# Patient Record
Sex: Male | Born: 1950 | Race: White | Hispanic: No | Marital: Single | Smoking: Former smoker
Health system: Southern US, Community
[De-identification: ages and names within clinical notes are randomized; demographics above are authoritative.]

## PROBLEM LIST (undated history)

## (undated) DIAGNOSIS — I10 Essential (primary) hypertension: Secondary | ICD-10-CM

## (undated) DIAGNOSIS — F209 Schizophrenia, unspecified: Secondary | ICD-10-CM

## (undated) DIAGNOSIS — B1920 Unspecified viral hepatitis C without hepatic coma: Secondary | ICD-10-CM

## (undated) DIAGNOSIS — F32A Depression, unspecified: Secondary | ICD-10-CM

## (undated) DIAGNOSIS — R413 Other amnesia: Secondary | ICD-10-CM

## (undated) DIAGNOSIS — F329 Major depressive disorder, single episode, unspecified: Secondary | ICD-10-CM

## (undated) DIAGNOSIS — F99 Mental disorder, not otherwise specified: Secondary | ICD-10-CM

## (undated) DIAGNOSIS — F319 Bipolar disorder, unspecified: Secondary | ICD-10-CM

## (undated) DIAGNOSIS — F259 Schizoaffective disorder, unspecified: Secondary | ICD-10-CM

## (undated) DIAGNOSIS — R569 Unspecified convulsions: Secondary | ICD-10-CM

## (undated) HISTORY — DX: Other amnesia: R41.3

---

## 2002-02-14 ENCOUNTER — Inpatient Hospital Stay (HOSPITAL_COMMUNITY): Admission: EM | Admit: 2002-02-14 | Discharge: 2002-02-26 | Payer: Self-pay | Admitting: Psychiatry

## 2002-06-05 ENCOUNTER — Inpatient Hospital Stay (HOSPITAL_COMMUNITY): Admission: EM | Admit: 2002-06-05 | Discharge: 2002-06-08 | Payer: Self-pay | Admitting: Psychiatry

## 2010-03-12 ENCOUNTER — Emergency Department: Payer: Self-pay | Admitting: Internal Medicine

## 2010-03-21 ENCOUNTER — Emergency Department: Payer: Self-pay | Admitting: Emergency Medicine

## 2010-03-22 ENCOUNTER — Emergency Department: Payer: Self-pay | Admitting: Emergency Medicine

## 2011-06-09 ENCOUNTER — Emergency Department (HOSPITAL_COMMUNITY)
Admission: EM | Admit: 2011-06-09 | Discharge: 2011-06-09 | Disposition: A | Discharge status: Home / Self Care | Payer: Medicare Other | Attending: Emergency Medicine | Admitting: Emergency Medicine

## 2011-06-09 DIAGNOSIS — F329 Major depressive disorder, single episode, unspecified: Secondary | ICD-10-CM | POA: Insufficient documentation

## 2011-06-09 DIAGNOSIS — F3289 Other specified depressive episodes: Secondary | ICD-10-CM | POA: Insufficient documentation

## 2011-06-09 LAB — COMPREHENSIVE METABOLIC PANEL
ALT: 40 U/L (ref 0–53)
Alkaline Phosphatase: 73 U/L (ref 39–117)
CO2: 23 mEq/L (ref 19–32)
Chloride: 103 mEq/L (ref 96–112)
GFR calc Af Amer: >90 mL/min (ref >90)
GFR calc non Af Amer: 90 mL/min — ABNORMAL LOW (ref >90)
Glucose, Bld: 107 mg/dL — ABNORMAL HIGH (ref 70–99)
Potassium: 3.5 mEq/L (ref 3.5–5.1)
Sodium: 137 mEq/L (ref 135–145)
Total Protein: 8 g/dL (ref 6.0–8.3)

## 2011-06-09 LAB — CBC
Hemoglobin: 14.2 g/dL (ref 13.0–17.0)
RBC: 4.39 MIL/uL (ref 4.22–5.81)
WBC: 9 10*3/uL (ref 4.0–10.5)

## 2011-06-09 LAB — DIFFERENTIAL
Basophils Absolute: 0 10*3/uL (ref 0.0–0.1)
Basophils Relative: 0 % (ref 0–1)
Neutro Abs: 6.4 10*3/uL (ref 1.7–7.7)
Neutrophils Relative %: 72 % (ref 43–77)

## 2011-06-19 ENCOUNTER — Emergency Department (HOSPITAL_COMMUNITY): Payer: Medicare Other

## 2011-06-19 ENCOUNTER — Observation Stay (HOSPITAL_COMMUNITY)
Admission: EM | Admit: 2011-06-19 | Discharge: 2011-06-20 | Disposition: A | Discharge status: Home / Self Care | Payer: Medicare Other | Attending: Emergency Medicine | Admitting: Emergency Medicine

## 2011-06-19 DIAGNOSIS — R079 Chest pain, unspecified: Principal | ICD-10-CM | POA: Insufficient documentation

## 2011-06-19 LAB — COMPREHENSIVE METABOLIC PANEL
Albumin: 3.9 g/dL (ref 3.5–5.2)
BUN: 11 mg/dL (ref 6–23)
Chloride: 109 mEq/L (ref 96–112)
Creatinine, Ser: 0.92 mg/dL (ref 0.50–1.35)
GFR calc Af Amer: >90 mL/min (ref >90)
GFR calc non Af Amer: 90 mL/min — ABNORMAL LOW (ref >90)
Total Bilirubin: 0.4 mg/dL (ref 0.3–1.2)

## 2011-06-19 LAB — CBC
Hemoglobin: 12.7 g/dL — ABNORMAL LOW (ref 13.0–17.0)
MCH: 31.3 pg (ref 26.0–34.0)
MCHC: 34.1 g/dL (ref 30.0–36.0)

## 2011-06-19 LAB — POCT I-STAT TROPONIN I: Troponin i, poc: 0.01 ng/mL (ref 0.00–0.08)

## 2011-06-19 LAB — D-DIMER, QUANTITATIVE: D-Dimer, Quant: 0.67 ug/mL-FEU — ABNORMAL HIGH (ref 0.00–0.48)

## 2011-06-20 ENCOUNTER — Encounter (HOSPITAL_COMMUNITY): Payer: Self-pay | Admitting: Radiology

## 2011-06-20 ENCOUNTER — Observation Stay (HOSPITAL_COMMUNITY): Payer: Medicare Other

## 2011-06-20 DIAGNOSIS — R079 Chest pain, unspecified: Secondary | ICD-10-CM

## 2011-06-20 LAB — CK TOTAL AND CKMB (NOT AT ARMC): Total CK: 171 U/L (ref 7–232)

## 2011-06-20 LAB — TROPONIN I: Troponin I: <0.3 ng/mL (ref <0.30)

## 2011-06-20 MED ORDER — IOHEXOL 350 MG/ML SOLN
100.0000 mL | Freq: Once | INTRAVENOUS | Status: AC | PRN
  Administered 2011-06-20: 100 mL via INTRAVENOUS

## 2011-06-24 NOTE — Consult Note (Signed)
Daryl Campbell, Daryl Campbell NO.:  [id number]  MEDICAL RECORD NO.:  [id number]  LOCATION:  1899                         FACILITY:  [facility]  PHYSICIAN:  Jake Bathe, MD      DATE OF BIRTH:  1951-08-17  DATE OF CONSULTATION:  06/20/2011 DATE OF DISCHARGE:                                CONSULTATION   REASON FOR CONSULTATION:  Evaluation of chest pain.  HISTORY OF PRESENT ILLNESS:  Daryl Campbell is a 60 year old male whose father had a myocardial infarction at age 46 with a underlying seizure disorder, smoker, nondiabetic, no hypertension who reported to the emergency division last evening after having episodic chest discomfort lasting approximately 15 minutes in duration while tossing and turning in bed that was sharp in character and localized to the substernal region.  He underwent the routine CDU protocol and had normal cardiac markers and went to the stress echo lab where he was unable to perform the treadmill test due to issues with coordination.  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 His current condition is back to his usual state of health.  He is not complaining of any discomfort or any chest discomfort at this time.  He has no fevers, chills, nausea, and vomiting.  He did admit to smoking a pack a day of cigarettes which he admits to going to try to quit. Oxygen saturations were normal.  PAST MEDICAL HISTORY:  As above.  MEDICATIONS:  Klonopin, Tegretol, and Zoloft.  FAMILY HISTORY:  Father MI, 51.  SOCIAL HISTORY:  Smoker, pack a day.  No alcohol.  No drug use.  Lives in nursing facility.  REVIEW OF SYSTEMS:  Unless specified above, all other 12 review of systems negative.  PHYSICAL EXAMINATION:  VITAL SIGNS:  Blood pressure 105/71, pulse 64, respirations 18, saturating 96% on room air. GENERAL:  Alert and oriented x3.  No acute distress, pleasant, jovial, and hungry. EYES:  Well-perfused  conjunctivae.  EOMI.  No scleral icterus. NECK:  Supple.  No lymphadenopathy.  No thyromegaly.  No carotid bruits. No JVD. CARDIOVASCULAR:  Regular rate and rhythm without any murmurs, rubs, or gallops. LUNGS:  Clear to auscultation bilaterally.  Normal respiratory effort. ABDOMEN:  Soft, nontender, and normoactive bowel sounds.  No rebound or guarding. EXTREMITIES:  No clubbing, cyanosis, or edema.  Normal distal pulses. GU:  Deferred. RECTAL:  Deferred. NEUROLOGIC:  Nonfocal.  No tremors are noted. PSYCH:  Appears quite pleasant, alert, understands condition.  LABORATORY DATA:  EKG personally viewed shows sinus rhythm, rate 65 with subtle nonspecific T-wave inversion in lead V4, V3, and some flattening in V5 as well as inferior leads.  When compared to EKG yesterday, there was flattening of ST segments and/or T-waves in V2 and V3, but no subtle inversion.  These T-wave changes are very nonspecific and do not appear to be ischemic in origin.  First-degree AV block is noted.  PR interval 224 on second EKG with a heart rate of 50.  CTA of chest was done to rule out PE and there was no pulmonary embolus but there was some coronary artery calcifications seen in  the circumflex artery as well as likely LAD.  All cardiac markers are negative.  Chemistry is unremarkable.  D-dimer was elevated prompting exam.  Hemoglobin mildly low at 12.7, platelet count 231, and creatinine 0.9.  Liver functions are normal.  ASSESSMENT AND PLAN:  This is a 60 year old male with family history of coronary artery disease, smoker with atypical chest discomfort. 1. Atypical chest discomfort - this was occurring while turning in his     bed last night.  He did push mowed the lawn yesterday without any     difficulty.  I wonder if there is a musculoskeletal component to     this.  His cardiac markers are negative and reassuring.  EKG is     reassuring.  Subtle nonspecific ST changes.  He does, however, have      coronary calcium on his CT of the chest.  I would like to see him     back in the clinic to monitor his symptoms and to determine whether     or not he needs any further noninvasive imaging stress test.  Of     course, he knows to contact us if any further worrisome symptoms     develop.  At this point, I do feel comfortable allowing him go home     with tobacco cessation, aspirin, and continue to modify all risk     factors.  Once again, troponin is negative.  He is eager to go home     as well.  We will see him back in followup in approximately 1 week.     Jake Bathe, MD     MCS/MEDQ  D:  06/20/2011  T:  06/20/2011  Job:  [id number]  Electronically Signed by Donato Schultz MD on 06/24/2011 07:58:39 AM

## 2011-09-20 ENCOUNTER — Encounter (HOSPITAL_COMMUNITY): Payer: Self-pay | Admitting: *Deleted

## 2011-09-20 ENCOUNTER — Emergency Department (HOSPITAL_COMMUNITY): Payer: Medicare Other

## 2011-09-20 ENCOUNTER — Inpatient Hospital Stay (HOSPITAL_COMMUNITY)
Admission: EM | Admit: 2011-09-20 | Discharge: 2011-09-24 | DRG: 101 | Payer: Medicare Other | Attending: Infectious Diseases | Admitting: Infectious Diseases

## 2011-09-20 ENCOUNTER — Other Ambulatory Visit: Payer: Self-pay

## 2011-09-20 ENCOUNTER — Emergency Department (HOSPITAL_COMMUNITY)
Admission: EM | Admit: 2011-09-20 | Discharge: 2011-09-20 | Disposition: A | Discharge status: Home / Self Care | Payer: Medicare Other | Source: Home / Self Care | Attending: Emergency Medicine | Admitting: Emergency Medicine

## 2011-09-20 DIAGNOSIS — Z79899 Other long term (current) drug therapy: Secondary | ICD-10-CM

## 2011-09-20 DIAGNOSIS — Z7982 Long term (current) use of aspirin: Secondary | ICD-10-CM | POA: Insufficient documentation

## 2011-09-20 DIAGNOSIS — I10 Essential (primary) hypertension: Secondary | ICD-10-CM | POA: Insufficient documentation

## 2011-09-20 DIAGNOSIS — F319 Bipolar disorder, unspecified: Secondary | ICD-10-CM | POA: Diagnosis present

## 2011-09-20 DIAGNOSIS — Z9119 Patient's noncompliance with other medical treatment and regimen: Secondary | ICD-10-CM

## 2011-09-20 DIAGNOSIS — IMO0001 Reserved for inherently not codable concepts without codable children: Secondary | ICD-10-CM | POA: Insufficient documentation

## 2011-09-20 DIAGNOSIS — Z6828 Body mass index (BMI) 28.0-28.9, adult: Secondary | ICD-10-CM

## 2011-09-20 DIAGNOSIS — R569 Unspecified convulsions: Secondary | ICD-10-CM

## 2011-09-20 DIAGNOSIS — Z91199 Patient's noncompliance with other medical treatment and regimen due to unspecified reason: Secondary | ICD-10-CM

## 2011-09-20 DIAGNOSIS — R4182 Altered mental status, unspecified: Secondary | ICD-10-CM

## 2011-09-20 DIAGNOSIS — G40909 Epilepsy, unspecified, not intractable, without status epilepticus: Principal | ICD-10-CM | POA: Diagnosis present

## 2011-09-20 DIAGNOSIS — T65891A Toxic effect of other specified substances, accidental (unintentional), initial encounter: Secondary | ICD-10-CM | POA: Insufficient documentation

## 2011-09-20 DIAGNOSIS — F209 Schizophrenia, unspecified: Secondary | ICD-10-CM | POA: Diagnosis present

## 2011-09-20 DIAGNOSIS — E875 Hyperkalemia: Secondary | ICD-10-CM | POA: Diagnosis present

## 2011-09-20 DIAGNOSIS — F203 Undifferentiated schizophrenia: Secondary | ICD-10-CM

## 2011-09-20 DIAGNOSIS — B1920 Unspecified viral hepatitis C without hepatic coma: Secondary | ICD-10-CM | POA: Diagnosis present

## 2011-09-20 HISTORY — DX: Essential (primary) hypertension: I10

## 2011-09-20 HISTORY — DX: Mental disorder, not otherwise specified: F99

## 2011-09-20 HISTORY — DX: Unspecified convulsions: R56.9

## 2011-09-20 HISTORY — DX: Unspecified viral hepatitis C without hepatic coma: B19.20

## 2011-09-20 LAB — CBC
HCT: 44.1 % (ref 39.0–52.0)
Hemoglobin: 15.2 g/dL (ref 13.0–17.0)
MCH: 32.1 pg (ref 26.0–34.0)
MCHC: 34.5 g/dL (ref 30.0–36.0)
MCV: 93.2 fL (ref 78.0–100.0)

## 2011-09-20 LAB — BASIC METABOLIC PANEL
BUN: 12 mg/dL (ref 6–23)
BUN: 10 mg/dL (ref 6–23)
Calcium: 9.7 mg/dL (ref 8.4–10.5)
Creatinine, Ser: 1.03 mg/dL (ref 0.50–1.35)
Creatinine, Ser: 0.84 mg/dL (ref 0.50–1.35)
GFR calc Af Amer: >90 mL/min (ref >90)
GFR calc non Af Amer: 77 mL/min — ABNORMAL LOW (ref >90)
GFR calc non Af Amer: >90 mL/min (ref >90)
Glucose, Bld: 159 mg/dL — ABNORMAL HIGH (ref 70–99)

## 2011-09-20 LAB — RAPID URINE DRUG SCREEN, HOSP PERFORMED
Barbiturates: NOT DETECTED
Barbiturates: NOT DETECTED
Benzodiazepines: NOT DETECTED
Cocaine: NOT DETECTED
Tetrahydrocannabinol: NOT DETECTED

## 2011-09-20 MED ORDER — LITHIUM CARBONATE ER 300 MG PO TBCR
300.0000 mg | EXTENDED_RELEASE_TABLET | Freq: Once | ORAL | Status: AC
  Administered 2011-09-20: 300 mg via ORAL
  Filled 2011-09-20: qty 1

## 2011-09-20 MED ORDER — ASPIRIN EC 81 MG PO TBEC
81.0000 mg | DELAYED_RELEASE_TABLET | Freq: Every day | ORAL | Status: DC
  Administered 2011-09-20 – 2011-09-24 (×5): 81 mg via ORAL
  Filled 2011-09-20 (×5): qty 1

## 2011-09-20 MED ORDER — ENOXAPARIN SODIUM 40 MG/0.4ML ~~LOC~~ SOLN
40.0000 mg | SUBCUTANEOUS | Status: DC
  Administered 2011-09-20 – 2011-09-23 (×4): 40 mg via SUBCUTANEOUS
  Filled 2011-09-20 (×5): qty 0.4

## 2011-09-20 MED ORDER — ADULT MULTIVITAMIN W/MINERALS CH
1.0000 | ORAL_TABLET | Freq: Every day | ORAL | Status: DC
  Administered 2011-09-20 – 2011-09-24 (×5): 1 via ORAL
  Filled 2011-09-20 (×5): qty 1

## 2011-09-20 MED ORDER — LITHIUM CARBONATE ER 300 MG PO TBCR
300.0000 mg | EXTENDED_RELEASE_TABLET | Freq: Two times a day (BID) | ORAL | Status: DC
  Administered 2011-09-20 – 2011-09-22 (×3): 300 mg via ORAL
  Filled 2011-09-20 (×9): qty 1

## 2011-09-20 MED ORDER — LORAZEPAM 2 MG/ML IJ SOLN
2.0000 mg | Freq: Once | INTRAMUSCULAR | Status: AC
  Administered 2011-09-20: 2 mg via INTRAVENOUS
  Filled 2011-09-20: qty 1

## 2011-09-20 MED ORDER — POLYETHYLENE GLYCOL 3350 17 GM/SCOOP PO POWD
17.0000 g | Freq: Every day | ORAL | Status: DC | PRN
  Filled 2011-09-20: qty 255

## 2011-09-20 MED ORDER — LORAZEPAM 2 MG/ML IJ SOLN
INTRAMUSCULAR | Status: AC
  Filled 2011-09-20: qty 1

## 2011-09-20 MED ORDER — SERTRALINE HCL 100 MG PO TABS
100.0000 mg | ORAL_TABLET | Freq: Once | ORAL | Status: AC
  Administered 2011-09-20: 100 mg via ORAL
  Filled 2011-09-20: qty 1

## 2011-09-20 MED ORDER — HALOPERIDOL 5 MG PO TABS
5.0000 mg | ORAL_TABLET | Freq: Every day | ORAL | Status: DC
  Administered 2011-09-20 – 2011-09-23 (×4): 5 mg via ORAL
  Filled 2011-09-20 (×5): qty 1

## 2011-09-20 MED ORDER — ADULT MULTIVITAMIN W/MINERALS CH
1.0000 | ORAL_TABLET | Freq: Once | ORAL | Status: DC
  Filled 2011-09-20: qty 1

## 2011-09-20 MED ORDER — FOLIC ACID 1 MG PO TABS
1.0000 mg | ORAL_TABLET | Freq: Once | ORAL | Status: DC
  Filled 2011-09-20: qty 1

## 2011-09-20 MED ORDER — VITAMIN B-1 100 MG PO TABS
100.0000 mg | ORAL_TABLET | Freq: Every day | ORAL | Status: DC
  Administered 2011-09-20 – 2011-09-24 (×5): 100 mg via ORAL
  Filled 2011-09-20 (×5): qty 1

## 2011-09-20 MED ORDER — ONDANSETRON HCL 4 MG/2ML IJ SOLN
4.0000 mg | Freq: Once | INTRAMUSCULAR | Status: AC
  Administered 2011-09-20: 4 mg via INTRAVENOUS
  Filled 2011-09-20: qty 2

## 2011-09-20 MED ORDER — SODIUM CHLORIDE 0.9 % IV SOLN
Freq: Once | INTRAVENOUS | Status: AC
  Administered 2011-09-20: 10:00:00 via INTRAVENOUS

## 2011-09-20 MED ORDER — THIAMINE HCL 100 MG/ML IJ SOLN
Freq: Once | INTRAVENOUS | Status: AC
  Administered 2011-09-20: 21:00:00 via INTRAVENOUS
  Filled 2011-09-20: qty 1000

## 2011-09-20 MED ORDER — POLYETHYLENE GLYCOL 3350 17 G PO PACK
17.0000 g | PACK | Freq: Every day | ORAL | Status: DC | PRN
  Filled 2011-09-20: qty 1

## 2011-09-20 MED ORDER — ASPIRIN 81 MG PO CHEW
CHEWABLE_TABLET | ORAL | Status: AC
  Administered 2011-09-20: 81 mg
  Filled 2011-09-20: qty 1

## 2011-09-20 MED ORDER — SODIUM CHLORIDE 0.9 % IV SOLN
INTRAVENOUS | Status: DC
  Administered 2011-09-20: 22:00:00 via INTRAVENOUS

## 2011-09-20 MED ORDER — THERA M PLUS PO TABS: 1.0000 | ORAL_TABLET | Freq: Every day | ORAL | Status: DC

## 2011-09-20 MED ORDER — LORAZEPAM 2 MG/ML IJ SOLN
INTRAMUSCULAR | Status: AC
  Administered 2011-09-20: 02:00:00
  Filled 2011-09-20: qty 1

## 2011-09-20 MED ORDER — SERTRALINE HCL 100 MG PO TABS
100.0000 mg | ORAL_TABLET | Freq: Every day | ORAL | Status: DC
  Administered 2011-09-20 – 2011-09-24 (×5): 100 mg via ORAL
  Filled 2011-09-20 (×5): qty 1

## 2011-09-20 MED ORDER — LORAZEPAM 2 MG/ML IJ SOLN
1.0000 mg | Freq: Once | INTRAMUSCULAR | Status: AC
  Administered 2011-09-20: 1 mg via INTRAVENOUS
  Filled 2011-09-20: qty 1

## 2011-09-20 MED ORDER — LEVETIRACETAM 500 MG PO TABS
500.0000 mg | ORAL_TABLET | Freq: Once | ORAL | Status: AC
  Administered 2011-09-20: 500 mg via ORAL
  Filled 2011-09-20: qty 1

## 2011-09-20 MED ORDER — LORAZEPAM 2 MG/ML IJ SOLN
1.0000 mg | Freq: Four times a day (QID) | INTRAMUSCULAR | Status: AC | PRN
  Administered 2011-09-20 – 2011-09-21 (×3): 1 mg via INTRAVENOUS
  Filled 2011-09-20 (×3): qty 1

## 2011-09-20 MED ORDER — POTASSIUM CHLORIDE CRYS ER 20 MEQ PO TBCR
40.0000 meq | EXTENDED_RELEASE_TABLET | Freq: Once | ORAL | Status: AC
  Administered 2011-09-20: 40 meq via ORAL
  Filled 2011-09-20: qty 2

## 2011-09-20 MED ORDER — LORAZEPAM 0.5 MG PO TABS: 1.0000 mg | ORAL_TABLET | Freq: Four times a day (QID) | ORAL | Status: AC | PRN

## 2011-09-20 MED ORDER — FOLIC ACID 1 MG PO TABS
1.0000 mg | ORAL_TABLET | Freq: Every day | ORAL | Status: DC
  Administered 2011-09-20 – 2011-09-24 (×5): 1 mg via ORAL
  Filled 2011-09-20 (×5): qty 1

## 2011-09-20 MED ORDER — METOPROLOL TARTRATE 1 MG/ML IV SOLN: 2.5000 mg | INTRAVENOUS | Status: DC | PRN

## 2011-09-20 MED ORDER — CLONAZEPAM 0.5 MG PO TABS
1.0000 mg | ORAL_TABLET | Freq: Every day | ORAL | Status: DC
  Administered 2011-09-20 – 2011-09-23 (×4): 1 mg via ORAL
  Filled 2011-09-20 (×4): qty 2

## 2011-09-20 MED ORDER — THIAMINE HCL 100 MG/ML IJ SOLN
100.0000 mg | Freq: Every day | INTRAMUSCULAR | Status: DC
  Filled 2011-09-20 (×5): qty 1

## 2011-09-20 MED ORDER — ACETAMINOPHEN 325 MG PO TABS
650.0000 mg | ORAL_TABLET | Freq: Two times a day (BID) | ORAL | Status: DC | PRN
  Administered 2011-09-21 – 2011-09-23 (×2): 650 mg via ORAL
  Filled 2011-09-20 (×2): qty 2

## 2011-09-20 MED ORDER — LEVETIRACETAM 500 MG PO TABS
500.0000 mg | ORAL_TABLET | Freq: Two times a day (BID) | ORAL | Status: DC
  Administered 2011-09-20 – 2011-09-24 (×4): 500 mg via ORAL
  Filled 2011-09-20 (×9): qty 1

## 2011-09-20 NOTE — ED Notes (Signed)
D/c instructions reviewed w/ Wynetta Fines, caregiver at [facility]. Advised to attempt to keep pt away from glue or paint. Caregiver denies any further questions or concerns at present.

## 2011-09-20 NOTE — ED Notes (Addendum)
Patient with additional seizure at this time, tonic/clonic, patient with loss of bladder control with seizure activity. MD notified.

## 2011-09-20 NOTE — ED Notes (Signed)
Pt had a desat to the mid 80's (SpO2).  Per Dr. Dierdre Highman, pt was given O2 via ambu-bag and RN placed nasal airway.  Pt SpO2 is currently 100% on NRB.

## 2011-09-20 NOTE — ED Notes (Signed)
Per EMS pt reported to have been huffing paint - [employer] reports pt noted to huffing glue - on arrival to EMS bay pt reported to have grand mal seizure - pt postictal on arrival - unresponsive to painful stimuli.

## 2011-09-20 NOTE — ED Notes (Signed)
Pt arrives by EMS-called to pt's residence by roommates-pt huffed 3 cans spray paint-had altered LOC-on arrival to ambulance bay-pt sat up pulling at wires and seized-pt is currently post ictal and on 100% NRB-roommate told EMS that pt has not been taking his meds/lives in a group home

## 2011-09-20 NOTE — ED Notes (Signed)
Pt allowed to shower, changed into blue scrubs

## 2011-09-20 NOTE — ED Notes (Signed)
Pts care facility, [facility] administrator would like a call back from the nurse regarding this pt.  Her name is Daryl Campbell [phone].

## 2011-09-20 NOTE — ED Provider Notes (Addendum)
History     CSN: [id number]  Arrival date & time 09/20/11  2440   First MD Initiated Contact with Patient 09/20/11 [phone]      Chief Complaint  Patient presents with  . Seizures    (Consider location/radiation/quality/duration/timing/severity/associated sxs/prior treatment) HPI Comments: Patient was just seen at [facility] and discharged after an apparent seizure that was thought to be due to huffing paint.  He returns after experiencing another seizure.  The patient is post-ictal and does not add much additional history.  He apparently lives in some sort of group home.  Patient is a 61 y.o. male presenting with seizures. The history is provided by the patient, the EMS personnel and medical records.  Seizures  This is a recurrent problem. The current episode started less than 1 hour ago. There were 2 to 3 seizures. The most recent episode lasted 2 to 5 minutes. Associated symptoms include sleepiness and confusion. Characteristics include rhythmic jerking. Characteristics do not include bladder incontinence. The seizure(s) had no focality. There has been no fever.    Past Medical History  Diagnosis Date  . Hypertension   . Psychiatric disorder   . Hepatitis C   . Seizures     No past surgical history on file.  No family history on file.  History  Substance Use Topics  . Smoking status: Not on file  . Smokeless tobacco: Not on file  . Alcohol Use:       Review of Systems  Unable to perform ROS Genitourinary: Negative for bladder incontinence.  Neurological: Positive for seizures.  Psychiatric/Behavioral: Positive for confusion.    Allergies  Review of patient's allergies indicates no known allergies.  Home Medications   Current Outpatient Rx  Name Route Sig Dispense Refill  . ACETAMINOPHEN 325 MG PO TABS Oral Take 650 mg by mouth 2 (two) times daily as needed.    . ASPIRIN EC 81 MG PO TBEC Oral Take 81 mg by mouth daily.    Marland Kitchen CLONAZEPAM 1 MG PO TABS Oral Take 1 mg by  mouth at bedtime.    Marland Kitchen HALOPERIDOL 5 MG PO TABS Oral Take 5 mg by mouth at bedtime.    Marland Kitchen LEVETIRACETAM 500 MG PO TABS Oral Take 500 mg by mouth 2 (two) times daily.    Marland Kitchen LITHIUM CARBONATE PO Oral Take 200 mg by mouth 3 (three) times daily. 1 tablet in the morning and 2 at bedtime    . THERA M PLUS PO TABS Oral Take 1 tablet by mouth daily.    Marland Kitchen POLYETHYLENE GLYCOL 3350 PO POWD Oral Take 17 g by mouth daily as needed.    . SERTRALINE HCL 100 MG PO TABS Oral Take 100 mg by mouth daily.      SpO2 99%  Physical Exam  Nursing note and vitals reviewed. Constitutional: He appears well-developed and well-nourished. No distress.  HENT:  Head: Normocephalic and atraumatic.  Right Ear: External ear normal.  Left Ear: External ear normal.  Mouth/Throat: Oropharynx is clear and moist.  Eyes: EOM are normal. Pupils are equal, round, and reactive to light.  Neck: Normal range of motion. Neck supple.  Cardiovascular: Normal rate and regular rhythm.   No murmur heard. Pulmonary/Chest: Effort normal and breath sounds normal. No respiratory distress. He has no wheezes.  Abdominal: Soft. Bowel sounds are normal. He exhibits no distension. There is no tenderness.  Musculoskeletal: Normal range of motion. He exhibits no edema.  Lymphadenopathy:    He has no cervical adenopathy.  Neurological:       Patient appears to be post-ictal.  He is somewhat confused and does not respond appropriately to commands.  Skin: Skin is warm and dry.    ED Course  Procedures (including critical care time)   Labs Reviewed  BASIC METABOLIC PANEL  LITHIUM LEVEL   No results found.   No diagnosis found.   Date: 09/20/2011  Rate: 101  Rhythm: sinus tachycardia  QRS Axis: normal  Intervals: normal  ST/T Wave abnormalities: normal  Conduction Disutrbances:none  Narrative Interpretation:   Old EKG Reviewed: unchanged    MDM  This patient has a history of seizures, mr, psychiatric problems, and substance  abuse issues.  He has been off of his meds for the past month and is now having recurrent seizures.  He is improving now but still appears to be post-ictal and is in no way well enough to be on his own.  I have spoken with neurology who recommends starting keppra back up at 1000 mg now, then 500 twice daily as before.  I have also consulted the teaching service to discuss medical admission.        Geoffery Lyons, MD 09/20/11 1606  Geoffery Lyons, MD 09/20/11 [phone]

## 2011-09-20 NOTE — ED Notes (Signed)
Patient with recent seizure activity requiring treatment at [facility] ED.  Patient d/c at approx. 0600 this am, patient with additional seizure this morning and transported via EMS here to ED.  Patient with tonic/clonic seizure upon arrival, no loss of bladder or bowel associated with this seizure.  EMS reports n/v at home prior to transport.

## 2011-09-20 NOTE — H&P (Signed)
Hospital Admission Note Date: 09/20/2011  Patient name: Daryl Campbell Medical record number: [id number] Date of birth: 1951-05-31 Age: 61 y.o. Gender: male PCP: No primary provider on file.  Medical Service:  Attending:  Dr. Maurice Campbell  First Contact: Dr. Clyde Campbell Pager: [phone] Second Contact:Dr. Allena Campbell [phone]  After 5 pm or weekends: 1st Contact: Pager: [phone] 2nd Contact: Pager: [phone]   Chief Complaint: seizure  History of Present Illness:   Patient is 81 man with PMH of hypertension, schizophrenia, seizure disorder and HCV, who present with seizure. Patient is very confused and does not answer any questions appropriately, just mumbling when asked. So the history was obtained by calling the group home manager (Daryl Campbell) and also from ED note.  Per Daryl Campbell, patient is resident of group home (Named Daryl Campbell). Patient refused to take his home medications (including Lithium, Zoloft, Haldol and keppra) in the past week. He developed seizure at 12:00 last night which was witnessed by Daryl Campbell, who described that patient had jerking movement all over his body for about 5 min. Patient did not fall, did not injure any parts of his body, did not bite his tongue. But he had urinary incontinence. After the seizure episode, patient looked confused. EMS was called and took him to the [facility]. It was thought that patient's seizure is due to huffing paint and discharged from [facility]. After discharged, patient was very confused and developed seizures again at morning. He was taken to ED in the morning again.   No other history was provided.   PMH:   . Hypertension  . Psychiatric disorder  . Hepatitis C  . Seizures    Home Medications (not complete) Lithium,  Zoloft,  Haldol  Keppra  Meds at ED  Allergies: not known  Social Hx: single, lives in group home, others not known   Review of Systems: Per HPI   Physical Exam:   Blood pressure 172/90, pulse 101, temperature 99 F  (37.2 C), temperature source Oral, resp. rate 18, SpO2 95.00%.    Vitals: T: 99      HR: 101      BP: 172/90      RR:18       O2 saturation:  95% at RA  General: resting in bed, not in acute distress. He appears to be post-ictal, very confused, does not respond appropriately to commands.  HEENT: PERRL, EOMI, no scleral icterus Cardiac: S1/S2, RRR, No murmurs, gallops or rubs Pulm: Good air movement bilaterally, Clear to auscultation bilaterally, No rales, wheezing, rhonchi or rubs. Abd: Soft,  nondistended, nontender, no rebound pain, no organomegaly, BS present Ext: No rashes or edema, 2+DP/PT pulse bilaterally Neuro: not oriented X 3, no facial droopy, 2+ brachial and knee reflex, moves all extremities. Babinski's sign negative.    Lab results: Basic Metabolic Panel:  Basename 09/20/11 1002 09/20/11 0222  NA 145 140  K 3.1* 3.1*  CL 106 101  CO2 19 9*  GLUCOSE 197* 159*  BUN 10 12  CREATININE 0.84 1.03  CALCIUM 9.4 9.7  MG -- --  PHOS -- --   CBC:  Basename 09/20/11 0222  WBC 10.1  NEUTROABS --  HGB 15.2  HCT 44.1  MCV 93.2  PLT 233   Cardiac Enzymes:  Basename 09/20/11 1002  CKTOTAL 380*  CKMB --  CKMBINDEX --  TROPONINI --    Drugs of Abuse     Component Value Date/Time   LABOPIA NONE DETECTED 09/20/2011 1050   COCAINSCRNUR  NONE DETECTED 09/20/2011 1050   LABBENZ NONE DETECTED 09/20/2011 1050   AMPHETMU NONE DETECTED 09/20/2011 1050   THCU NONE DETECTED 09/20/2011 1050   LABBARB NONE DETECTED 09/20/2011 1050    Alcohol Level:  Basename 09/20/11 1002  ETH <11   Imaging results:   Ct Head Wo Contrast 09/20/2011  *RADIOLOGY REPORT*  Clinical Data: 61 year old male with altered level of consciousness, seizures.  CT HEAD WITHOUT CONTRAST  Technique:  Contiguous axial images were obtained from the base of the skull through the vertex without contrast.  Comparison: None.  Findings: Visualized paranasal sinuses and mastoids are clear. Visualized orbits and  scalp soft tissues are within normal limits. Mild calcified atherosclerosis at the skull base.  Cerebral volume is within normal limits for age.  No midline shift, ventriculomegaly, mass effect, evidence of mass lesion, intracranial hemorrhage or evidence of cortically based acute infarction.  Gray-Campbell matter differentiation is within normal limits throughout the brain.  No suspicious intracranial vascular hyperdensity.  Scattered dural calcifications.  IMPRESSION: Normal noncontrast CT appearance of the brain for age.  Original Report Authenticated By: Daryl Campbell, M.D.   WJX:BJYNW rhythm,  Normal axis, occasional premature beat. Normal QT interval. No ischemic change.  Assessment & Plan by Problem:  # Seizure disorder: most likely due to noncompliance to medications. Patient refused to take his home medications in the past weeks and then developed seizure. Other differential diagnosis include, but less likely, drug abuse (less likely, his UDS is negative), stroke (unlikely, patient does not weakness or focal deficit), electrolyte abnormality (he has mild hyperkalemia, K 3.1,  which should not induce seizure) and head injury (CT-hear normal), arrythmia (EKG  Has no significant arrhythmia). Another possibility is alcohol withdraw, which is not clear now.   -patient is admited to SDU -put pt on seizure precaution.  -start Keppra and give clonopin -will check CMP -will repeat EKG in the AM -CIWA protolol for possible alcohol withdraw. -will get medical record at AM from group home manager (Daryl Campbell, [phone])  # Hypertension: bp is 172/90.  -will start metoprol   # Schizophrenia:  -will start Haldol, Lithim and Zofolt  # DVT PPX; lovenox   Signed: Lorretta Campbell 09/20/2011, 6:15 PM  Internal Medicine Teaching Service Attending Note Date: 09/21/2011  Patient name: Daryl Campbell  Medical record number: [id number]  Date of birth: 1950/12/09   I have seen and evaluated Daryl Campbell  and discussed their care with the Residency Team.   Physical Exam: Blood pressure 135/95, pulse 76, temperature 97.7 F (36.5 C), temperature source Oral, resp. rate 20, height 5\' 8"  (1.727 m), weight 184 lb 15.5 oz (83.9 kg), SpO2 98.00%.   Lab results: Results for orders placed during the hospital encounter of 09/20/11 (from the past 24 hour(s))  MRSA PCR SCREENING     Status: Normal   Collection Time   09/20/11  8:24 PM      Component Value Range   MRSA by PCR NEGATIVE  NEGATIVE   COMPREHENSIVE METABOLIC PANEL     Status: Abnormal   Collection Time   09/21/11  4:40 AM      Component Value Range   Sodium 142  135 - 145 (mEq/L)   Potassium 4.1  3.5 - 5.1 (mEq/L)   Chloride 110  96 - 112 (mEq/L)   CO2 25  19 - 32 (mEq/L)   Glucose, Bld 103 (*) 70 - 99 (mg/dL)   BUN 11  6 - 23 (mg/dL)  Creatinine, Ser 0.94  0.50 - 1.35 (mg/dL)   Calcium 9.8  8.4 - 16.1 (mg/dL)   Total Protein 7.4  6.0 - 8.3 (g/dL)   Albumin 3.9  3.5 - 5.2 (g/dL)   AST 57 (*) 0 - 37 (U/L)   ALT 42  0 - 53 (U/L)   Alkaline Phosphatase 61  39 - 117 (U/L)   Total Bilirubin 0.8  0.3 - 1.2 (mg/dL)   GFR calc non Af Amer 89 (*) >90 (mL/min)   GFR calc Af Amer >90  >90 (mL/min)  CBC     Status: Normal   Collection Time   09/21/11  4:40 AM      Component Value Range   WBC 8.6  4.0 - 10.5 (K/uL)   RBC 4.32  4.22 - 5.81 (MIL/uL)   Hemoglobin 13.6  13.0 - 17.0 (g/dL)   HCT 09.6  04.5 - 40.9 (%)   MCV 92.4  78.0 - 100.0 (fL)   MCH 31.5  26.0 - 34.0 (pg)   MCHC 34.1  30.0 - 36.0 (g/dL)   RDW 81.1  91.4 - 78.2 (%)   Platelets 173  150 - 400 (K/uL)    Imaging results:  Ct Head Wo Contrast  09/20/2011  *RADIOLOGY REPORT*  Clinical Data: 61 year old male with altered level of consciousness, seizures.  CT HEAD WITHOUT CONTRAST  Technique:  Contiguous axial images were obtained from the base of the skull through the vertex without contrast.  Comparison: None.  Findings: Visualized paranasal sinuses and mastoids are clear.  Visualized orbits and scalp soft tissues are within normal limits. Mild calcified atherosclerosis at the skull base.  Cerebral volume is within normal limits for age.  No midline shift, ventriculomegaly, mass effect, evidence of mass lesion, intracranial hemorrhage or evidence of cortically based acute infarction.  Gray-Campbell matter differentiation is within normal limits throughout the brain.  No suspicious intracranial vascular hyperdensity.  Scattered dural calcifications.  IMPRESSION: Normal noncontrast CT appearance of the brain for age.  Original Report Authenticated By: Harley Hallmark, M.D.    Assessment and Plan: I agree with the formulated Assessment and Plan with the following changes: Mr. Koeller is nonadherent to his medications and thus has recurrent seizures. Will try hard to convince him otherwise when his postictal (ETOH withdrawal?) improves. Lina Sayre

## 2011-09-20 NOTE — ED Provider Notes (Signed)
History     CSN: [id number]  Arrival date & time 09/20/11  0148   First MD Initiated Contact with Patient 09/20/11 0214      Chief Complaint  Patient presents with  . Seizures    pt huffing spray can pain x 3 full cans    (Consider location/radiation/quality/duration/timing/severity/associated sxs/prior treatment) Patient is a 61 y.o. male presenting with seizures. The history is provided by the EMS personnel and the nursing home.  Seizures  This is a recurrent problem. The current episode started less than 1 hour ago. There was 1 seizure. The most recent episode lasted less than 30 seconds. Associated symptoms include sleepiness and confusion. Pertinent negatives include no headaches and no chest pain. Characteristics include loss of consciousness and bit tongue. The episode was witnessed. The seizures continued in the ED. Patient reportedly huffing paint and glue with history of same There has been no fever.   patient lives in a group home and has a long-standing history of huffing paint and glue. He was reportedly huffing paint tonight in [facility] Department was involved. EMS was called and transported patient here and on arrival had a witnessed seizure. He was given IV Ativan and seizure stopped. Patient initially unable to provide any history. After postictal state improved patient admits to huffing paint include tonight. He denies any headache, any fall or trauma and none reported by EMS. Moderate in severity. History of same. Patient is prescribed Keppra for seizures.  Past Medical History  Diagnosis Date  . Hypertension   . Psychiatric disorder   . Hepatitis C     History reviewed. No pertinent past surgical history.  History reviewed. No pertinent family history.  History  Substance Use Topics  . Smoking status: Not on file  . Smokeless tobacco: Not on file  . Alcohol Use:       Review of Systems  Constitutional: Negative for fever and chills.  HENT:  Negative for neck pain and neck stiffness.   Eyes: Negative for pain.  Respiratory: Negative for shortness of breath.   Cardiovascular: Negative for chest pain, palpitations and leg swelling.  Gastrointestinal: Negative for abdominal pain.  Genitourinary: Negative for dysuria.  Musculoskeletal: Negative for back pain.  Skin: Negative for rash.  Neurological: Positive for seizures and loss of consciousness. Negative for headaches.  Psychiatric/Behavioral: Positive for confusion.  All other systems reviewed and are negative.    Allergies  Review of patient's allergies indicates no known allergies.  Home Medications   Current Outpatient Rx  Name Route Sig Dispense Refill  . ACETAMINOPHEN 325 MG PO TABS Oral Take 650 mg by mouth 2 (two) times daily as needed.    . ASPIRIN EC 81 MG PO TBEC Oral Take 81 mg by mouth daily.    Marland Kitchen CLONAZEPAM 1 MG PO TABS Oral Take 1 mg by mouth at bedtime.    Marland Kitchen HALOPERIDOL 5 MG PO TABS Oral Take 5 mg by mouth at bedtime.    Marland Kitchen LEVETIRACETAM 500 MG PO TABS Oral Take 500 mg by mouth 2 (two) times daily.    Marland Kitchen LITHIUM CARBONATE PO Oral Take 200 mg by mouth 3 (three) times daily. 1 tablet in the morning and 2 at bedtime    . THERA M PLUS PO TABS Oral Take 1 tablet by mouth daily.    Marland Kitchen POLYETHYLENE GLYCOL 3350 PO POWD Oral Take 17 g by mouth daily as needed.    . SERTRALINE HCL 100 MG PO TABS Oral Take 100  mg by mouth daily.      BP 180/84  Pulse 100  Temp(Src) 97.7 F (36.5 C) (Oral)  Resp 24  SpO2 100%  Physical Exam  Constitutional: He appears well-developed and well-nourished.  HENT:  Head: Normocephalic and atraumatic.  Eyes: Conjunctivae and EOM are normal. Pupils are equal, round, and reactive to light.  Neck: Trachea normal. Neck supple. No thyromegaly present.  Cardiovascular: Normal rate, regular rhythm, S1 normal, S2 normal and normal pulses.     No systolic murmur is present   No diastolic murmur is present  Pulses:      Radial pulses are  2+ on the right side, and 2+ on the left side.  Pulmonary/Chest: Effort normal and breath sounds normal. He has no wheezes. He has no rhonchi. He has no rales. He exhibits no tenderness.  Abdominal: Soft. Normal appearance and bowel sounds are normal. There is no tenderness. There is no CVA tenderness and negative Murphy's sign.  Musculoskeletal:       BLE:s Calves nontender, no cords or erythema, negative Homans sign  Neurological: He has normal strength. No sensory deficit. GCS eye subscore is 4. GCS verbal subscore is 5. GCS motor subscore is 6.       Mild confusion after reported seizure. Localizes pain.  Skin: Skin is warm and dry. No rash noted. He is not diaphoretic.  Psychiatric: His speech is normal.       Cooperative and appropriate    ED Course  Procedures (including critical care time)  Labs Reviewed  BASIC METABOLIC PANEL - Abnormal; Notable for the following:    Potassium 3.1 (*)    CO2 9 (*)    Glucose, Bld 159 (*)    GFR calc non Af Amer 77 (*)    GFR calc Af Amer 89 (*)    All other components within normal limits  CBC  URINE RAPID DRUG SCREEN (HOSP PERFORMED)    After a seizure arrival, I evaluated patient and nasal airway placed. Patient was placed on monitor with oxygen and observed. After period of post ictal state, patient woke return to baseline mentation. Ambulates emergency department no acute distress. Labs reviewed as above and potassium given for hypokalemia. Records reviewed. Patient get medications at his group home.  MDM  Seizure with history of same. Likely secondary to huffing paint and glue. Patient educated on this and states he understands. No SI or HI. No indication for admit at this time. Patient agreeable to discharge and followup instructions and is stable for discharge back to his group home        Sunnie Nielsen, MD 09/20/11 0630

## 2011-09-20 NOTE — ED Notes (Signed)
[facility] notified of need for transport for pt back to [facility].

## 2011-09-20 NOTE — ED Notes (Signed)
Pt taken a sandwich and fruit cup

## 2011-09-21 DIAGNOSIS — F209 Schizophrenia, unspecified: Secondary | ICD-10-CM

## 2011-09-21 DIAGNOSIS — F319 Bipolar disorder, unspecified: Secondary | ICD-10-CM

## 2011-09-21 LAB — COMPREHENSIVE METABOLIC PANEL
Albumin: 3.9 g/dL (ref 3.5–5.2)
BUN: 11 mg/dL (ref 6–23)
CO2: 25 mEq/L (ref 19–32)
Calcium: 9.8 mg/dL (ref 8.4–10.5)
Chloride: 110 mEq/L (ref 96–112)
Creatinine, Ser: 0.94 mg/dL (ref 0.50–1.35)
GFR calc non Af Amer: 89 mL/min — ABNORMAL LOW (ref >90)
Total Bilirubin: 0.8 mg/dL (ref 0.3–1.2)

## 2011-09-21 LAB — CBC
Hemoglobin: 13.6 g/dL (ref 13.0–17.0)
Platelets: 173 10*3/uL (ref 150–400)
RBC: 4.32 MIL/uL (ref 4.22–5.81)
WBC: 8.6 10*3/uL (ref 4.0–10.5)

## 2011-09-21 NOTE — Progress Notes (Signed)
Call out to M.D. Covering, due to pt non-compliance to bedrest orders (noted on seizure precautions). Pt with  Continued ambulation in and out of room. Constantly digging in trash can and writing notes. Gait unsteady. Reminded pt multiple times regarding safety concerns.Bed alarm placed and RN to sit with pt. Security and the [facility] called to advise pt. Pt with continued removal of cardiac monitor, IV line,and vital sign monitoring equipment and wandering out of room. Two RN's witnessed pt with unsteady gait and confused behavior..Will try B/L wrist restraints to keep pt safe at this time

## 2011-09-21 NOTE — Progress Notes (Signed)
Subjective:  No new episode of seizure overnight. Patient is still confused this AM. He did not answer questions appropriately. Per nurse, patient was non-compliant to the bedrest order in the night. Pt with continued ambulation in and out of room. Constantly digging in trash can and writing notes.   I called the manager of his group home to figure out whether patient has been drinking alcohol. I was told that patient does sniff glue, try to drink aftershave and mouthwash for alcohol.   Objective: Vital signs in last 24 hours: Filed Vitals:   09/20/11 2300 09/21/11 0021 09/21/11 0400 09/21/11 0828  BP: 151/85 146/93 143/86 136/93  Pulse: 88 86 74 61  Temp:  98.3 F (36.8 C) 98.5 F (36.9 C) 98.2 F (36.8 C)  TempSrc:  Oral Oral Oral  Resp:  20 18 20   Height:      Weight:      SpO2:  100% 100% 95%   Weight change:   Intake/Output Summary (Last 24 hours) at 09/21/11 1010 Last data filed at 09/21/11 0647  Gross per 24 hour  Intake    380 ml  Output   1200 ml  Net   -820 ml   Physical Exam: General: resting in bed, not in acute distress. Des not respond appropriately to commands.   HEENT: PERRL, EOMI, no scleral icterus Cardiac: S1/S2, RRR, No murmurs, gallops or rubs Pulm: Good air movement bilaterally, Clear to auscultation bilaterally, No rales, wheezing, rhonchi or rubs. Abd: Soft,  nondistended, nontender, no rebound pain, no organomegaly, BS present Ext: No rashes or edema, 2+DP/PT pulse bilaterally Neuro: not oriented X 3, no facial droopy, 2+ brachial and knee reflex, moves all extremities. Babinski's sign negative.     Lab Results: Basic Metabolic Panel:  Lab 09/21/11 4098 09/20/11 1002  NA 142 145  K 4.1 3.1*  CL 110 106  CO2 25 19  GLUCOSE 103* 197*  BUN 11 10  CREATININE 0.94 0.84  CALCIUM 9.8 9.4  MG -- --  PHOS -- --   Liver Function Tests:  Lab 09/21/11 0440  AST 57*  ALT 42  ALKPHOS 61  BILITOT 0.8  PROT 7.4  ALBUMIN 3.9   CBC:  Lab  09/21/11 0440 09/20/11 0222  WBC 8.6 10.1  NEUTROABS -- --  HGB 13.6 15.2  HCT 39.9 44.1  MCV 92.4 93.2  PLT 173 233   Cardiac Enzymes:  Lab 09/20/11 1002  CKTOTAL 380*  CKMB --  CKMBINDEX --  TROPONINI --   Urine Drug Screen: Drugs of Abuse     Component Value Date/Time   LABOPIA NONE DETECTED 09/20/2011 1050   COCAINSCRNUR NONE DETECTED 09/20/2011 1050   LABBENZ NONE DETECTED 09/20/2011 1050   AMPHETMU NONE DETECTED 09/20/2011 1050   THCU NONE DETECTED 09/20/2011 1050   LABBARB NONE DETECTED 09/20/2011 1050    Alcohol Level:  Lab 09/20/11 1002  ETH <11    Micro Results: Recent Results (from the past 240 hour(s))  MRSA PCR SCREENING     Status: Normal   Collection Time   09/20/11  8:24 PM      Component Value Range Status Comment   MRSA by PCR NEGATIVE  NEGATIVE  Final    Studies/Results: Ct Head Wo Contrast  09/20/2011  *RADIOLOGY REPORT*  Clinical Data: 61 year old male with altered level of consciousness, seizures.  CT HEAD WITHOUT CONTRAST  Technique:  Contiguous axial images were obtained from the base of the skull through the vertex without contrast.  Comparison: None.  Findings: Visualized paranasal sinuses and mastoids are clear. Visualized orbits and scalp soft tissues are within normal limits. Mild calcified atherosclerosis at the skull base.  Cerebral volume is within normal limits for age.  No midline shift, ventriculomegaly, mass effect, evidence of mass lesion, intracranial hemorrhage or evidence of cortically based acute infarction.  Gray-white matter differentiation is within normal limits throughout the brain.  No suspicious intracranial vascular hyperdensity.  Scattered dural calcifications.  IMPRESSION: Normal noncontrast CT appearance of the brain for age.  Original Report Authenticated By: Harley Hallmark, M.D.   Medications:  Scheduled Meds:   . sodium chloride   Intravenous Once  . aspirin      . aspirin EC  81 mg Oral Daily  . clonazePAM  1 mg Oral  QHS  . enoxaparin  40 mg Subcutaneous Q24H  . folic acid  1 mg Oral Daily  . haloperidol  5 mg Oral QHS  . levETIRAcetam  500 mg Oral BID  . levETIRAcetam  500 mg Oral Once  . lithium carbonate  300 mg Oral Q12H  . lithium carbonate  300 mg Oral Once  . LORazepam  1 mg Intravenous Once  . LORazepam  2 mg Intravenous Once  . mulitivitamin with minerals  1 tablet Oral Daily  . ondansetron (ZOFRAN) IV  4 mg Intravenous Once  . potassium chloride  40 mEq Oral Once  . sertraline  100 mg Oral Daily  . sertraline  100 mg Oral Once  . general admission iv infusion   Intravenous Once  . thiamine  100 mg Oral Daily   Or  . thiamine  100 mg Intravenous Daily  . DISCONTD: folic acid  1 mg Oral Once  . DISCONTD: mulitivitamin with minerals  1 tablet Oral Once  . DISCONTD: multivitamins ther. w/minerals  1 tablet Oral Daily   Continuous Infusions:   . sodium chloride 100 mL/hr at 09/20/11 2209   PRN Meds:.acetaminophen, LORazepam, LORazepam, metoprolol, polyethylene glycol, DISCONTD: polyethylene glycol powder Assessment/Plan:  # Seizure disorder: most likely due to noncompliance to medications. Patient refused to take his home medications in the past weeks and then developed seizure. Other differential diagnosis include, but less likely, drug abuse (less likely, his UDS is negative), stroke (unlikely, patient does not weakness or focal deficit), electrolyte abnormality (he has mild hyperkalemia, K 3.1,  which should not induce seizure) and head injury (CT-hear normal), arrythmia (EKG  Has no significant arrhythmia). Another possibility is alcohol withdraw. Per his group home manager, patient has been constantly seeking for alcohol, indicating that patient may abuse alcohol in the past. No seizure episode overnight.   -will continue seizure precaution and bedside sitter -continue Keppra and clonopin -CIWA protolol for possible alcohol withdraw.   # Hypertension: bp 172/90 on admission. This  AM his Bp is 136/93  -will continue metoprolol and monitor closely   # Schizophrenia: patient is still behavior abnormally overnight.  -Psychiatry was consulted, will follow up recommendations -will continue Haldol, Lithim and Zofolt  # DVT PPX; lovenox   LOS: 1 day   Lorretta Harp 09/21/2011, 10:10 AM

## 2011-09-21 NOTE — Consult Note (Signed)
Reason for Consult:agitatied, refuses treatment; Schizophrenia Referring Physician: Dr. Louie Bun is an 61 y.o. male.  HPI: Pt lives in group home reportedly had a series of seizures after refusing medications at home ~ one week, including Keppra.  He has a history of huffing paint and glue; admits to huffing day of admission.  He was first seen at [facility] discharged, then brought to [facility] for serial seirues.   Report was consistent with T-C seizures but no urinary incontinence.  He reportedly admitted to staff that he 'faked a seizure'.  Last night he was very agitated and refused to follow directions or take medication.    Past Medical History  Diagnosis Date  . Hypertension   . Psychiatric disorder   . Hepatitis C   . Seizures     History reviewed. No pertinent past surgical history.  History reviewed. No pertinent family history.  Social History:  reports that he has never smoked. He does not have any smokeless tobacco history on file. He reports that he does not drink alcohol or use illicit drugs.  Allergies: No Known Allergies  Medications: I have reviewed the patient's current medications.  Results for orders placed during the hospital encounter of 09/20/11 (from the past 48 hour(s))  BASIC METABOLIC PANEL     Status: Abnormal   Collection Time   09/20/11 10:02 AM      Component Value Range Comment   Sodium 145  135 - 145 (mEq/L)    Potassium 3.1 (*) 3.5 - 5.1 (mEq/L)    Chloride 106  96 - 112 (mEq/L)    CO2 19  19 - 32 (mEq/L)    Glucose, Bld 197 (*) 70 - 99 (mg/dL)    BUN 10  6 - 23 (mg/dL)    Creatinine, Ser 4.09  0.50 - 1.35 (mg/dL)    Calcium 9.4  8.4 - 10.5 (mg/dL)    GFR calc non Af Amer >90  >90 (mL/min)    GFR calc Af Amer >90  >90 (mL/min)   CK     Status: Abnormal   Collection Time   09/20/11 10:02 AM      Component Value Range Comment   Total CK 380 (*) 7 - 232 (U/L)   ETHANOL     Status: Normal   Collection Time   09/20/11 10:02 AM      Component  Value Range Comment   Alcohol, Ethyl (B) <11  0 - 11 (mg/dL)   ACETAMINOPHEN LEVEL     Status: Normal   Collection Time   09/20/11 10:02 AM      Component Value Range Comment   Acetaminophen (Tylenol), Serum <15.0  10 - 30 (ug/mL)   SALICYLATE LEVEL     Status: Abnormal   Collection Time   09/20/11 10:02 AM      Component Value Range Comment   Salicylate Lvl <2.0 (*) 2.8 - 20.0 (mg/dL)   URINE RAPID DRUG SCREEN (HOSP PERFORMED)     Status: Normal   Collection Time   09/20/11 10:50 AM      Component Value Range Comment   Opiates NONE DETECTED  NONE DETECTED     Cocaine NONE DETECTED  NONE DETECTED     Benzodiazepines NONE DETECTED  NONE DETECTED     Amphetamines NONE DETECTED  NONE DETECTED     Tetrahydrocannabinol NONE DETECTED  NONE DETECTED     Barbiturates NONE DETECTED  NONE DETECTED    LITHIUM LEVEL  Status: Abnormal   Collection Time   09/20/11 10:54 AM      Component Value Range Comment   Lithium Lvl <0.25 (*) 0.80 - 1.40 (mEq/L)   MRSA PCR SCREENING     Status: Normal   Collection Time   09/20/11  8:24 PM      Component Value Range Comment   MRSA by PCR NEGATIVE  NEGATIVE    COMPREHENSIVE METABOLIC PANEL     Status: Abnormal   Collection Time   09/21/11  4:40 AM      Component Value Range Comment   Sodium 142  135 - 145 (mEq/L)    Potassium 4.1  3.5 - 5.1 (mEq/L) NO VISIBLE HEMOLYSIS   Chloride 110  96 - 112 (mEq/L)    CO2 25  19 - 32 (mEq/L)    Glucose, Bld 103 (*) 70 - 99 (mg/dL)    BUN 11  6 - 23 (mg/dL)    Creatinine, Ser 4.78  0.50 - 1.35 (mg/dL)    Calcium 9.8  8.4 - 10.5 (mg/dL)    Total Protein 7.4  6.0 - 8.3 (g/dL)    Albumin 3.9  3.5 - 5.2 (g/dL)    AST 57 (*) 0 - 37 (U/L)    ALT 42  0 - 53 (U/L)    Alkaline Phosphatase 61  39 - 117 (U/L)    Total Bilirubin 0.8  0.3 - 1.2 (mg/dL)    GFR calc non Af Amer 89 (*) >90 (mL/min)    GFR calc Af Amer >90  >90 (mL/min)   CBC     Status: Normal   Collection Time   09/21/11  4:40 AM      Component Value Range  Comment   WBC 8.6  4.0 - 10.5 (K/uL)    RBC 4.32  4.22 - 5.81 (MIL/uL)    Hemoglobin 13.6  13.0 - 17.0 (g/dL)    HCT 29.5  62.1 - 30.8 (%)    MCV 92.4  78.0 - 100.0 (fL)    MCH 31.5  26.0 - 34.0 (pg)    MCHC 34.1  30.0 - 36.0 (g/dL)    RDW 65.7  84.6 - 96.2 (%)    Platelets 173  150 - 400 (K/uL)     Ct Head Wo Contrast  09/20/2011  *RADIOLOGY REPORT*  Clinical Data: 61 year old male with altered level of consciousness, seizures.  CT HEAD WITHOUT CONTRAST  Technique:  Contiguous axial images were obtained from the base of the skull through the vertex without contrast.  Comparison: None.  Findings: Visualized paranasal sinuses and mastoids are clear. Visualized orbits and scalp soft tissues are within normal limits. Mild calcified atherosclerosis at the skull base.  Cerebral volume is within normal limits for age.  No midline shift, ventriculomegaly, mass effect, evidence of mass lesion, intracranial hemorrhage or evidence of cortically based acute infarction.  Gray-white matter differentiation is within normal limits throughout the brain.  No suspicious intracranial vascular hyperdensity.  Scattered dural calcifications.  IMPRESSION: Normal noncontrast CT appearance of the brain for age.  Original Report Authenticated By: Harley Hallmark, M.D.    Review of Systems  Unable to perform ROS: acuity of condition  Genitourinary: Positive for urgency. Negative for flank pain.   Blood pressure 135/95, pulse 76, temperature 97.7 F (36.5 C), temperature source Oral, resp. rate 20, height 5\' 8"  (1.727 m), weight 83.9 kg (184 lb 15.5 oz), SpO2 98.00%. Physical Exam  Assessment/Plan: Sitter after restraints removed; left room  Pt  is interviewed for refusal to take meds.  RN says today he took his Rx for HTN.  He admits to "I faked seizures two times - a year ago"  He says he lives in a home and faked the seizure so he could move to a quieter floor with better care.  He then says he wants to be moved to a  home in [place].  He worries about his bills;then says his daughter is his payee.  He is awake, alert and oriented to person, place and partial date, not to situation.  He makes no mention of seizures in ED.  He does not admit to huffing.  He denies hearing voices, visual hallucinations, suicidal/homicidal thoughts and denies any former attempts.  He says his mood is "medium' today.  His affect is blunted.  He says he has a 'gut' feeling that indicates whether he trusts someone or not.  He does not deny medications  He says he 'only wants Haldol and Cogentin'.  He does not name other medications.  His responses are interrupted by very loud, gutteral deep/choking coughing spells.  This patient is not fully oriented and apparently lacks the understanding of his medical problems and treatment. He does not offer information about huffing paint or glue. Considering the history of chronic huffing, there is usually a correlation of neuronal brain cell loss.  He does not admit to his agitation or refusal to remain in his room. His recent agitation after admission may be due to the fact that he has not had any medications for mood regulation and seizure control.  RECOMMENDATIONS: 1 based on this interview it is the opinion that patient does not have capacity to make appropriate decisions for his healthcare. 2 medications ordered appear appropriate for stated diagnoses. For any agitation, it is recommended to use Haldol 5 mg IV, Ativan 1 mg IV when necessary for agitation. 3 disposition will be based on medical clearance and acceptable lithium level. It is recommended patient returns to group home. 4 I have signed off the case unless further consultation is requested .  Rice Walsh 09/21/2011, 3:12 PM

## 2011-09-22 NOTE — Progress Notes (Signed)
Subjective:  No new episode of seizure overnight. Patient is alert and oriented x3. Denies any alcohol use for many years. Refuses taking any medication except Haldol and Cogentin. Says that lithium or any other medications does not help him.  Dr.Niu called the manager of his group home to figure out whether patient has been drinking alcohol. He was told that patient does sniff glue, try to drink aftershave and mouthwash for alcohol.   Objective: Vital signs in last 24 hours: Filed Vitals:   09/22/11 0511 09/22/11 0600 09/22/11 0800 09/22/11 0911  BP: 119/65 118/70 113/87 113/87  Pulse: 63 63  67  Temp: 99.3 F (37.4 C)  99 F (37.2 C) 99 F (37.2 C)  TempSrc: Oral  Oral Oral  Resp:   20 20  Height:      Weight:      SpO2: 9% 96% 96% 96%   Weight change: 10.6 oz (0.3 kg)  Intake/Output Summary (Last 24 hours) at 09/22/11 1013 Last data filed at 09/22/11 0900  Gross per 24 hour  Intake    600 ml  Output   1500 ml  Net   -900 ml   Physical Exam: General: resting in bed, not in acute distress. Des not respond appropriately to commands.   HEENT: PERRL, EOMI, no scleral icterus Cardiac: S1/S2, RRR, No murmurs, gallops or rubs Pulm: Good air movement bilaterally, Clear to auscultation bilaterally, No rales, wheezing, rhonchi or rubs. Abd: Soft,  nondistended, nontender, no rebound pain, no organomegaly, BS present Ext: No rashes or edema, 2+DP/PT pulse bilaterally Neuro: not oriented X 3, no facial droopy, 2+ brachial and knee reflex, moves all extremities. Babinski's sign negative.     Lab Results: Basic Metabolic Panel:  Lab 09/21/11 1610 09/20/11 1002  NA 142 145  K 4.1 3.1*  CL 110 106  CO2 25 19  GLUCOSE 103* 197*  BUN 11 10  CREATININE 0.94 0.84  CALCIUM 9.8 9.4  MG -- --  PHOS -- --   Liver Function Tests:  Lab 09/21/11 0440  AST 57*  ALT 42  ALKPHOS 61  BILITOT 0.8  PROT 7.4  ALBUMIN 3.9   CBC:  Lab 09/21/11 0440 09/20/11 0222  WBC 8.6 10.1    NEUTROABS -- --  HGB 13.6 15.2  HCT 39.9 44.1  MCV 92.4 93.2  PLT 173 233   Cardiac Enzymes:  Lab 09/20/11 1002  CKTOTAL 380*  CKMB --  CKMBINDEX --  TROPONINI --   Urine Drug Screen: Drugs of Abuse     Component Value Date/Time   LABOPIA NONE DETECTED 09/20/2011 1050   COCAINSCRNUR NONE DETECTED 09/20/2011 1050   LABBENZ NONE DETECTED 09/20/2011 1050   AMPHETMU NONE DETECTED 09/20/2011 1050   THCU NONE DETECTED 09/20/2011 1050   LABBARB NONE DETECTED 09/20/2011 1050    Alcohol Level:  Lab 09/20/11 1002  ETH <11    Micro Results: Recent Results (from the past 240 hour(s))  MRSA PCR SCREENING     Status: Normal   Collection Time   09/20/11  8:24 PM      Component Value Range Status Comment   MRSA by PCR NEGATIVE  NEGATIVE  Final    Studies/Results: Ct Head Wo Contrast  09/20/2011  *RADIOLOGY REPORT*  Clinical Data: 61 year old male with altered level of consciousness, seizures.  CT HEAD WITHOUT CONTRAST  Technique:  Contiguous axial images were obtained from the base of the skull through the vertex without contrast.  Comparison: None.  Findings: Visualized paranasal  sinuses and mastoids are clear. Visualized orbits and scalp soft tissues are within normal limits. Mild calcified atherosclerosis at the skull base.  Cerebral volume is within normal limits for age.  No midline shift, ventriculomegaly, mass effect, evidence of mass lesion, intracranial hemorrhage or evidence of cortically based acute infarction.  Gray-white matter differentiation is within normal limits throughout the brain.  No suspicious intracranial vascular hyperdensity.  Scattered dural calcifications.  IMPRESSION: Normal noncontrast CT appearance of the brain for age.  Original Report Authenticated By: Harley Hallmark, M.D.   Medications:  Scheduled Meds:    . aspirin EC  81 mg Oral Daily  . clonazePAM  1 mg Oral QHS  . enoxaparin  40 mg Subcutaneous Q24H  . folic acid  1 mg Oral Daily  . haloperidol  5  mg Oral QHS  . levETIRAcetam  500 mg Oral BID  . lithium carbonate  300 mg Oral Q12H  . mulitivitamin with minerals  1 tablet Oral Daily  . sertraline  100 mg Oral Daily  . thiamine  100 mg Oral Daily   Or  . thiamine  100 mg Intravenous Daily   Continuous Infusions:    . DISCONTD: sodium chloride 100 mL/hr at 09/20/11 2209   PRN Meds:.acetaminophen, LORazepam, LORazepam, metoprolol, polyethylene glycol Assessment/Plan:  # Seizure disorder: most likely due to noncompliance to medications. Patient refused to take his home medications in the past weeks and then developed seizure.  Another possibility is alcohol withdraw. Per his group home manager, patient has been constantly seeking for alcohol, indicating that patient may abuse alcohol in the past. No seizure episode overnight.   -will continue seizure precaution and bedside sitter -continue Keppra and clonopin -CIWA protolol for possible alcohol withdraw.   # Hypertension: bp 172/90 on admission. Blood pressure within normal range since admission.  -will continue metoprolol and monitor closely   # Schizophrenia: patient is still behavior abnormally overnight.  -Appreciate psych consult. -Patient giving different history to different healthcare providers. -will continue Haldol and Zofolt  # Bipolar: Patient is supposed to be on lithium. Lithium levels were low on admission. - He is still refusing to take lithium or any other medications except Haldol and Cogentin. - Will talk with him again once he stable  # DVT PPX; lovenox   LOS: 2 days   Gennell How 09/22/2011, 10:13 AM

## 2011-09-23 LAB — CBC
HCT: 43.2 % (ref 39.0–52.0)
Hemoglobin: 14.6 g/dL (ref 13.0–17.0)
RBC: 4.58 MIL/uL (ref 4.22–5.81)
WBC: 7.3 10*3/uL (ref 4.0–10.5)

## 2011-09-23 LAB — BASIC METABOLIC PANEL
BUN: 19 mg/dL (ref 6–23)
CO2: 25 mEq/L (ref 19–32)
Chloride: 107 mEq/L (ref 96–112)
GFR calc Af Amer: >90 mL/min (ref >90)
Glucose, Bld: 92 mg/dL (ref 70–99)
Potassium: 3.5 mEq/L (ref 3.5–5.1)

## 2011-09-23 NOTE — Progress Notes (Signed)
Pt refused to take Keppra and Lithium.  Dr. Allena Katz made aware.

## 2011-09-23 NOTE — Progress Notes (Signed)
Subjective:  No new episode of seizure overnight. Patient is alert and oriented x3. Denies any alcohol use for many years. Refuses taking any medication except Haldol and Cogentin. Says that lithium or any other medications does not help him. Took lithium yesterday morning but not at night. Refuses this morning again.  Dr.Niu called the manager of his group home to figure out whether patient has been drinking alcohol. He was told that patient does sniff glue, try to drink aftershave and mouthwash for alcohol.   Objective: Vital signs in last 24 hours: Filed Vitals:   09/23/11 0428 09/23/11 0500 09/23/11 0800 09/23/11 1238  BP: 114/68 124/76 140/82 116/68  Pulse: 67 65 65 78  Temp: 98.3 F (36.8 C)  97.9 F (36.6 C) 99.1 F (37.3 C)  TempSrc: Oral  Oral Oral  Resp: 16  18 16   Height:      Weight:  185 lb 3 oz (84 kg)    SpO2: 92% 94% 96% 93%   Weight change: -7.1 oz (-0.2 kg)  Intake/Output Summary (Last 24 hours) at 09/23/11 1319 Last data filed at 09/23/11 1000  Gross per 24 hour  Intake    480 ml  Output    850 ml  Net   -370 ml   Physical Exam: General: resting in bed, not in acute distress. Des not respond appropriately to commands.   HEENT: PERRL, EOMI, no scleral icterus Cardiac: S1/S2, RRR, No murmurs, gallops or rubs Pulm: Good air movement bilaterally, Clear to auscultation bilaterally, No rales, wheezing, rhonchi or rubs. Abd: Soft,  nondistended, nontender, no rebound pain, no organomegaly, BS present Ext: No rashes or edema, 2+DP/PT pulse bilaterally Neuro: not oriented X 3, no facial droopy, 2+ brachial and knee reflex, moves all extremities. Babinski's sign negative.     Lab Results: Basic Metabolic Panel:  Lab 09/23/11 1610 09/21/11 0440  NA 142 142  K 3.5 4.1  CL 107 110  CO2 25 25  GLUCOSE 92 103*  BUN 19 11  CREATININE 0.86 0.94  CALCIUM 9.6 9.8  MG -- --  PHOS -- --   Liver Function Tests:  Lab 09/21/11 0440  AST 57*  ALT 42  ALKPHOS 61   BILITOT 0.8  PROT 7.4  ALBUMIN 3.9   CBC:  Lab 09/23/11 0440 09/21/11 0440  WBC 7.3 8.6  NEUTROABS -- --  HGB 14.6 13.6  HCT 43.2 39.9  MCV 94.3 92.4  PLT 192 173   Cardiac Enzymes:  Lab 09/20/11 1002  CKTOTAL 380*  CKMB --  CKMBINDEX --  TROPONINI --   Urine Drug Screen: Drugs of Abuse     Component Value Date/Time   LABOPIA NONE DETECTED 09/20/2011 1050   COCAINSCRNUR NONE DETECTED 09/20/2011 1050   LABBENZ NONE DETECTED 09/20/2011 1050   AMPHETMU NONE DETECTED 09/20/2011 1050   THCU NONE DETECTED 09/20/2011 1050   LABBARB NONE DETECTED 09/20/2011 1050    Alcohol Level:  Lab 09/20/11 1002  ETH <11    Micro Results: Recent Results (from the past 240 hour(s))  MRSA PCR SCREENING     Status: Normal   Collection Time   09/20/11  8:24 PM      Component Value Range Status Comment   MRSA by PCR NEGATIVE  NEGATIVE  Final    Studies/Results: Ct Head Wo Contrast  09/20/2011  *RADIOLOGY REPORT*  Clinical Data: 61 year old male with altered level of consciousness, seizures.  CT HEAD WITHOUT CONTRAST  Technique:  Contiguous axial images were obtained from  the base of the skull through the vertex without contrast.  Comparison: None.  Findings: Visualized paranasal sinuses and mastoids are clear. Visualized orbits and scalp soft tissues are within normal limits. Mild calcified atherosclerosis at the skull base.  Cerebral volume is within normal limits for age.  No midline shift, ventriculomegaly, mass effect, evidence of mass lesion, intracranial hemorrhage or evidence of cortically based acute infarction.  Gray-white matter differentiation is within normal limits throughout the brain.  No suspicious intracranial vascular hyperdensity.  Scattered dural calcifications.  IMPRESSION: Normal noncontrast CT appearance of the brain for age.  Original Report Authenticated By: Harley Hallmark, M.D.   Medications:  Scheduled Meds:    . aspirin EC  81 mg Oral Daily  . clonazePAM  1 mg Oral  QHS  . enoxaparin  40 mg Subcutaneous Q24H  . folic acid  1 mg Oral Daily  . haloperidol  5 mg Oral QHS  . levETIRAcetam  500 mg Oral BID  . lithium carbonate  300 mg Oral Q12H  . mulitivitamin with minerals  1 tablet Oral Daily  . sertraline  100 mg Oral Daily  . thiamine  100 mg Oral Daily   Or  . thiamine  100 mg Intravenous Daily   Continuous Infusions:   PRN Meds:.acetaminophen, LORazepam, LORazepam, metoprolol, polyethylene glycol Assessment/Plan:  # Seizure disorder: most likely due to noncompliance to medications. Patient refused to take his home medications in the past weeks and then had  seizure.  Another possibility is alcohol withdrawal . Per his group home manager, patient has been constantly seeking for alcohol, indicating that patient may abuse alcohol in the past. No seizure episode overnight.   -will continue seizure precaution and bedside sitter -continue Keppra and clonopin -CIWA protolol for possible alcohol withdraw.   # Hypertension: bp 172/90 on admission. Blood pressure within normal range since admission.  -will continue metoprolol and monitor closely   # Schizophrenia: patient is still behavior abnormally overnight.  -Appreciate psych consult. -Patient giving different history to different healthcare providers. -will continue Haldol and Zofolt  # Bipolar: Patient is supposed to be on lithium. Lithium levels were low on admission. - He is still refusing to take lithium or any other medications except Haldol and Cogentin. - Will talk with him again once he stable  # DVT PPX; lovenox   LOS: 3 days   Remedios Mckone 09/23/2011, 1:19 PM

## 2011-09-24 MED ORDER — FOLIC ACID 1 MG PO TABS: 1.0000 mg | ORAL_TABLET | Freq: Every day | ORAL | Status: DC

## 2011-09-24 MED ORDER — THIAMINE HCL 100 MG PO TABS: 100.0000 mg | ORAL_TABLET | Freq: Every day | ORAL | Status: DC

## 2011-09-24 NOTE — Progress Notes (Signed)
Clinical Social Work-CSW completed FL2 with medications, [facility] to [facility] and has left 4 messages with [facility]-CSW will notify sister of d/c and is awaiting confirmation from [facility]-Jaeanna Mccomber-MSW, [phone]

## 2011-09-24 NOTE — Progress Notes (Signed)
Internal Medicine Teaching Service Attending Note Date: 09/24/2011  Patient name: Daryl Campbell  Medical record number: [id number]  Date of birth: Apr 10, 1951    This patient has been seen and discussed with the house staff. Please see their note for complete details. I concur with their findings with the following additions/corrections:[name] is feeling much better and now agrees to take his keppra. If he continues to improve will d/c soon.   Lina Sayre 09/24/2011, 4:20 PMTimothy Maurice March

## 2011-09-24 NOTE — Discharge Summary (Signed)
Patient Name:  Daryl Campbell  MRN: [id number]  PCP: No primary provider on file.  DOB:  08/06/1951   CSN: [id number]     Date of Admission:  09/20/2011  Date of Discharge:  09/24/2011      Attending Physician: Dr. Lina Sayre, MD         DISCHARGE DIAGNOSES: Principal Problem:     *Seizure disorder Active Problems:     Hypertension     Schizophrenia     Bipolar disorder    DISPOSITION AND FOLLOW-UP:  Patient sholude be followed up with his Psychiatric Doctor in 2 to 3 weeks. I will make appointment later and let his group home manager know.   Discharge Orders    Future Orders Please Complete By Expires   Diet general      Increase activity slowly      Call MD for:  persistant nausea and vomiting      Call MD for:  difficulty breathing, headache or visual disturbances      Call MD for:  persistant dizziness or light-headedness      Call MD for:  extreme fatigue          DISCHARGE MEDICATIONS: Current Discharge Medication List    START taking these medications   Details  folic acid (FOLVITE) 1 MG tablet Take 1 tablet (1 mg total) by mouth daily. Qty: 30 tablet, Refills: 6    thiamine 100 MG tablet Take 1 tablet (100 mg total) by mouth daily. Qty: 30 tablet, Refills: 6      CONTINUE these medications which have NOT CHANGED   Details  acetaminophen (TYLENOL) 325 MG tablet Take 650 mg by mouth 2 (two) times daily as needed.    aspirin EC 81 MG tablet Take 81 mg by mouth daily.    clonazePAM (KLONOPIN) 1 MG tablet Take 1 mg by mouth at bedtime.    haloperidol (HALDOL) 5 MG tablet Take 5 mg by mouth at bedtime.    levETIRAcetam (KEPPRA) 500 MG tablet Take 500 mg by mouth 2 (two) times daily.    LITHIUM CARBONATE PO Take 200 mg by mouth 3 (three) times daily. 1 tablet in the morning and 2 at bedtime    Multiple Vitamins-Minerals (MULTIVITAMINS THER. W/MINERALS) TABS Take 1 tablet by mouth daily.    polyethylene glycol powder (GLYCOLAX/MIRALAX) powder Take  17 g by mouth daily as needed.    sertraline (ZOLOFT) 100 MG tablet Take 100 mg by mouth daily.         CONSULTS: Psychiatry was consulted.     PROCEDURES PERFORMED:  Ct Head Wo Contrast  09/20/2011  *RADIOLOGY REPORT*  Clinical Data: 61 year old male with altered level of consciousness, seizures.  CT HEAD WITHOUT CONTRAST  Technique:  Contiguous axial images were obtained from the base of the skull through the vertex without contrast.  Comparison: None.  Findings: Visualized paranasal sinuses and mastoids are clear. Visualized orbits and scalp soft tissues are within normal limits. Mild calcified atherosclerosis at the skull base.  Cerebral volume is within normal limits for age.  No midline shift, ventriculomegaly, mass effect, evidence of mass lesion, intracranial hemorrhage or evidence of cortically based acute infarction.  Gray-white matter differentiation is within normal limits throughout the brain.  No suspicious intracranial vascular hyperdensity.  Scattered dural calcifications.  IMPRESSION: Normal noncontrast CT appearance of the brain for age.  Original Report Authenticated By: Harley Hallmark, M.D.       ADMISSION DATA: H&P: Patient  is 19 man with PMH of hypertension, schizophrenia, seizure disorder and HCV, who present with seizure. Patient is very confused and does not answer any questions appropriately, just mumbling when asked. So the history was obtained by calling the group home manager (Ms. Larkin Ina [phone]) and also from ED note.  Per Ms. White, patient is resident of group home ([facility]). Patient refused to take his home medications (including Lithium, Zoloft, Haldol and keppra) in the past week. He developed seizure at 12:00 last night which was witnessed by Ms. White, who described that patient had jerking movement all over his body for about 5 min. Patient did not fall, did not injure any parts of his body, did not bite his tongue. But he had urinary  incontinence. After the seizure episode, patient looked confused. EMS was called and took him to the [facility]. It was thought that patient's seizure is due to huffing paint and discharged from [facility]. After discharged, patient was very confused and developed seizures again at morning. He was taken to ED in the morning again.  No other history was provided.   Physical Exam: Vitals: T: 99 HR: 101 BP: 172/90 RR:18 O2 saturation: 95% at RA  General: resting in bed, not in acute distress. He appears to be post-ictal, very confused, does not respond appropriately to commands.   HEENT: PERRL, EOMI, no scleral icterus Cardiac: S1/S2, RRR, No murmurs, gallops or rubs Pulm: Good air movement bilaterally, Clear to auscultation bilaterally, No rales, wheezing, rhonchi or rubs. Abd: Soft,  nondistended, nontender, no rebound pain, no organomegaly, BS present Ext: No rashes or edema, 2+DP/PT pulse bilaterally Neuro: not oriented X 3, no facial droopy, 2+ brachial and knee reflex, moves all extremities. Babinski's sign negative.    Lab results: Basic Metabolic Panel:  Basename  09/20/11 1002  09/20/11 0222   NA  145  140   K  3.1*  3.1*   CL  106  101   CO2  19  9*   GLUCOSE  197*  159*   BUN  10  12   CREATININE  0.84  1.03   CALCIUM  9.4  9.7   MG  --  --   PHOS  --  --    CBC:  Basename  09/20/11 0222   WBC  10.1   NEUTROABS  --   HGB  15.2   HCT  44.1   MCV  93.2   PLT  233    Cardiac Enzymes:  Basename  09/20/11 1002   CKTOTAL  380*   CKMB  --   CKMBINDEX  --   TROPONINI  --     Drugs of Abuse       Component  Value  Date/Time     LABOPIA  NONE DETECTED  09/20/2011 1050     COCAINSCRNUR  NONE DETECTED  09/20/2011 1050     LABBENZ  NONE DETECTED  09/20/2011 1050     AMPHETMU  NONE DETECTED  09/20/2011 1050     THCU  NONE DETECTED  09/20/2011 1050     LABBARB  NONE DETECTED  09/20/2011 1050     Alcohol Level:  Basename  09/20/11 1002   ETH  <11    HOSPITAL COURSE:   # Seizure  disorder: most likely due to noncompliance to medications. Patient refused to take his home medications in the past weeks and then developed seizure. Other differential diagnosis include, but less likely, drug abuse (less likely, his UDS  is negative), stroke (unlikely, patient does not weakness or focal deficit), electrolyte abnormality (he has mild hyperkalemia, K 3.1,  which should not induce seizure) and head injury (CT-hear normal), arrythmia (EKG  Has no significant arrhythmia). Another possibility is alcohol withdraw, which is not clear now. Patient was re-started with keppra in the hospital. He did not have new episodes of seizure after admission. His mental status improved significantly. He is oriented X 3 at discharge. At beginning, he refused to take keppra. After I explained to him the importance of taking keppra for his seizure, he agreed to continue to take his keppra. He is discharged on home dose of keppra. He needs to be followed up with his psychiatric Doctor in 2 to 3 weeks.   # Hypertension: his bp was 172/90 on admission. Patient was treated with metoprolol. At discharge, his bp is normal. His blood pressure elevation may be related to his seizure. So I did no continue his HTN medication. Please re-evaluate his HTN at follow up visit.   # Schizophrenia: psych was consulted. Patient only agree to take his Haldol and Zofolt and cogentin. His mood is stable in the hospital. At discharge, we continued his Haldol and Zofolt. He needs to be followed up with his psychiatric Doctor in 2 to 3 weeks.  # Bipolar: Patient is supposed to be on lithium. Lithium levels were low on admission. But he refused to take lithium or any other medications except Haldol and Cogentin. He needs to be followed up with his psychiatric Doctor in 2 to 3 weeks.   DISCHARGE DATA:  Vital Signs: BP 101/59  Pulse 77  Temp(Src) 98.6 F (37 C) (Oral)  Resp 20  Ht 5\' 8"  (1.727 m)  Wt 187 lb 6.3 oz (85 kg)  BMI  28.49 kg/m2  SpO2 86%  Labs: No results found for this or any previous visit (from the past 24 hour(s)).    Signed: Lorretta Harp, MD PGY I, Internal Medicine Resident 09/24/2011, 1:11 PM

## 2011-09-24 NOTE — Progress Notes (Signed)
Pt stable, belongings at bedside. Discharge instructions/ medication instructions given. Pt. Verbalizes understanding. Pt. To be discharged to group home, ride on the way.

## 2011-09-24 NOTE — Progress Notes (Signed)
Pt. Complained of pain to his right AC. Redness was noted, questionable relation to IV access. Area was assessed and dressed with a guaze dressing.

## 2011-09-24 NOTE — Progress Notes (Signed)
Subjective:  No new episode of seizure overnight.  Patient is alert and oriented x3. Refuses taking any medication except Haldol and Cogentin. I explained to him that keppra is very important for preventing seizure. He agreed to take keppra.   Denies any alcohol use for many years. But per his group home manager, patient does sniff glue, try to drink aftershave and mouthwash for alcohol.   Objective: Vital signs in last 24 hours: Filed Vitals:   09/24/11 0400 09/24/11 0416 09/24/11 0500 09/24/11 0600  BP: 106/70  122/70 122/85  Pulse: 70 68 73 71  Temp: 99.1 F (37.3 C)     TempSrc: Oral     Resp:      Height:      Weight:   187 lb 6.3 oz (85 kg)   SpO2: 96% 96% 95% 96%   Weight change: 2 lb 3.3 oz (1 kg)  Intake/Output Summary (Last 24 hours) at 09/24/11 0814 Last data filed at 09/24/11 0500  Gross per 24 hour  Intake   1080 ml  Output   1175 ml  Net    -95 ml   Physical Exam: General: resting in bed, not in acute distress. Des not respond appropriately to commands.   HEENT: PERRL, EOMI, no scleral icterus Cardiac: S1/S2, RRR, No murmurs, gallops or rubs Pulm: Good air movement bilaterally, Clear to auscultation bilaterally, No rales, wheezing, rhonchi or rubs. Abd: Soft,  nondistended, nontender, no rebound pain, no organomegaly, BS present Ext: No rashes or edema, 2+DP/PT pulse bilaterally Neuro: alert and oriented X3, cranial nerves II-XII grossly intact, muscle strength 5/5 in all extremeties,  sensation to light touch intact.   Lab Results: Basic Metabolic Panel:  Lab 09/23/11 5784 09/21/11 0440  NA 142 142  K 3.5 4.1  CL 107 110  CO2 25 25  GLUCOSE 92 103*  BUN 19 11  CREATININE 0.86 0.94  CALCIUM 9.6 9.8  MG -- --  PHOS -- --   Liver Function Tests:  Lab 09/21/11 0440  AST 57*  ALT 42  ALKPHOS 61  BILITOT 0.8  PROT 7.4  ALBUMIN 3.9   CBC:  Lab 09/23/11 0440 09/21/11 0440  WBC 7.3 8.6  NEUTROABS -- --  HGB 14.6 13.6  HCT 43.2 39.9  MCV 94.3  92.4  PLT 192 173   Cardiac Enzymes:  Lab 09/20/11 1002  CKTOTAL 380*  CKMB --  CKMBINDEX --  TROPONINI --   Urine Drug Screen: Drugs of Abuse     Component Value Date/Time   LABOPIA NONE DETECTED 09/20/2011 1050   COCAINSCRNUR NONE DETECTED 09/20/2011 1050   LABBENZ NONE DETECTED 09/20/2011 1050   AMPHETMU NONE DETECTED 09/20/2011 1050   THCU NONE DETECTED 09/20/2011 1050   LABBARB NONE DETECTED 09/20/2011 1050    Alcohol Level:  Lab 09/20/11 1002  ETH <11    Micro Results: Recent Results (from the past 240 hour(s))  MRSA PCR SCREENING     Status: Normal   Collection Time   09/20/11  8:24 PM      Component Value Range Status Comment   MRSA by PCR NEGATIVE  NEGATIVE  Final    Studies/Results: Ct Head Wo Contrast  09/20/2011  *RADIOLOGY REPORT*  Clinical Data: 61 year old male with altered level of consciousness, seizures.  CT HEAD WITHOUT CONTRAST  Technique:  Contiguous axial images were obtained from the base of the skull through the vertex without contrast.  Comparison: None.  Findings: Visualized paranasal sinuses and mastoids are clear. Visualized  orbits and scalp soft tissues are within normal limits. Mild calcified atherosclerosis at the skull base.  Cerebral volume is within normal limits for age.  No midline shift, ventriculomegaly, mass effect, evidence of mass lesion, intracranial hemorrhage or evidence of cortically based acute infarction.  Gray-white matter differentiation is within normal limits throughout the brain.  No suspicious intracranial vascular hyperdensity.  Scattered dural calcifications.  IMPRESSION: Normal noncontrast CT appearance of the brain for age.  Original Report Authenticated By: Harley Hallmark, M.D.   Medications:  Scheduled Meds:    . aspirin EC  81 mg Oral Daily  . clonazePAM  1 mg Oral QHS  . enoxaparin  40 mg Subcutaneous Q24H  . folic acid  1 mg Oral Daily  . haloperidol  5 mg Oral QHS  . levETIRAcetam  500 mg Oral BID  . lithium  carbonate  300 mg Oral Q12H  . mulitivitamin with minerals  1 tablet Oral Daily  . sertraline  100 mg Oral Daily  . thiamine  100 mg Oral Daily   Or  . thiamine  100 mg Intravenous Daily   Continuous Infusions:   PRN Meds:.acetaminophen, LORazepam, LORazepam, metoprolol, polyethylene glycol Assessment/Plan:  # Seizure disorder: most likely due to noncompliance to medications. Patient refused to take his home medications in the past weeks and then had  seizure.  Another possibility is alcohol withdrawal . Per his group home manager, patient has been constantly seeking for alcohol, indicating that patient may abuse alcohol in the past. No seizure episode overnight.   -will continue seizure precaution and bedside sitter -Patient agreed to continue to take Keppra -CIWA protolol for possible alcohol withdraw.   # Hypertension: bp 172/90 on admission. Blood pressure within normal range since admission.  -will continue metoprolol and monitor closely   # Schizophrenia: patient's mood is better today. He talks appropriately.  -Appreciate psych help in managing our patient. Patient does not have capacity to make appropriate decisions for his healthcare. It is recommended patient returns to group home. -will continue Haldol and Zofolt and cogentin.  # Bipolar: Patient is supposed to be on lithium. Lithium levels were low on admission. - He is still refusing to take lithium or any other medications except Haldol and Cogentin. - Will talk with him again once he stable  # DVT PPX; lovenox   LOS: 4 days   Lorretta Harp 09/24/2011, 8:14 AM

## 2011-09-24 NOTE — Progress Notes (Signed)
Clinical Social Work-Full assessment in shadow chart-pt a ward of the state and a resident of [facility] left message with DSS Guardian-Cheryl [phone] and has left message with [facility]. CSW will facilitate d/c as directed-Tarahji Ramthun-MSW, [phone]

## 2011-09-25 ENCOUNTER — Encounter (HOSPITAL_COMMUNITY): Payer: Self-pay | Admitting: *Deleted

## 2011-09-25 ENCOUNTER — Observation Stay (HOSPITAL_COMMUNITY)
Admission: EM | Admit: 2011-09-25 | Discharge: 2011-09-26 | Disposition: A | Discharge status: Home / Self Care | Payer: Medicare Other | Attending: Internal Medicine | Admitting: Internal Medicine

## 2011-09-25 DIAGNOSIS — Y849 Medical procedure, unspecified as the cause of abnormal reaction of the patient, or of later complication, without mention of misadventure at the time of the procedure: Secondary | ICD-10-CM | POA: Insufficient documentation

## 2011-09-25 DIAGNOSIS — IMO0002 Reserved for concepts with insufficient information to code with codable children: Principal | ICD-10-CM | POA: Insufficient documentation

## 2011-09-25 DIAGNOSIS — I10 Essential (primary) hypertension: Secondary | ICD-10-CM | POA: Insufficient documentation

## 2011-09-25 DIAGNOSIS — B1920 Unspecified viral hepatitis C without hepatic coma: Secondary | ICD-10-CM | POA: Insufficient documentation

## 2011-09-25 DIAGNOSIS — M7989 Other specified soft tissue disorders: Secondary | ICD-10-CM | POA: Insufficient documentation

## 2011-09-25 DIAGNOSIS — Y9210 Unspecified residential institution as the place of occurrence of the external cause: Secondary | ICD-10-CM | POA: Insufficient documentation

## 2011-09-25 DIAGNOSIS — L0390 Cellulitis, unspecified: Secondary | ICD-10-CM | POA: Diagnosis present

## 2011-09-25 DIAGNOSIS — G40909 Epilepsy, unspecified, not intractable, without status epilepticus: Secondary | ICD-10-CM | POA: Diagnosis present

## 2011-09-25 DIAGNOSIS — I808 Phlebitis and thrombophlebitis of other sites: Secondary | ICD-10-CM | POA: Insufficient documentation

## 2011-09-25 DIAGNOSIS — I809 Phlebitis and thrombophlebitis of unspecified site: Secondary | ICD-10-CM

## 2011-09-25 DIAGNOSIS — I998 Other disorder of circulatory system: Secondary | ICD-10-CM | POA: Insufficient documentation

## 2011-09-25 DIAGNOSIS — F17200 Nicotine dependence, unspecified, uncomplicated: Secondary | ICD-10-CM | POA: Insufficient documentation

## 2011-09-25 LAB — POCT I-STAT, CHEM 8
BUN: 12 mg/dL (ref 6–23)
Calcium, Ion: 1.2 mmol/L (ref 1.12–1.32)
Chloride: 106 mEq/L (ref 96–112)
Creatinine, Ser: 1 mg/dL (ref 0.50–1.35)
Glucose, Bld: 96 mg/dL (ref 70–99)
TCO2: 27 mmol/L (ref 0–100)

## 2011-09-25 MED ORDER — VANCOMYCIN HCL IN DEXTROSE 1-5 GM/200ML-% IV SOLN
1000.0000 mg | Freq: Once | INTRAVENOUS | Status: AC
  Administered 2011-09-25: 1000 mg via INTRAVENOUS
  Filled 2011-09-25: qty 200

## 2011-09-25 NOTE — ED Provider Notes (Signed)
History     CSN: [id number]  Arrival date & time 09/25/11  2024   First MD Initiated Contact with Patient 09/25/11 2058      Chief Complaint  Patient presents with  . Arm Pain    seen 4 days ago for seizure disorder and admitted, discharged today, now c/o IV site pain R ac     HPI  History provided by the patient and previous medical records. Patient is a 61 year old male with history of seizure disorder, hypertension who presents with complaints of your edema and pain to right upper arm. Patient was seen recently in the emergency room on January 17 for seizures. Patient was admitted to the hospital for 3 days. At that time he had IV placed in his right antecubital fossa. Patient had IV removed on the 20th after having complaints of pain. He was discharged home and reports having increasing redness, swelling and pain to the area. Patient denies having any fever, chills, or sweats at home.    Past Medical History  Diagnosis Date  . Hypertension   . Psychiatric disorder   . Hepatitis C   . Seizures     History reviewed. No pertinent past surgical history.  No family history on file.  History  Substance Use Topics  . Smoking status: Never Smoker   . Smokeless tobacco: Not on file  . Alcohol Use: No      Review of Systems  Constitutional: Negative for fever and chills.  Respiratory: Negative for shortness of breath.   Cardiovascular: Negative for chest pain.  All other systems reviewed and are negative.    Allergies  Review of patient's allergies indicates no known allergies.  Home Medications   Current Outpatient Rx  Name Route Sig Dispense Refill  . ACETAMINOPHEN 325 MG PO TABS Oral Take 650 mg by mouth 2 (two) times daily as needed. For pain.    . ASPIRIN EC 81 MG PO TBEC Oral Take 81 mg by mouth daily.    Marland Kitchen VITAMIN D 1000 UNITS PO TABS Oral Take 1,000 Units by mouth daily.    Marland Kitchen CLONAZEPAM 1 MG PO TABS Oral Take 1 mg by mouth at bedtime.    Marland Kitchen HALOPERIDOL 5  MG PO TABS Oral Take 5 mg by mouth at bedtime.    Marland Kitchen LEVETIRACETAM 500 MG PO TABS Oral Take 500 mg by mouth 2 (two) times daily.    Marland Kitchen LITHIUM CARBONATE PO Oral Take 200 mg by mouth 3 (three) times daily. 1 tablet in the morning and 2 at bedtime    . THERA M PLUS PO TABS Oral Take 1 tablet by mouth daily.    . SERTRALINE HCL 100 MG PO TABS Oral Take 100 mg by mouth daily.      BP 120/75  Pulse 62  Temp(Src) 98.5 F (36.9 C) (Oral)  Resp 18  SpO2 96%  Physical Exam  Nursing note and vitals reviewed. Constitutional: He is oriented to person, place, and time. He appears well-developed and well-nourished. No distress.  HENT:  Head: Normocephalic and atraumatic.  Cardiovascular: Normal rate and regular rhythm.   Pulmonary/Chest: Effort normal and breath sounds normal.  Abdominal: Soft.  Musculoskeletal: Normal range of motion. He exhibits edema and tenderness.       See skin exam  Neurological: He is alert and oriented to person, place, and time.  Skin: Skin is warm.       Erythema of anterior right upper arm extending from a.c. fossa to the  mid biceps area. 2-3 cm area of induration. Skin is warm with tenderness to palpation.  Psychiatric: He has a normal mood and affect. His behavior is normal.    ED Course  Procedures   Labs Reviewed  DIFFERENTIAL - Abnormal; Notable for the following:    Neutrophils Relative 40 (*)    All other components within normal limits  CBC  POCT I-STAT, CHEM 8  I-STAT, CHEM 8   Results for orders placed during the hospital encounter of 09/25/11  CBC      Component Value Range   WBC 6.2  4.0 - 10.5 (K/uL)   RBC 4.56  4.22 - 5.81 (MIL/uL)   Hemoglobin 14.5  13.0 - 17.0 (g/dL)   HCT 16.1  09.6 - 04.5 (%)   MCV 91.2  78.0 - 100.0 (fL)   MCH 31.8  26.0 - 34.0 (pg)   MCHC 34.9  30.0 - 36.0 (g/dL)   RDW 40.9  81.1 - 91.4 (%)   Platelets 184  150 - 400 (K/uL)  DIFFERENTIAL      Component Value Range   Neutrophils Relative 40 (*) 43 - 77 (%)    Neutro Abs 2.5  1.7 - 7.7 (K/uL)   Lymphocytes Relative 44  12 - 46 (%)   Lymphs Abs 2.7  0.7 - 4.0 (K/uL)   Monocytes Relative 11  3 - 12 (%)   Monocytes Absolute 0.7  0.1 - 1.0 (K/uL)   Eosinophils Relative 4  0 - 5 (%)   Eosinophils Absolute 0.3  0.0 - 0.7 (K/uL)   Basophils Relative 0  0 - 1 (%)   Basophils Absolute 0.0  0.0 - 0.1 (K/uL)  POCT I-STAT, CHEM 8      Component Value Range   Sodium 141  135 - 145 (mEq/L)   Potassium 3.8  3.5 - 5.1 (mEq/L)   Chloride 106  96 - 112 (mEq/L)   BUN 12  6 - 23 (mg/dL)   Creatinine, Ser 7.82  0.50 - 1.35 (mg/dL)   Glucose, Bld 96  70 - 99 (mg/dL)   Calcium, Ion 9.56  1.12 - 1.32 (mmol/L)   TCO2 27  0 - 100 (mmol/L)   Hemoglobin 14.6  13.0 - 17.0 (g/dL)   HCT 21.3  08.6 - 57.8 (%)      1. Septic phlebitis   2. Cellulitis       MDM  9:00 PM patient seen and evaluated. Patient in no acute distress.  Patient seen and discussed with attending physician. Ultrasound performed over the arm with images saved. Findings are concerning for septic phlebitis with diffuse fluid collection around the vein as well as debris or material within the vein. Will obtain basic labs and plan for admission. Will also start vancomycin IV.  Spoke with outpatient clinics. They will except patient for admission and transfer to [facility] Patient to be admitted under Dr. Coralee Pesa to a general med surge bed.    Angus Seller, [place] 09/26/11 [phone]

## 2011-09-25 NOTE — ED Notes (Signed)
WUJ:[id number]<JY> Expected date:09/25/11<BR> Expected time: 8:22 PM<BR> Means of arrival:Ambulance<BR> Comments:<BR> EMS 22 [facility] - ambulatory, arm pain

## 2011-09-25 NOTE — ED Provider Notes (Signed)
Patient states he was in the ER 3 days ago after having had a seizure. He relates he thought that the IV was stopped and his muscle. He relates progressively worsening swelling, pain and redness of his right upper arm and has pus draining out of the room but catheter was inserted.  Patient has extremely red and distal upper right arm with firmness felt just above the antecubital space. I did bedside ultrasound which showed fluid collection circumferentially around the vein with some debris in the vein indicating possibly clot. The vein was noncompressible. These views were archived.  Medical screening examination/treatment/procedure(s) were conducted as a shared visit with non-physician practitioner(s) and myself.  I personally evaluated the patient during the encounter Devoria Albe, MD, Franz Dell, MD 09/25/11 [id number]

## 2011-09-25 NOTE — ED Notes (Signed)
Call into bathroom by patient. Patient found to be standing in bathroom with no clothes or underwear on. States "I want a physical." Informed patient to put clothes back. Patient escorted back to room by ED tech.

## 2011-09-26 ENCOUNTER — Encounter (HOSPITAL_COMMUNITY): Payer: Self-pay | Admitting: Internal Medicine

## 2011-09-26 DIAGNOSIS — L0390 Cellulitis, unspecified: Secondary | ICD-10-CM | POA: Diagnosis present

## 2011-09-26 DIAGNOSIS — IMO0002 Reserved for concepts with insufficient information to code with codable children: Secondary | ICD-10-CM

## 2011-09-26 LAB — CBC
HCT: 41.6 % (ref 39.0–52.0)
Hemoglobin: 14.5 g/dL (ref 13.0–17.0)
MCH: 31.6 pg (ref 26.0–34.0)
MCHC: 34.9 g/dL (ref 30.0–36.0)
MCV: 91.2 fL (ref 78.0–100.0)
MCV: 92.3 fL (ref 78.0–100.0)
Platelets: 183 10*3/uL (ref 150–400)
RDW: 11.6 % (ref 11.5–15.5)
RDW: 11.8 % (ref 11.5–15.5)
WBC: 6 10*3/uL (ref 4.0–10.5)
WBC: 6.2 10*3/uL (ref 4.0–10.5)

## 2011-09-26 LAB — DIFFERENTIAL
Basophils Absolute: 0 10*3/uL (ref 0.0–0.1)
Basophils Absolute: 0 10*3/uL (ref 0.0–0.1)
Basophils Relative: 0 % (ref 0–1)
Eosinophils Absolute: 0.1 10*3/uL (ref 0.0–0.7)
Eosinophils Relative: 2 % (ref 0–5)
Eosinophils Relative: 4 % (ref 0–5)
Lymphocytes Relative: 43 % (ref 12–46)
Lymphocytes Relative: 44 % (ref 12–46)
Monocytes Absolute: 0.7 10*3/uL (ref 0.1–1.0)
Monocytes Relative: 11 % (ref 3–12)
Neutro Abs: 2.5 10*3/uL (ref 1.7–7.7)

## 2011-09-26 MED ORDER — ACETAMINOPHEN 650 MG RE SUPP: 650.0000 mg | Freq: Four times a day (QID) | RECTAL | Status: DC | PRN

## 2011-09-26 MED ORDER — CLONAZEPAM 1 MG PO TABS: 1.0000 mg | ORAL_TABLET | Freq: Every day | ORAL | Status: DC

## 2011-09-26 MED ORDER — SERTRALINE HCL 100 MG PO TABS
100.0000 mg | ORAL_TABLET | Freq: Every day | ORAL | Status: DC
  Filled 2011-09-26: qty 1

## 2011-09-26 MED ORDER — LITHIUM CARBONATE 300 MG PO CAPS
300.0000 mg | ORAL_CAPSULE | Freq: Two times a day (BID) | ORAL | Status: DC
  Administered 2011-09-26: 300 mg via ORAL
  Filled 2011-09-26 (×3): qty 1

## 2011-09-26 MED ORDER — SODIUM CHLORIDE 0.9 % IJ SOLN: 3.0000 mL | INTRAMUSCULAR | Status: DC | PRN

## 2011-09-26 MED ORDER — ENOXAPARIN SODIUM 40 MG/0.4ML ~~LOC~~ SOLN
40.0000 mg | SUBCUTANEOUS | Status: DC
  Administered 2011-09-26: 40 mg via SUBCUTANEOUS
  Filled 2011-09-26 (×3): qty 0.4

## 2011-09-26 MED ORDER — LEVETIRACETAM 500 MG PO TABS
500.0000 mg | ORAL_TABLET | Freq: Two times a day (BID) | ORAL | Status: DC
Start: 2011-09-26 — End: 2011-09-26
  Filled 2011-09-26 (×2): qty 1

## 2011-09-26 MED ORDER — SODIUM CHLORIDE 0.9 % IV SOLN
INTRAVENOUS | Status: DC
  Administered 2011-09-26: 1000 mL via INTRAVENOUS

## 2011-09-26 MED ORDER — THERA M PLUS PO TABS
1.0000 | ORAL_TABLET | Freq: Every day | ORAL | Status: DC
  Filled 2011-09-26: qty 1

## 2011-09-26 MED ORDER — MELOXICAM 15 MG PO TABS: 15.0000 mg | ORAL_TABLET | Freq: Every day | ORAL | Status: DC

## 2011-09-26 MED ORDER — VANCOMYCIN HCL IN DEXTROSE 1-5 GM/200ML-% IV SOLN
1000.0000 mg | Freq: Two times a day (BID) | INTRAVENOUS | Status: DC
Start: 2011-09-26 — End: 2011-09-26
  Filled 2011-09-26 (×2): qty 200

## 2011-09-26 MED ORDER — ACETAMINOPHEN 325 MG PO TABS
650.0000 mg | ORAL_TABLET | Freq: Four times a day (QID) | ORAL | Status: DC | PRN
  Administered 2011-09-26: 650 mg via ORAL
  Filled 2011-09-26: qty 2

## 2011-09-26 MED ORDER — ASPIRIN EC 81 MG PO TBEC
81.0000 mg | DELAYED_RELEASE_TABLET | Freq: Every day | ORAL | Status: DC
  Filled 2011-09-26: qty 1

## 2011-09-26 MED ORDER — SODIUM CHLORIDE 0.9 % IJ SOLN: 3.0000 mL | Freq: Two times a day (BID) | INTRAMUSCULAR | Status: DC

## 2011-09-26 MED ORDER — SODIUM CHLORIDE 0.9 % IV SOLN: 250.0000 mL | INTRAVENOUS | Status: DC | PRN

## 2011-09-26 MED ORDER — VITAMIN D3 25 MCG (1000 UNIT) PO TABS
1000.0000 [IU] | ORAL_TABLET | Freq: Every day | ORAL | Status: DC
  Filled 2011-09-26: qty 1

## 2011-09-26 MED ORDER — INFLUENZA VIRUS VACC SPLIT PF IM SUSP
0.5000 mL | INTRAMUSCULAR | Status: DC
Start: 2011-09-26 — End: 2011-09-26
  Filled 2011-09-26: qty 0.5

## 2011-09-26 MED ORDER — DOXYCYCLINE HYCLATE 100 MG PO TABS: 100.0000 mg | ORAL_TABLET | Freq: Two times a day (BID) | ORAL | Status: AC

## 2011-09-26 MED ORDER — HALOPERIDOL 5 MG PO TABS
5.0000 mg | ORAL_TABLET | Freq: Every day | ORAL | Status: DC
  Filled 2011-09-26: qty 1

## 2011-09-26 NOTE — Progress Notes (Signed)
Patient transfered from [facility] to [id number] transported by [facility]. Patient Alert and oriented, denies any distress, Right A/C red, tender to touch and firm, no drainage noted, left thigh bruises and right leg scab open to air, no drainage noted. Oriented patient to room and unit. Reviewed plan of care with patient and MD, Dr. Janey Genta  notified of patient arrival. Will continue to assess patient.

## 2011-09-26 NOTE — ED Provider Notes (Signed)
00 40 Dr. Eben Burow accepts in transfer to [facility] under the service of Dr. Coralee Pesa. Devoria Albe, MD, FACEP   Ward Givens, MD 09/26/11 0040

## 2011-09-26 NOTE — Discharge Summary (Signed)
Internal Medicine Teaching [facility] Discharge Note  Name: Daryl Campbell MRN: [id number] DOB: [birth year] 61 y.o.  Date of Admission: 09/25/2011  8:24 PM Date of Discharge: 09/26/2011 Attending Physician: Zoila Shutter, MD  Discharge Diagnosis: Principal Problem:  *Cellulitis Active Problems:  Seizure disorder   Discharge Medications: Medication List  As of 09/26/2011  9:11 AM   STOP taking these medications         acetaminophen 325 MG tablet         TAKE these medications         aspirin EC 81 MG tablet   Take 81 mg by mouth daily.      cholecalciferol 1000 UNITS tablet   Commonly known as: VITAMIN D   Take 1,000 Units by mouth daily.      clonazePAM 1 MG tablet   Commonly known as: KLONOPIN   Take 1 mg by mouth at bedtime.      doxycycline 100 MG tablet   Commonly known as: VIBRA-TABS   Take 1 tablet (100 mg total) by mouth 2 (two) times daily.      haloperidol 5 MG tablet   Commonly known as: HALDOL   Take 5 mg by mouth at bedtime.      levETIRAcetam 500 MG tablet   Commonly known as: KEPPRA   Take 500 mg by mouth 2 (two) times daily.      LITHIUM CARBONATE PO   Take 200 mg by mouth 3 (three) times daily. 1 tablet in the morning and 2 at bedtime      meloxicam 15 MG tablet   Commonly known as: MOBIC   Take 1 tablet (15 mg total) by mouth daily.      multivitamins ther. w/minerals Tabs   Take 1 tablet by mouth daily.      sertraline 100 MG tablet   Commonly known as: ZOLOFT   Take 100 mg by mouth daily.            Disposition and follow-up:   Mr.Daryl Campbell was discharged from [facility] in stable condition.    Follow-up Appointments:  Discharge Orders    Future Orders Please Complete By Expires   Diet - low sodium heart healthy      Increase activity slowly      Discharge instructions      Comments:   Please apply warm soak to affected area for at least 20 minutes 3 times daily Take Mobic 15mg  once daily  for inflammation and pain      Consultations:  None  Procedures Performed:  Ct Head Wo Contrast  09/20/2011  *RADIOLOGY REPORT*  Clinical Data: 61 year old male with altered level of consciousness, seizures.  CT HEAD WITHOUT CONTRAST  Technique:  Contiguous axial images were obtained from the base of the skull through the vertex without contrast.  Comparison: None.  Findings: Visualized paranasal sinuses and mastoids are clear. Visualized orbits and scalp soft tissues are within normal limits. Mild calcified atherosclerosis at the skull base.  Cerebral volume is within normal limits for age.  No midline shift, ventriculomegaly, mass effect, evidence of mass lesion, intracranial hemorrhage or evidence of cortically based acute infarction.  Gray-white matter differentiation is within normal limits throughout the brain.  No suspicious intracranial vascular hyperdensity.  Scattered dural calcifications.  IMPRESSION: Normal noncontrast CT appearance of the brain for age.  Original Report Authenticated By: Harley Hallmark, M.D.    Admission HPI: The pt is a 60 YO  man who comes into the ED with an area of redness and swelling on his right upper extremity. He was admitted to the hospital for a seizure and was discharged with keppra for seizures. He then noticed some redness and swelling over the site where he had an IV. This was worsening so he came to the hospital. He has had no fevers, chills, nausea, vomiting, diarrhea, abdominal pain, chest pain. He does admit to have some psychiatric problems for which he is medicated and taking some drugs including sniffing glue, PCP, ectasy but states most of this use stopped a year or so ago although admits to sniffing glue more recently. He also smokes 1 PPD of cigarettes and has for several years.     Hospital Course by problem list:  1. Cellulitis/phlebitis - Right upper extremity, received one dose of vancomycin at [facility] long ED and continued to improve  throughout hospital course. Patient did not have fever or leukocytosis.  There is induration and mild erythema at the right elbow joint, but no drainage or abscess noted.  Patient will need to complete a 2 weeks course of Doxycycline 100mg  bid, warm soak/compress on affected area for 20 minutes 3 times daily, and Mobic 15mg  one tablet daily x 2 weeks for pain and inflammation.  2. Seizure disorder -stable. We continued Keppra as per home dosing.  3. Psychiatric disorder - unspecified but will continue his home lithium, haldol, zoloft.    Discharge Vitals:  BP 138/63  Pulse 60  Temp(Src) 98.2 F (36.8 C) (Oral)  Resp 17  Ht 5\' 10"  (1.778 m)  Wt 84.641 kg (186 lb 9.6 oz)  BMI 26.77 kg/m2  SpO2 98%  Discharge Physical Exam:  GEN- NAD Heart: S1S2, RRR, no m/g/r Lungs: CTAB, no wheezes Abd: soft, NT, NT, +BS Skin: right upper extremity: induration at elbow joint anteriorly with mild erythema, 3x4cm in size, no drainage or abscess, +mild tenderness to palpation.   Neuro: nonfocal  Discharge Labs:  Results for orders placed during the hospital encounter of 09/25/11 (from the past 24 hour(s))  CBC     Status: Normal   Collection Time   09/25/11 11:45 PM      Component Value Range   WBC 6.2  4.0 - 10.5 (K/uL)   RBC 4.56  4.22 - 5.81 (MIL/uL)   Hemoglobin 14.5  13.0 - 17.0 (g/dL)   HCT 81.1  91.4 - 78.2 (%)   MCV 91.2  78.0 - 100.0 (fL)   MCH 31.8  26.0 - 34.0 (pg)   MCHC 34.9  30.0 - 36.0 (g/dL)   RDW 95.6  21.3 - 08.6 (%)   Platelets 184  150 - 400 (K/uL)  DIFFERENTIAL     Status: Abnormal   Collection Time   09/25/11 11:45 PM      Component Value Range   Neutrophils Relative 40 (*) 43 - 77 (%)   Neutro Abs 2.5  1.7 - 7.7 (K/uL)   Lymphocytes Relative 44  12 - 46 (%)   Lymphs Abs 2.7  0.7 - 4.0 (K/uL)   Monocytes Relative 11  3 - 12 (%)   Monocytes Absolute 0.7  0.1 - 1.0 (K/uL)   Eosinophils Relative 4  0 - 5 (%)   Eosinophils Absolute 0.3  0.0 - 0.7 (K/uL)   Basophils  Relative 0  0 - 1 (%)   Basophils Absolute 0.0  0.0 - 0.1 (K/uL)  POCT I-STAT, CHEM 8     Status: Normal  Collection Time   09/25/11 11:51 PM      Component Value Range   Sodium 141  135 - 145 (mEq/L)   Potassium 3.8  3.5 - 5.1 (mEq/L)   Chloride 106  96 - 112 (mEq/L)   BUN 12  6 - 23 (mg/dL)   Creatinine, Ser 4.09  0.50 - 1.35 (mg/dL)   Glucose, Bld 96  70 - 99 (mg/dL)   Calcium, Ion 8.11  1.12 - 1.32 (mmol/L)   TCO2 27  0 - 100 (mmol/L)   Hemoglobin 14.6  13.0 - 17.0 (g/dL)   HCT 91.4  78.2 - 95.6 (%)  CBC     Status: Normal   Collection Time   09/26/11  4:55 AM      Component Value Range   WBC 6.0  4.0 - 10.5 (K/uL)   RBC 4.65  4.22 - 5.81 (MIL/uL)   Hemoglobin 14.7  13.0 - 17.0 (g/dL)   HCT 21.3  08.6 - 57.8 (%)   MCV 92.3  78.0 - 100.0 (fL)   MCH 31.6  26.0 - 34.0 (pg)   MCHC 34.3  30.0 - 36.0 (g/dL)   RDW 46.9  62.9 - 52.8 (%)   Platelets 183  150 - 400 (K/uL)  DIFFERENTIAL     Status: Normal   Collection Time   09/26/11  4:55 AM      Component Value Range   Neutrophils Relative 43  43 - 77 (%)   Neutro Abs 2.6  1.7 - 7.7 (K/uL)   Lymphocytes Relative 43  12 - 46 (%)   Lymphs Abs 2.6  0.7 - 4.0 (K/uL)   Monocytes Relative 11  3 - 12 (%)   Monocytes Absolute 0.6  0.1 - 1.0 (K/uL)   Eosinophils Relative 2  0 - 5 (%)   Eosinophils Absolute 0.1  0.0 - 0.7 (K/uL)   Basophils Relative 0  0 - 1 (%)   Basophils Absolute 0.0  0.0 - 0.1 (K/uL)    Signed: Richmond Coldren 09/26/2011, 9:11 AM

## 2011-09-26 NOTE — ED Notes (Signed)
[facility] contacted for transportation to [place]. 

## 2011-09-26 NOTE — Progress Notes (Signed)
Clinical Social Work-CSW d/c pt on prior admission-pt from [facility]-CSW updated meds on FL2 and faxed to group home-Group Home will be providing transportation-No further needs. Jodean Lima, [phone]

## 2011-09-26 NOTE — Progress Notes (Signed)
PHARMACY - ANTIBIOTIC CONSULT  INITIAL NOTE  Pharmacy Consult for: Vancomycin  Indication: Cellulitis   Patient Data:   Allergies: No Known Allergies  Patient Measurements: Height: 5\' 10"  (177.8 cm) Weight: 186 lb 9.6 oz (84.641 kg) IBW/kg (Calculated) : 73   Vital Signs: Temp:  [98 F (36.7 C)-98.8 F (37.1 C)] 98 F (36.7 C) (01/23 0243) Pulse Rate:  [62-67] 64  (01/23 0243) Resp:  [18-20] 18  (01/23 0243) BP: (118-138)/(74-85) 138/81 mmHg (01/23 0243) SpO2:  [96 %-98 %] 96 % (01/23 0243) Weight:  [186 lb 9.6 oz (84.641 kg)] 186 lb 9.6 oz (84.641 kg) (01/23 0243)  Intake/Output from previous day: No intake or output data in the 24 hours ending 09/26/11 0358  Labs:  Basename 09/25/11 2351 09/25/11 2345 09/23/11 0440  WBC -- 6.2 7.3  HGB 14.6 14.5 14.6  PLT -- 184 192  LABCREA -- -- --  CREATININE 1.00 -- 0.86   Estimated Creatinine Clearance: 81.1 ml/min (by C-G formula based on Cr of 1). No results found for this basename: VANCOTROUGH:2,VANCOPEAK:2,VANCORANDOM:2,GENTTROUGH:2,GENTPEAK:2,GENTRANDOM:2,TOBRATROUGH:2,TOBRAPEAK:2,TOBRARND:2,AMIKACINPEAK:2,AMIKACINTROU:2,AMIKACIN:2, in the last 72 hours   Microbiology: Recent Results (from the past 720 hour(s))  MRSA PCR SCREENING     Status: Normal   Collection Time   09/20/11  8:24 PM      Component Value Range Status Comment   MRSA by PCR NEGATIVE  NEGATIVE  Final     Medical History: Past Medical History  Diagnosis Date  . Hypertension   . Psychiatric disorder   . Hepatitis C   . Seizures     Scheduled Medications:     . aspirin EC  81 mg Oral Daily  . cholecalciferol  1,000 Units Oral Daily  . clonazePAM  1 mg Oral QHS  . enoxaparin  40 mg Subcutaneous Q24H  . haloperidol  5 mg Oral QHS  . levETIRAcetam  500 mg Oral BID  . lithium carbonate  300 mg Oral BID WC  . multivitamins ther. w/minerals  1 tablet Oral Daily  . sertraline  100 mg Oral Daily  . sodium chloride  3 mL Intravenous Q12H  .  vancomycin  1,000 mg Intravenous Once      Assessment:  61 y.o. male admitted on 09/25/2011, with cellulitis. Pharmacy consulted to manage vancomycin. Patient received vancomycin 1gm IV x 1 at [facility] ED.    Goal of Therapy:  1. Vancomycin trough level 10-15 mcg/ml  Plan:  1. Vancomycin 1gm IV Q12H.  2. Will order vancomycin trough as indicated.    Dineen Kid Thad Ranger, PharmD 09/26/2011, 3:58 AM

## 2011-09-26 NOTE — H&P (Signed)
Hospital Admission Note Date: 09/26/2011  Patient name: Daryl Campbell Medical record number: [id number] Date of birth: 11/09/1950 Age: 61 y.o. Gender: male PCP: No primary provider on file.  Medical Service:  Attending physician:   Dr. Coralee Pesa  1st Contact:   Dr. Milbert Coulter Pager:[phone] 2nd Contact:   Dr. Anselm Jungling  [phone] After 5 pm or weekends: 1st Contact:      Pager: [phone] 2nd Contact:      Pager: [phone]  Chief Complaint: redness and swelling of R upper extremity.   History of Present Illness: The pt is a 61 YO man who comes into the ED with an area of redness and swelling on his right upper extremity. He was admitted to the hospital for a seizure and was discharged with keppra for seizures. He then noticed some redness and swelling over the site where he had an IV. This was worsening so he came to the hospital. He has had no fevers, chills, nausea, vomiting, diarrhea, abdominal pain, chest pain. He does admit to have some psychiatric problems for which he is medicated and taking some drugs including sniffing glue, PCP, ectasy but states most of this use stopped a year or so ago although admits to sniffing glue more recently. He also smokes 1 PPD of cigarettes and has for several years.   Meds: Medications Prior to Admission  Medication Dose Route Frequency Provider Last Rate Last Dose  . 0.9 %  sodium chloride infusion   Intravenous Continuous Ward Givens, MD      . vancomycin (VANCOCIN) IVPB 1000 mg/200 mL premix  1,000 mg Intravenous Once Angus Seller, PA   1,000 mg at 09/25/11 2348  . DISCONTD: acetaminophen (TYLENOL) tablet 650 mg  650 mg Oral BID PRN Lyn Hollingshead, MD   650 mg at 09/23/11 2215  . DISCONTD: aspirin EC tablet 81 mg  81 mg Oral Daily Lyn Hollingshead, MD   81 mg at 09/24/11 1014  . DISCONTD: clonazePAM (KLONOPIN) tablet 1 mg  1 mg Oral QHS Lyn Hollingshead, MD   1 mg at 09/23/11 2215  . DISCONTD: enoxaparin (LOVENOX) injection 40 mg  40 mg Subcutaneous Q24H Lyn Hollingshead, MD    40 mg at 09/23/11 2216  . DISCONTD: folic acid (FOLVITE) tablet 1 mg  1 mg Oral Daily Lyn Hollingshead, MD   1 mg at 09/24/11 1014  . DISCONTD: haloperidol (HALDOL) tablet 5 mg  5 mg Oral QHS Lyn Hollingshead, MD   5 mg at 09/23/11 2215  . DISCONTD: levETIRAcetam (KEPPRA) tablet 500 mg  500 mg Oral BID Lyn Hollingshead, MD   500 mg at 09/24/11 1014  . DISCONTD: lithium carbonate (LITHOBID) CR tablet 300 mg  300 mg Oral Q12H Lyn Hollingshead, MD   300 mg at 09/22/11 1016  . DISCONTD: metoprolol (LOPRESSOR) injection 2.5-5 mg  2.5-5 mg Intravenous Q3H PRN Lyn Hollingshead, MD      . DISCONTD: mulitivitamin with minerals tablet 1 tablet  1 tablet Oral Daily Lyn Hollingshead, MD   1 tablet at 09/24/11 1014  . DISCONTD: polyethylene glycol (MIRALAX / GLYCOLAX) packet 17 g  17 g Oral Daily PRN Benny Lennert, PHARMD      . DISCONTD: sertraline (ZOLOFT) tablet 100 mg  100 mg Oral Daily Lyn Hollingshead, MD   100 mg at 09/24/11 1014  . DISCONTD: thiamine (B-1) injection 100 mg  100 mg Intravenous Daily Lyn Hollingshead, MD      . DISCONTD: thiamine (VITAMIN B-1) tablet 100 mg  100  mg Oral Daily Lyn Hollingshead, MD   100 mg at 09/24/11 1014   Medications Prior to Admission  Medication Sig Dispense Refill  . acetaminophen (TYLENOL) 325 MG tablet Take 650 mg by mouth 2 (two) times daily as needed. For pain.      Marland Kitchen aspirin EC 81 MG tablet Take 81 mg by mouth daily.      . clonazePAM (KLONOPIN) 1 MG tablet Take 1 mg by mouth at bedtime.      . haloperidol (HALDOL) 5 MG tablet Take 5 mg by mouth at bedtime.      . levETIRAcetam (KEPPRA) 500 MG tablet Take 500 mg by mouth 2 (two) times daily.      Marland Kitchen LITHIUM CARBONATE PO Take 200 mg by mouth 3 (three) times daily. 1 tablet in the morning and 2 at bedtime      . Multiple Vitamins-Minerals (MULTIVITAMINS THER. W/MINERALS) TABS Take 1 tablet by mouth daily.      . sertraline (ZOLOFT) 100 MG tablet Take 100 mg by mouth daily.        Allergies: Review of patient's allergies indicates no known allergies. Past  Medical History  Diagnosis Date  . Hypertension   . Psychiatric disorder   . Hepatitis C   . Seizures    History reviewed. No pertinent past surgical history. No family history on file. History   Social History  . Marital Status: Single    Spouse Name: N/A    Number of Children: N/A  . Years of Education: N/A   Occupational History  . Not on file.   Social History Main Topics  . Smoking status: Current Everyday Smoker -- 1.0 packs/day for 3 years    Types: Cigarettes  . Smokeless tobacco: Never Used  . Alcohol Use: No  . Drug Use: Yes     States that he has used PCP, ectasy, THC, and sniffing glue in the past. States he still sniffs glue.  Marland Kitchen Sexually Active: Not Currently   Other Topics Concern  . Not on file   Social History Narrative  . No narrative on file    Review of Systems: Pertinent items are noted in HPI.  Physical Exam: Blood pressure 138/81, pulse 64, temperature 98 F (36.7 C), temperature source Oral, resp. rate 18, height 5\' 10"  (1.778 m), weight 186 lb 9.6 oz (84.641 kg), SpO2 96.00%. General: resting in bed, comfortable, affect is a little strange HEENT: PERRL, EOMI, no scleral icterus Cardiac: RRR, no rubs, murmurs or gallops Pulm: clear to auscultation bilaterally, moving normal volumes of air Abd: soft, nontender, nondistended, BS present Ext: warm and well perfused, no pedal edema, R upper extremity has an area of induration surrounded by perhaps 5-6 cm of erythema and slight warmth, can feel an area of hardness around where the vein would be Neuro: alert and oriented X3, cranial nerves II-XII grossly intact   Lab results: Basic Metabolic Panel:  Basename 09/25/11 2351 09/23/11 0440  NA 141 142  K 3.8 3.5  CL 106 107  CO2 -- 25  GLUCOSE 96 92  BUN 12 19  CREATININE 1.00 0.86  CALCIUM -- 9.6  MG -- --  PHOS -- --   CBC:  Basename 09/25/11 2351 09/25/11 2345 09/23/11 0440  WBC -- 6.2 7.3  NEUTROABS -- 2.5 --  HGB 14.6 14.5 --    HCT 43.0 41.6 --  MCV -- 91.2 94.3  PLT -- 184 192    Imaging results:  Bedside ultrasound performed by  Dr. Lynelle Doctor at [facility] ED documented as: Fluid collection circumferentially around the vein with some debris in the vein indicating possible clot. The vein was noncompressible.   Assessment & Plan by Problem:  *Cellulitis - Right upper extremity, received one dose of vancomycin at [facility] long ED and will continue but doubt that he needs intravenous therapy. No white blood count, no fevers. Could consider upper extremity doppler if the area is worsening. If he develops fevers could consider blood cultures.    Seizure disorder - Keppra as per home dosing. Recently started as he recently had seizure activity.   Psychiatric disorder - unspecified but will continue his home lithium, haldol, zoloft.    SignedGenella Mech 09/26/2011, 3:01 AM

## 2011-09-26 NOTE — ED Notes (Signed)
Report given to Sean, RN with [facility]. 

## 2011-09-26 NOTE — H&P (Signed)
Internal Medicine Teaching Service Attending Note Date: 09/26/2011  Patient name: Daryl Campbell  Medical record number: [id number]  Date of birth: 1951/08/23   I have seen and evaluated Daryl Campbell and discussed their care with the Residency Team.  Patient with superficial thrombophlebitis and secondary cellulitis.  Physical Exam: Blood pressure 138/63, pulse 60, temperature 98.2 F (36.8 C), temperature source Oral, resp. rate 17, height 5\' 10"  (1.778 m), weight 186 lb 9.6 oz (84.641 kg), SpO2 98.00%. In NAD, eating breakfast. Lungs-clear Heart-RRR Abdom-+BS,NT Extrem-R popliteal area with area of induration above it and demarcated area of erythema that appears to be improving  Lab results: Results for orders placed during the hospital encounter of 09/25/11 (from the past 24 hour(s))  CBC     Status: Normal   Collection Time   09/25/11 11:45 PM      Component Value Range   WBC 6.2  4.0 - 10.5 (K/uL)   RBC 4.56  4.22 - 5.81 (MIL/uL)   Hemoglobin 14.5  13.0 - 17.0 (g/dL)   HCT 74.2  59.5 - 63.8 (%)   MCV 91.2  78.0 - 100.0 (fL)   MCH 31.8  26.0 - 34.0 (pg)   MCHC 34.9  30.0 - 36.0 (g/dL)   RDW 75.6  43.3 - 29.5 (%)   Platelets 184  150 - 400 (K/uL)  DIFFERENTIAL     Status: Abnormal   Collection Time   09/25/11 11:45 PM      Component Value Range   Neutrophils Relative 40 (*) 43 - 77 (%)   Neutro Abs 2.5  1.7 - 7.7 (K/uL)   Lymphocytes Relative 44  12 - 46 (%)   Lymphs Abs 2.7  0.7 - 4.0 (K/uL)   Monocytes Relative 11  3 - 12 (%)   Monocytes Absolute 0.7  0.1 - 1.0 (K/uL)   Eosinophils Relative 4  0 - 5 (%)   Eosinophils Absolute 0.3  0.0 - 0.7 (K/uL)   Basophils Relative 0  0 - 1 (%)   Basophils Absolute 0.0  0.0 - 0.1 (K/uL)  POCT I-STAT, CHEM 8     Status: Normal   Collection Time   09/25/11 11:51 PM      Component Value Range   Sodium 141  135 - 145 (mEq/L)   Potassium 3.8  3.5 - 5.1 (mEq/L)   Chloride 106  96 - 112 (mEq/L)   BUN 12  6 - 23 (mg/dL)   Creatinine,  Ser 1.88  0.50 - 1.35 (mg/dL)   Glucose, Bld 96  70 - 99 (mg/dL)   Calcium, Ion 4.16  1.12 - 1.32 (mmol/L)   TCO2 27  0 - 100 (mmol/L)   Hemoglobin 14.6  13.0 - 17.0 (g/dL)   HCT 60.6  30.1 - 60.1 (%)  CBC     Status: Normal   Collection Time   09/26/11  4:55 AM      Component Value Range   WBC 6.0  4.0 - 10.5 (K/uL)   RBC 4.65  4.22 - 5.81 (MIL/uL)   Hemoglobin 14.7  13.0 - 17.0 (g/dL)   HCT 09.3  23.5 - 57.3 (%)   MCV 92.3  78.0 - 100.0 (fL)   MCH 31.6  26.0 - 34.0 (pg)   MCHC 34.3  30.0 - 36.0 (g/dL)   RDW 22.0  25.4 - 27.0 (%)   Platelets 183  150 - 400 (K/uL)  DIFFERENTIAL     Status: Normal   Collection Time  09/26/11  4:55 AM      Component Value Range   Neutrophils Relative 43  43 - 77 (%)   Neutro Abs 2.6  1.7 - 7.7 (K/uL)   Lymphocytes Relative 43  12 - 46 (%)   Lymphs Abs 2.6  0.7 - 4.0 (K/uL)   Monocytes Relative 11  3 - 12 (%)   Monocytes Absolute 0.6  0.1 - 1.0 (K/uL)   Eosinophils Relative 2  0 - 5 (%)   Eosinophils Absolute 0.1  0.0 - 0.7 (K/uL)   Basophils Relative 0  0 - 1 (%)   Basophils Absolute 0.0  0.0 - 0.1 (K/uL)    Imaging results:  No results found.  Assessment and Plan: I agree with the formulated Assessment and Plan with the following changes: Will discharge on doxycycline and with warm soaks.

## 2011-09-27 ENCOUNTER — Encounter (HOSPITAL_COMMUNITY): Payer: Self-pay | Admitting: Emergency Medicine

## 2011-09-27 ENCOUNTER — Emergency Department (HOSPITAL_COMMUNITY)
Admission: EM | Admit: 2011-09-27 | Discharge: 2011-09-28 | Disposition: A | Discharge status: Home / Self Care | Payer: Medicare Other | Attending: Emergency Medicine | Admitting: Emergency Medicine

## 2011-09-27 DIAGNOSIS — Z8619 Personal history of other infectious and parasitic diseases: Secondary | ICD-10-CM | POA: Insufficient documentation

## 2011-09-27 DIAGNOSIS — F17200 Nicotine dependence, unspecified, uncomplicated: Secondary | ICD-10-CM | POA: Insufficient documentation

## 2011-09-27 DIAGNOSIS — Z79899 Other long term (current) drug therapy: Secondary | ICD-10-CM | POA: Insufficient documentation

## 2011-09-27 DIAGNOSIS — G40909 Epilepsy, unspecified, not intractable, without status epilepticus: Secondary | ICD-10-CM

## 2011-09-27 DIAGNOSIS — I10 Essential (primary) hypertension: Secondary | ICD-10-CM | POA: Insufficient documentation

## 2011-09-27 HISTORY — DX: Schizophrenia, unspecified: F20.9

## 2011-09-27 HISTORY — DX: Bipolar disorder, unspecified: F31.9

## 2011-09-27 LAB — POCT I-STAT, CHEM 8
Chloride: 108 mEq/L (ref 96–112)
HCT: 39 % (ref 39.0–52.0)
Hemoglobin: 13.3 g/dL (ref 13.0–17.0)
Potassium: 3.7 mEq/L (ref 3.5–5.1)

## 2011-09-27 LAB — CBC
Platelets: 198 10*3/uL (ref 150–400)
RDW: 11.6 % (ref 11.5–15.5)
WBC: 5.9 10*3/uL (ref 4.0–10.5)

## 2011-09-27 LAB — DIFFERENTIAL
Basophils Absolute: 0 10*3/uL (ref 0.0–0.1)
Lymphocytes Relative: 45 % (ref 12–46)
Neutro Abs: 2.3 10*3/uL (ref 1.7–7.7)
Neutrophils Relative %: 39 % — ABNORMAL LOW (ref 43–77)

## 2011-09-27 LAB — LITHIUM LEVEL: Lithium Lvl: 0.6 mEq/L — ABNORMAL LOW (ref 0.80–1.40)

## 2011-09-27 MED ORDER — LITHIUM CARBONATE 150 MG PO CAPS
150.0000 mg | ORAL_CAPSULE | Freq: Once | ORAL | Status: AC
  Administered 2011-09-27: 150 mg via ORAL
  Filled 2011-09-27: qty 1

## 2011-09-27 MED ORDER — LEVETIRACETAM 500 MG PO TABS
500.0000 mg | ORAL_TABLET | Freq: Once | ORAL | Status: AC
  Administered 2011-09-27: 500 mg via ORAL
  Filled 2011-09-27 (×2): qty 1

## 2011-09-27 NOTE — ED Notes (Signed)
Per EMS pt was [facility], pt jumped up and started acting out, staff states pt not taking meds correctly. Psych hx.

## 2011-09-27 NOTE — ED Notes (Signed)
Report received from Crista RN 

## 2011-09-27 NOTE — ED Provider Notes (Signed)
History     CSN: [id number]  Arrival date & time 09/27/11  1851   First MD Initiated Contact with Patient 09/27/11 2008      Chief Complaint  Patient presents with  . Psychiatric Evaluation    (Consider location/radiation/quality/duration/timing/severity/associated sxs/prior treatment) HPI Comments: Patient transported to ED from new facility.  Unfortunately, report was not received by staff that is present.  There is no written report by EMS patient.  He had a seizure with a postictal phase and a period of confusion  The history is provided by the patient.    Past Medical History  Diagnosis Date  . Hypertension   . Psychiatric disorder   . Hepatitis C   . Seizures   . Schizophrenia   . Bipolar affective     History reviewed. No pertinent past surgical history.  History reviewed. No pertinent family history.  History  Substance Use Topics  . Smoking status: Current Everyday Smoker -- 1.0 packs/day for 3 years    Types: Cigarettes  . Smokeless tobacco: Never Used  . Alcohol Use: No      Review of Systems  Constitutional: Negative for fever and chills.  HENT: Negative for rhinorrhea and neck pain.   Respiratory: Negative for cough.   Cardiovascular: Negative for leg swelling.  Gastrointestinal: Negative for nausea.  Genitourinary: Negative for dysuria.  Skin: Negative for wound.  Neurological: Negative for dizziness.    Allergies  Review of patient's allergies indicates no known allergies.  Home Medications   Current Outpatient Rx  Name Route Sig Dispense Refill  . ASPIRIN EC 81 MG PO TBEC Oral Take 81 mg by mouth daily.    Marland Kitchen VITAMIN D 1000 UNITS PO TABS Oral Take 1,000 Units by mouth daily.    Marland Kitchen CLONAZEPAM 1 MG PO TABS Oral Take 1 mg by mouth at bedtime.    Marland Kitchen DOXYCYCLINE HYCLATE 100 MG PO TABS Oral Take 1 tablet (100 mg total) by mouth 2 (two) times daily. 28 tablet 0  . HALOPERIDOL 5 MG PO TABS Oral Take 5 mg by mouth at bedtime.    Marland Kitchen LEVETIRACETAM  500 MG PO TABS Oral Take 500 mg by mouth 2 (two) times daily.    Marland Kitchen LITHIUM CARBONATE PO Oral Take 200 mg by mouth 3 (three) times daily. 1 tablet in the morning and 2 at bedtime    . MELOXICAM 15 MG PO TABS Oral Take 1 tablet (15 mg total) by mouth daily. 15 tablet 0  . THERA M PLUS PO TABS Oral Take 1 tablet by mouth daily.    . SERTRALINE HCL 100 MG PO TABS Oral Take 100 mg by mouth daily.      BP 124/75  Pulse 61  Temp(Src) 98.2 F (36.8 C) (Oral)  Resp 19  SpO2 96%  Physical Exam  Constitutional: He is oriented to person, place, and time. He appears well-developed and well-nourished.  HENT:  Head: Normocephalic.  Eyes: Pupils are equal, round, and reactive to light.  Neck: Normal range of motion.  Cardiovascular: Normal rate.   Pulmonary/Chest: Effort normal.  Musculoskeletal: Normal range of motion.  Neurological: He is alert and oriented to person, place, and time.  Skin: Skin is warm and dry.  Psychiatric: He has a normal mood and affect.    ED Course  Procedures (including critical care time)  Labs Reviewed  CBC - Abnormal; Notable for the following:    RBC 4.09 (*)    HCT 37.4 (*)  All other components within normal limits  DIFFERENTIAL - Abnormal; Notable for the following:    Neutrophils Relative 39 (*)    Monocytes Relative 13 (*)    All other components within normal limits  LITHIUM LEVEL - Abnormal; Notable for the following:    Lithium Lvl 0.60 (*)    All other components within normal limits  POCT I-STAT, CHEM 8 - Abnormal; Notable for the following:    Glucose, Bld 108 (*)    All other components within normal limits  I-STAT, CHEM 8   No results found.   1. Seizure disorder     Patient has been observed for 4 and half hours.  No further seizure activity.  He has been given his p.m. dose of Keppra and lithium.  Will discharge patient back to his assisted living facility  MDM  Seizure        Arman Filter, NP 09/27/11 2339  Arman Filter, NP 09/27/11 [id number]

## 2011-09-27 NOTE — ED Notes (Signed)
ZOX:[id number]<AV> Expected date:09/27/11<BR> Expected time: 6:44 PM<BR> Means of arrival:Ambulance<BR> Comments:<BR> EMS 80 GC - fall

## 2011-09-27 NOTE — ED Notes (Signed)
Pt states he had a seizure today. Pt has a hx of seizures. Pt states he has been taking his keppra. Last dose taken last night. Will continue to monitor

## 2011-09-28 ENCOUNTER — Emergency Department (HOSPITAL_COMMUNITY)
Admission: EM | Admit: 2011-09-28 | Discharge: 2011-10-06 | Disposition: A | Discharge status: Other Acute Inpatient Hospital | Payer: Medicare Other | Attending: Emergency Medicine | Admitting: Emergency Medicine

## 2011-09-28 ENCOUNTER — Encounter (HOSPITAL_COMMUNITY): Payer: Self-pay | Admitting: *Deleted

## 2011-09-28 DIAGNOSIS — I10 Essential (primary) hypertension: Secondary | ICD-10-CM | POA: Insufficient documentation

## 2011-09-28 DIAGNOSIS — F17200 Nicotine dependence, unspecified, uncomplicated: Secondary | ICD-10-CM | POA: Insufficient documentation

## 2011-09-28 DIAGNOSIS — R443 Hallucinations, unspecified: Secondary | ICD-10-CM | POA: Insufficient documentation

## 2011-09-28 DIAGNOSIS — R4182 Altered mental status, unspecified: Secondary | ICD-10-CM | POA: Insufficient documentation

## 2011-09-28 DIAGNOSIS — IMO0002 Reserved for concepts with insufficient information to code with codable children: Secondary | ICD-10-CM | POA: Insufficient documentation

## 2011-09-28 DIAGNOSIS — Z8619 Personal history of other infectious and parasitic diseases: Secondary | ICD-10-CM | POA: Insufficient documentation

## 2011-09-28 DIAGNOSIS — F209 Schizophrenia, unspecified: Secondary | ICD-10-CM | POA: Insufficient documentation

## 2011-09-28 DIAGNOSIS — Z79899 Other long term (current) drug therapy: Secondary | ICD-10-CM | POA: Insufficient documentation

## 2011-09-28 LAB — RAPID URINE DRUG SCREEN, HOSP PERFORMED
Amphetamines: NOT DETECTED
Barbiturates: NOT DETECTED
Benzodiazepines: NOT DETECTED
Cocaine: NOT DETECTED
Tetrahydrocannabinol: NOT DETECTED

## 2011-09-28 LAB — COMPREHENSIVE METABOLIC PANEL
ALT: 67 U/L — ABNORMAL HIGH (ref 0–53)
Albumin: 4.5 g/dL (ref 3.5–5.2)
Alkaline Phosphatase: 80 U/L (ref 39–117)
BUN: 14 mg/dL (ref 6–23)
Chloride: 105 mEq/L (ref 96–112)
Glucose, Bld: 121 mg/dL — ABNORMAL HIGH (ref 70–99)
Potassium: 3.4 mEq/L — ABNORMAL LOW (ref 3.5–5.1)
Sodium: 141 mEq/L (ref 135–145)
Total Bilirubin: 0.8 mg/dL (ref 0.3–1.2)

## 2011-09-28 LAB — CBC
HCT: 43.2 % (ref 39.0–52.0)
Hemoglobin: 15.2 g/dL (ref 13.0–17.0)
RDW: 11.7 % (ref 11.5–15.5)
WBC: 7.1 10*3/uL (ref 4.0–10.5)

## 2011-09-28 LAB — ETHANOL: Alcohol, Ethyl (B): <11 mg/dL (ref 0–11)

## 2011-09-28 MED ORDER — SERTRALINE HCL 50 MG PO TABS
100.0000 mg | ORAL_TABLET | Freq: Every day | ORAL | Status: DC
  Administered 2011-09-28 – 2011-09-29 (×2): 100 mg via ORAL
  Filled 2011-09-28: qty 2
  Filled 2011-09-28: qty 1

## 2011-09-28 MED ORDER — HALOPERIDOL 5 MG PO TABS
5.0000 mg | ORAL_TABLET | Freq: Every day | ORAL | Status: DC
  Administered 2011-09-28 – 2011-09-29 (×3): 5 mg via ORAL
  Filled 2011-09-28 (×3): qty 1

## 2011-09-28 MED ORDER — IBUPROFEN 200 MG PO TABS
600.0000 mg | ORAL_TABLET | Freq: Three times a day (TID) | ORAL | Status: DC | PRN
  Administered 2011-10-01: 600 mg via ORAL
  Filled 2011-09-28: qty 1

## 2011-09-28 MED ORDER — ONDANSETRON HCL 4 MG PO TABS
4.0000 mg | ORAL_TABLET | Freq: Three times a day (TID) | ORAL | Status: DC | PRN
  Administered 2011-09-28: 4 mg via ORAL
  Filled 2011-09-28: qty 1

## 2011-09-28 MED ORDER — LEVETIRACETAM 500 MG PO TABS
500.0000 mg | ORAL_TABLET | Freq: Two times a day (BID) | ORAL | Status: DC
  Administered 2011-09-28 – 2011-10-06 (×15): 500 mg via ORAL
  Filled 2011-09-28 (×19): qty 1

## 2011-09-28 MED ORDER — ACETAMINOPHEN 325 MG PO TABS: 650.0000 mg | ORAL_TABLET | ORAL | Status: DC | PRN

## 2011-09-28 MED ORDER — LORAZEPAM 1 MG PO TABS
1.0000 mg | ORAL_TABLET | Freq: Three times a day (TID) | ORAL | Status: DC | PRN
  Administered 2011-09-28 – 2011-09-29 (×2): 1 mg via ORAL
  Filled 2011-09-28 (×2): qty 1

## 2011-09-28 MED ORDER — LORAZEPAM 2 MG/ML IJ SOLN
INTRAMUSCULAR | Status: AC
  Administered 2011-09-28: 16:00:00
  Filled 2011-09-28: qty 1

## 2011-09-28 MED ORDER — ZIPRASIDONE MESYLATE 20 MG IM SOLR
20.0000 mg | Freq: Once | INTRAMUSCULAR | Status: AC
  Administered 2011-09-28: 20 mg via INTRAMUSCULAR
  Filled 2011-09-28: qty 20

## 2011-09-28 MED ORDER — ZOLPIDEM TARTRATE 5 MG PO TABS
5.0000 mg | ORAL_TABLET | Freq: Every evening | ORAL | Status: DC | PRN
  Administered 2011-09-29 – 2011-10-01 (×3): 5 mg via ORAL
  Filled 2011-09-28 (×3): qty 1

## 2011-09-28 NOTE — ED Notes (Signed)
Observed pt on monitor taking all of his clothing off, pt then laying in the bed with blanket over him, reminded pt that he could not come out of his room without his clothes on pt provided with new set of scrubs pt remains laying in the bed with blanket on, will continue to monitor pt

## 2011-09-28 NOTE — ED Notes (Signed)
Pt is unable to answer questions at this time. Pt is pacing cont's and agitated. Pt's cont's to read his bible and answers all question's with "yes God". Pt does denies any pain. Pt is will not answer question about past medical history.pt states he only takes one medication. Pt is brought in by gpd.

## 2011-09-28 NOTE — ED Provider Notes (Signed)
History     CSN: [id number]  Arrival date & time 09/28/11  1354   First MD Initiated Contact with Patient 09/28/11 1502      No chief complaint on file.   (Consider location/radiation/quality/duration/timing/severity/associated sxs/prior treatment) HPI  Patient is brought to emergency department in police custody with police stating that they were called to a residential area with residents complaining that man was walking around without his pants on and wants police arrived on scene they found patient wearing "long John" type pants however both of his legs were inserted into one of the pants legs with the patient stating "yes God, yes am going crazy." Patient states that his medications are "off" and that he feels like he is going crazy. Patient is unable to remember his psychiatrist or any of his medical providers. Patient presents manic with pressured speech and delusional. Patient has no physical complaint. Level V caveat applies due to patient being a poor historian.  Past Medical History  Diagnosis Date  . Hypertension   . Psychiatric disorder   . Hepatitis C   . Seizures   . Schizophrenia   . Bipolar affective     History reviewed. No pertinent past surgical history.  No family history on file.  History  Substance Use Topics  . Smoking status: Current Everyday Smoker -- 1.0 packs/day for 3 years    Types: Cigarettes  . Smokeless tobacco: Never Used  . Alcohol Use: No      Review of Systems  Unable to perform ROS   Allergies  Review of patient's allergies indicates no known allergies.  Home Medications   Current Outpatient Rx  Name Route Sig Dispense Refill  . ASPIRIN EC 81 MG PO TBEC Oral Take 81 mg by mouth daily.    Marland Kitchen VITAMIN D 1000 UNITS PO TABS Oral Take 1,000 Units by mouth daily.    Marland Kitchen CLONAZEPAM 1 MG PO TABS Oral Take 1 mg by mouth at bedtime.    Marland Kitchen DOXYCYCLINE HYCLATE 100 MG PO TABS Oral Take 1 tablet (100 mg total) by mouth 2 (two) times daily. 28  tablet 0  . HALOPERIDOL 5 MG PO TABS Oral Take 5 mg by mouth at bedtime.    Marland Kitchen LEVETIRACETAM 500 MG PO TABS Oral Take 500 mg by mouth 2 (two) times daily.    Marland Kitchen LITHIUM CARBONATE PO Oral Take 200 mg by mouth 3 (three) times daily. 1 tablet in the morning and 2 at bedtime    . MELOXICAM 15 MG PO TABS Oral Take 1 tablet (15 mg total) by mouth daily. 15 tablet 0  . THERA M PLUS PO TABS Oral Take 1 tablet by mouth daily.    . SERTRALINE HCL 100 MG PO TABS Oral Take 100 mg by mouth daily.      BP 159/97  Pulse 71  Temp 98.2 F (36.8 C)  Resp 22  Ht 5\' 8"  (1.727 m)  Wt 180 lb (81.647 kg)  BMI 27.37 kg/m2  Physical Exam  Nursing note and vitals reviewed. Constitutional: He is oriented to person, place, and time. He appears well-developed and well-nourished. No distress.  HENT:  Head: Normocephalic and atraumatic.  Eyes: Conjunctivae and EOM are normal. Pupils are equal, round, and reactive to light.  Neck: Normal range of motion. Neck supple.  Cardiovascular: Normal rate, regular rhythm, normal heart sounds and intact distal pulses.  Exam reveals no gallop and no friction rub.   No murmur heard. Pulmonary/Chest: Effort normal and breath  sounds normal. No respiratory distress. He has no wheezes. He has no rales. He exhibits no tenderness.  Abdominal: Bowel sounds are normal. He exhibits no distension and no mass. There is no tenderness. There is no rebound and no guarding.  Musculoskeletal: Normal range of motion. He exhibits no edema and no tenderness.  Neurological: He is alert and oriented to person, place, and time.  Skin: Skin is warm and dry. No rash noted. He is not diaphoretic. No erythema.  Psychiatric: His mood appears anxious. His speech is rapid and/or pressured. He is agitated and actively hallucinating. Thought content is delusional.    ED Course  Procedures (including critical care time)  Temp psych holding orders written with telepsych consult placed. I spoke with Clydie Braun  with [facility] about patient who will evaluation once medically cleared. Patient has auditory hallucinations, speaking out to God, carrying on a conversation in which he states "yes God, You want me to kill Vivien Presto."   Labs Reviewed  CBC  COMPREHENSIVE METABOLIC PANEL  ETHANOL  ACETAMINOPHEN LEVEL  URINE RAPID DRUG SCREEN (HOSP PERFORMED)   No results found.   No diagnosis found.    MDM  Sign out given to Dr. Jacqulyn Cane, PA 09/28/11 2018

## 2011-09-28 NOTE — BH Assessment (Signed)
Assessment Note   Daryl Campbell is an 61 y.o. male. Pt brought to [facility] by [employer] after getting a call that pt was walking around in the freezing rain/snow without any pants on. During course in ED, pt makes religious remarks that he is God or that God is talking to him or asking staff inappropriate questions. Pt unable to provide answers to many of the assessment questions and answering inappropriately. Upon entering room for assessment, pt was standing in the corner with his pants off. Pt oriented to his name, the month and day, but stated the year was 32. When asked where he was, pt stood up and yelled "Father". Pt stated "I'm depressed and I want to feel better" then laughed when asked to share his symptoms. Pt stated "I have dentures" when asked about his sleeping patterns. Pt admitted to hearing voices, but could not make out what they say but confirmed they are not with command. Poor historian. Pt was previously in ED on 09/20/11 confused and mumbling, much like today. History obtained from his group home stating that he had not been taking his medications and had been huffing paint and sniffing glue. Pt reported to ED again around 09/26/11 after having a seizure, again group home stated he may have been sniffing or huffing. While speaking with ACT, pt then grew sullen and stated "I'm sorry, please forgive me" and stated he had made a bad joke. He was tearful, but immediately started laughing. Pt denies SI, HI.  Axis I: Schizophrenia (per history) Axis II: Deferred Axis III:  Past Medical History  Diagnosis Date  . Hypertension   . Psychiatric disorder   . Hepatitis C   . Seizures   . Schizophrenia   . Bipolar affective    Axis IV: housing problems and problems with access to health care services Axis V: 11-20 some danger of hurting self or others possible OR occasionally fails to maintain minimal personal hygiene OR gross impairment in communication  Past Medical History:  Past Medical  History  Diagnosis Date  . Hypertension   . Psychiatric disorder   . Hepatitis C   . Seizures   . Schizophrenia   . Bipolar affective     History reviewed. No pertinent past surgical history.  Family History: No family history on file.  Social History:  reports that he has been smoking Cigarettes.  He has a 3 pack-year smoking history. He has never used smokeless tobacco. He reports that he uses illicit drugs. He reports that he does not drink alcohol.  Additional Social History:  Alcohol / Drug Use Pain Medications: N/A Prescriptions: N/A Over the Counter: N/A History of alcohol / drug use?: Yes Substance #1 Name of Substance 1: PCP 1 - Age of First Use: Unknown - report from prior medical records 1 - Amount (size/oz): Unknown 1 - Frequency: Unknown 1 - Duration: Unknown 1 - Last Use / Amount: over year ago - per pt from prior medical record Substance #2 Name of Substance 2: Sniffing glue/huffing pain 2 - Age of First Use: Unknown - report from prior medical records 2 - Amount (size/oz): Unknown 2 - Frequency: Unknown 2 - Duration: Unknown 2 - Last Use / Amount: around 09/20/11 Allergies: No Known Allergies  Home Medications:  Medications Prior to Admission  Medication Dose Route Frequency Provider Last Rate Last Dose  . acetaminophen (TYLENOL) tablet 650 mg  650 mg Oral Q4H PRN Jenness Corner, PA      . haloperidol (  HALDOL) tablet 5 mg  5 mg Oral QHS Lenon Oms Hunt, PA   5 mg at 09/28/11 1553  . ibuprofen (ADVIL,MOTRIN) tablet 600 mg  600 mg Oral Q8H PRN Jenness Corner, PA      . levETIRAcetam (KEPPRA) tablet 500 mg  500 mg Oral Once Arman Filter, NP   500 mg at 09/27/11 2246  . levETIRAcetam (KEPPRA) tablet 500 mg  500 mg Oral BID Lenon Oms Hunt, PA      . lithium carbonate capsule 150 mg  150 mg Oral Once Arman Filter, NP   150 mg at 09/27/11 2246  . LORazepam (ATIVAN) 2 MG/ML injection           . LORazepam (ATIVAN) tablet 1 mg  1 mg Oral Q8H PRN Bethany J Hunt,  PA      . ondansetron (ZOFRAN) tablet 4 mg  4 mg Oral Q8H PRN Jenness Corner, PA      . sertraline (ZOLOFT) tablet 100 mg  100 mg Oral Daily Lenon Oms Hunt, PA   100 mg at 09/28/11 1557  . ziprasidone (GEODON) injection 20 mg  20 mg Intramuscular Once [employer], PA   20 mg at 09/28/11 1946  . zolpidem (AMBIEN) tablet 5 mg  5 mg Oral QHS PRN Jenness Corner, PA       Medications Prior to Admission  Medication Sig Dispense Refill  . aspirin EC 81 MG tablet Take 81 mg by mouth daily.      . cholecalciferol (VITAMIN D) 1000 UNITS tablet Take 1,000 Units by mouth daily.      . clonazePAM (KLONOPIN) 1 MG tablet Take 1 mg by mouth at bedtime.      Marland Kitchen doxycycline (VIBRA-TABS) 100 MG tablet Take 1 tablet (100 mg total) by mouth 2 (two) times daily.  28 tablet  0  . haloperidol (HALDOL) 5 MG tablet Take 5 mg by mouth at bedtime.      . levETIRAcetam (KEPPRA) 500 MG tablet Take 500 mg by mouth 2 (two) times daily.      Marland Kitchen LITHIUM CARBONATE PO Take 200 mg by mouth 3 (three) times daily. 1 tablet in the morning and 2 at bedtime      . meloxicam (MOBIC) 15 MG tablet Take 1 tablet (15 mg total) by mouth daily.  15 tablet  0  . Multiple Vitamins-Minerals (MULTIVITAMINS THER. W/MINERALS) TABS Take 1 tablet by mouth daily.      . sertraline (ZOLOFT) 100 MG tablet Take 100 mg by mouth daily.        OB/GYN Status:  No LMP for male patient.  General Assessment Data Location of Assessment: [facility] ED Living Arrangements: Group Home Can pt return to current living arrangement?: Yes Admission Status: Involuntary Is patient capable of signing voluntary admission?: No Transfer from: Group Home Referral Source: Other ([employer])  Education Status Is patient currently in school?: No  Risk to self Suicidal Ideation: No Suicidal Intent: No Is patient at risk for suicide?: No Suicidal Plan?: No Access to Means: No What has been your use of drugs/alcohol within the last 12 months?: sniffing glue & huffing paint - as  recent as 09/20/11 per medical records - pt unable to provide info Previous Attempts/Gestures: No How many times?: 0  Other Self Harm Risks: pt denies Triggers for Past Attempts: Unknown Intentional Self Injurious Behavior: None Family Suicide History: Unable to assess Recent stressful life event(s):  (Non-compliant w/ Rx; huffing paint) Persecutory voices/beliefs?:  No Depression: Yes Depression Symptoms:  (Pt reports but unable to give sx) Substance abuse history and/or treatment for substance abuse?:  (Unknown - pt poor historian) Suicide prevention information given to non-admitted patients: Not applicable  Risk to Others Homicidal Ideation: No Thoughts of Harm to Others: No Current Homicidal Intent: No Current Homicidal Plan: No Access to Homicidal Means: No Identified Victim: n/a History of harm to others?: No Assessment of Violence: None Noted Violent Behavior Description: n/a Does patient have access to weapons?: No Criminal Charges Pending?: No Does patient have a court date: No  Psychosis Hallucinations: Auditory (stated he hears voices but no command) Delusions: Unspecified (religious preoccupation)  Mental Status Report Appear/Hygiene: Disheveled;Other (Comment) (pt found by [employer] walking with no pants) Eye Contact: Fair Motor Activity: Restlessness Speech: Other (Comment) (Illogical - in appropriate responses) Level of Consciousness: Alert Mood: Euphoric;Elated;Labile;Sad (Labile - going from laughing to sad/tearful) Affect: Inconsistent with thought content;Labile;Euphoric Anxiety Level: None Thought Processes: Irrelevant Judgement: Impaired Orientation: Person (knew name, month & day -  but year is 52) Obsessive Compulsive Thoughts/Behaviors: None  Cognitive Functioning Concentration: Decreased Memory: Recent Impaired;Remote Impaired IQ: Average Insight: Poor Impulse Control: Poor Appetite: Fair Weight Loss: 0  Weight Gain: 0  Sleep:  (Unable to  assess) Vegetative Symptoms: Decreased grooming  Prior Inpatient Therapy Prior Inpatient Therapy: Yes Prior Therapy Dates: Pt unable to remember Prior Therapy Facilty/Provider(s): Unknown - pt states I will keep it a secret Reason for Treatment: schizophrenia  Prior Outpatient Therapy Prior Outpatient Therapy:  (Unknown - pt unable to inform)            Values / Beliefs Cultural Requests During Hospitalization: None Spiritual Requests During Hospitalization: None   Advance Directives (For Healthcare) Advance Directive: Patient does not have advance directive;Patient would not like information Pre-existing out of facility DNR order (yellow form or pink MOST form): No    Additional Information 1:1 In Past 12 Months?: No CIRT Risk: No Elopement Risk: No Does patient have medical clearance?: Yes     Disposition:  Disposition Disposition of Patient: [facility]  On Site Evaluation by:   Reviewed with Physician:     Romeo Apple 09/28/2011 7:56 PM

## 2011-09-28 NOTE — ED Notes (Signed)
Pt extremely restless. Out of room multiple times without pants on. Pt told we will not talk with him unless he is fully dressed, and he is now keeping on his paper scrubs.

## 2011-09-28 NOTE — ED Provider Notes (Addendum)
Medical screening examination/treatment/procedure(s) were performed by non-physician practitioner and as supervising physician I was immediately available for consultation/collaboration.   Benny Lennert, MD 09/28/11 2130  Telemetry psych consult performed by Dr. Trisha Mangle and report reviewed: Recommendations to stop Zoloft, start Haldol 5 mg twice a day for psychosis and start Cogentin 1 mg by mouth twice a day. It is also recommended the patient requires inpatient psychiatric hospitalization and he is not psychiatrically stable.    Patient had been given Geodon around 7 PM for psychosis. Around 1 AM patient still acting psychotic, states he is God and is acting aggressively towards staff. He was provided with Ativan and Benadryl IM.  Sunnie Nielsen, MD 09/29/11 0200

## 2011-09-28 NOTE — ED Notes (Signed)
Per ems: police was called to pt's house.pt was found to walking outside without any pants on. Pt wants to talk with someone

## 2011-09-28 NOTE — ED Notes (Signed)
Pt now back in his room actively talking with God pt reading the bible aloud door closed to the room will continue to monitor pt

## 2011-09-28 NOTE — ED Provider Notes (Signed)
Medical screening examination/treatment/procedure(s) were performed by non-physician practitioner and as supervising physician I was immediately available for consultation/collaboration.    Jaylanni Eltringham R Edith Lord, MD 09/28/11 0034 

## 2011-09-28 NOTE — ED Notes (Signed)
1900 upon arrival to the unit pt out at the desk pt hyper-religious with bible in hand. Pt then back to his room took off his pants out to the nurses station refusing to go back to his room pt yelling "God" appears to be responding to internal stimuli. Pt refusing to put clothing on and go back to his room.  Spoke with Judeth Cornfield EDP new orders received pt medicated per orders. Unable to redirect pt back to his room pt wanting to walk about the unit pt yelling "I'm dying God, Help me God" security in department to assist

## 2011-09-28 NOTE — ED Notes (Signed)
telepsych reordered was not completed per Clydie Braun Act

## 2011-09-28 NOTE — ED Provider Notes (Signed)
See prior note   Ward Givens, MD 09/28/11 0030

## 2011-09-28 NOTE — ED Notes (Signed)
Pt yelling reading from the bible,sitting on the bed in his room, unable to redirect pt. Door closed to room pt visible on the monitor will continue to monitor for safety.

## 2011-09-28 NOTE — ED Notes (Signed)
Pt c/o nausea coughing up sputum pt requesting brown bag reports that God want it, pt given a basin and tissues, pt also medicated per holding orders.

## 2011-09-29 MED ORDER — LORAZEPAM 2 MG/ML IJ SOLN
2.0000 mg | Freq: Once | INTRAMUSCULAR | Status: AC
  Administered 2011-09-29: 2 mg via INTRAMUSCULAR
  Filled 2011-09-29: qty 1

## 2011-09-29 MED ORDER — ZIPRASIDONE MESYLATE 20 MG IM SOLR
INTRAMUSCULAR | Status: AC
  Administered 2011-09-29: 20 mg via INTRAMUSCULAR
  Filled 2011-09-29: qty 20

## 2011-09-29 MED ORDER — BENZTROPINE MESYLATE 1 MG PO TABS: 1.0000 mg | ORAL_TABLET | Freq: Three times a day (TID) | ORAL | Status: DC

## 2011-09-29 MED ORDER — HALOPERIDOL LACTATE 5 MG/ML IJ SOLN
5.0000 mg | Freq: Once | INTRAMUSCULAR | Status: AC
  Administered 2011-09-29: 5 mg via INTRAMUSCULAR

## 2011-09-29 MED ORDER — BENZTROPINE MESYLATE 1 MG PO TABS
1.0000 mg | ORAL_TABLET | Freq: Two times a day (BID) | ORAL | Status: DC
  Administered 2011-09-29: 1 mg via ORAL
  Filled 2011-09-29: qty 1

## 2011-09-29 MED ORDER — HALOPERIDOL LACTATE 5 MG/ML IJ SOLN
5.0000 mg | Freq: Once | INTRAMUSCULAR | Status: AC
  Administered 2011-09-29: 5 mg via INTRAMUSCULAR
  Filled 2011-09-29: qty 1

## 2011-09-29 MED ORDER — BENZTROPINE MESYLATE 1 MG/ML IJ SOLN: 1.0000 mg | Freq: Two times a day (BID) | INTRAMUSCULAR | Status: DC

## 2011-09-29 MED ORDER — BENZTROPINE MESYLATE 1 MG/ML IJ SOLN: 2.0000 mg | Freq: Two times a day (BID) | INTRAMUSCULAR | Status: DC | PRN

## 2011-09-29 MED ORDER — LORAZEPAM 1 MG PO TABS
2.0000 mg | ORAL_TABLET | Freq: Four times a day (QID) | ORAL | Status: DC | PRN
  Administered 2011-09-29 – 2011-10-06 (×16): 2 mg via ORAL
  Filled 2011-09-29: qty 1
  Filled 2011-09-29 (×11): qty 2
  Filled 2011-09-29: qty 1
  Filled 2011-09-29: qty 2
  Filled 2011-09-29: qty 1
  Filled 2011-09-29 (×3): qty 2

## 2011-09-29 MED ORDER — HALOPERIDOL LACTATE 5 MG/ML IJ SOLN: 5.0000 mg | Freq: Four times a day (QID) | INTRAMUSCULAR | Status: DC | PRN

## 2011-09-29 MED ORDER — HALOPERIDOL 5 MG PO TABS
5.0000 mg | ORAL_TABLET | Freq: Two times a day (BID) | ORAL | Status: DC
  Administered 2011-09-29 (×2): 5 mg via ORAL
  Filled 2011-09-29 (×2): qty 1

## 2011-09-29 MED ORDER — DIPHENHYDRAMINE HCL 50 MG/ML IJ SOLN
50.0000 mg | Freq: Once | INTRAMUSCULAR | Status: AC
  Administered 2011-09-29: 50 mg via INTRAMUSCULAR
  Filled 2011-09-29: qty 1

## 2011-09-29 MED ORDER — HALOPERIDOL LACTATE 5 MG/ML IJ SOLN
5.0000 mg | Freq: Four times a day (QID) | INTRAMUSCULAR | Status: DC | PRN
  Administered 2011-09-29 – 2011-10-03 (×9): 5 mg via INTRAMUSCULAR
  Filled 2011-09-29 (×10): qty 1

## 2011-09-29 MED ORDER — ZIPRASIDONE MESYLATE 20 MG IM SOLR
20.0000 mg | Freq: Once | INTRAMUSCULAR | Status: AC
  Administered 2011-09-29: 20 mg via INTRAMUSCULAR

## 2011-09-29 MED ORDER — DIVALPROEX SODIUM 500 MG PO DR TAB
500.0000 mg | DELAYED_RELEASE_TABLET | Freq: Two times a day (BID) | ORAL | Status: DC
  Administered 2011-09-29 – 2011-10-03 (×8): 500 mg via ORAL
  Filled 2011-09-29 (×8): qty 1

## 2011-09-29 NOTE — ED Provider Notes (Signed)
Patient sleeping, wearing clothes.  Per nurse, he has been non-disruptive for several hours.  Awaiting placement.   Gerhard Munch, MD 09/29/11 [phone]

## 2011-09-29 NOTE — ED Notes (Signed)
Up to the desk, nad, remains redirectable, intermitantly smiling/laughing/staring blankly

## 2011-09-29 NOTE — ED Notes (Signed)
Pt remains awake. Reasonably cooperative w/ verbal redirection to return to room. Pt's behavior remains sexually inappropriate.

## 2011-09-29 NOTE — ED Notes (Signed)
Spoke with [facility] psychiatrist Kathrin Penner reports that he remembers the patient form earlier, Dr was given updated report of pt behaviors and medications that pt has currently received. Kathrin Penner reports that he doesn't need to do a repeat consult reports that he will fax over new medication recommendations for pt

## 2011-09-29 NOTE — ED Notes (Signed)
Puncture rt AC CDI, no swelling/drainage noted,  redness noted above site.  Upper arm soft to palpate, pt does not indicate any pain w/o use or palpation.

## 2011-09-29 NOTE — ED Notes (Signed)
Pt up to the desk asking about his false teeth.  Belongings searched-teeth (upper and lower's) located in lt coat pocket,.

## 2011-09-29 NOTE — ED Notes (Signed)
Pt up to the bathroom, incont. Of urine, clean scrubs given, pt assisted back to room and encouraged to rest. Pt occasionally calling out to God.

## 2011-09-29 NOTE — ED Notes (Signed)
Pt up to the desk, pleasant, smiling, laughing, appears to be able to focus more.  Pt reports that he is still hearing voices, but they are not as loud as they were earlier today.  Pt asking when they will go away.  Pt  reasurred that the voices will get quieter with the medications.

## 2011-09-29 NOTE — ED Notes (Signed)
Pt up to the bathroom while in the bathroom the pt undressed.  Pt unable to follow directions and was assisted back on w/ his scrubs.  While dressing pt approx 8x8x8 triangular rash noted on rt cheek.  Pt assisted back to room to eat lunch.  Pt remains pleasant/confused.

## 2011-09-29 NOTE — ED Notes (Signed)
PT DECLINED FOOD, BUT DRINKING FLUIDS W/ ASSIST W/O DIFFICULTY

## 2011-09-29 NOTE — ED Notes (Signed)
Pt in room refusing to to put his clothes back on spoke with EDP Dierdre Highman regarding behavior EDP recommends that we do another tele psych consult.

## 2011-09-29 NOTE — ED Notes (Signed)
Pt out of the room standing in the hall pt proceeds to take his shirt off. Security in the department assisted pt back to his room pt advised that he could not come out of the room with out putting clothes on.

## 2011-09-29 NOTE — ED Notes (Signed)
Pt up in room, had undressed in room and stuck his head out the door requesting his clothes.  Pt informed that he has to wear the blue clothes while he is here.  Pt verbalized understanding and closed the door, then reopened the door responding "thankyou.Marland KitchenMarland KitchenGod bless you...for all eternity."  Pt then closed the door and redressed.

## 2011-09-29 NOTE — ED Notes (Signed)
Pt took all pm meds with no problems, allowed me to dress him again and obtain VS. Pt is resting in bed at this time.

## 2011-09-29 NOTE — ED Notes (Signed)
Pt up to the desk, smiling, talking to God, pt appears to be responding to internal stimulus.  Pt began lowering his pants-would pull them back up w/ direction.  Pt redirected back to room

## 2011-09-29 NOTE — ED Notes (Signed)
Pt out in the hall without his pants on reading his bible pt redirected back to his room pt advised of limits that he could not come out in the hall without clothing security to assist.

## 2011-09-29 NOTE — ED Notes (Signed)
Pt continues to take off pants and be beligerant, screaming and unable to be redirected. MD called for orders.

## 2011-09-29 NOTE — ED Notes (Signed)
Pt up to the bathroom and became lost and was unable to find his room--redirected back to his room w/o difficulty

## 2011-09-29 NOTE — ED Notes (Signed)
Up in room, eatting breakfast

## 2011-09-29 NOTE — ED Notes (Addendum)
Pt up to the desk and reported that "I heard the voice say 3 times say God, God, God.." the patient then asked permission to do it and when the pt was reminded that he did say it he smiled, said thank-you and went back to his room

## 2011-09-30 DIAGNOSIS — R4182 Altered mental status, unspecified: Secondary | ICD-10-CM

## 2011-09-30 DIAGNOSIS — F17200 Nicotine dependence, unspecified, uncomplicated: Secondary | ICD-10-CM

## 2011-09-30 DIAGNOSIS — F209 Schizophrenia, unspecified: Secondary | ICD-10-CM

## 2011-09-30 DIAGNOSIS — R443 Hallucinations, unspecified: Secondary | ICD-10-CM

## 2011-09-30 MED ORDER — OLANZAPINE 5 MG PO TABS
5.0000 mg | ORAL_TABLET | Freq: Once | ORAL | Status: AC
  Administered 2011-09-30: 5 mg via ORAL
  Filled 2011-09-30: qty 1

## 2011-09-30 MED ORDER — DIPHENHYDRAMINE HCL 50 MG/ML IJ SOLN
50.0000 mg | Freq: Once | INTRAMUSCULAR | Status: AC
  Administered 2011-09-30: 50 mg via INTRAMUSCULAR
  Filled 2011-09-30: qty 1

## 2011-09-30 MED ORDER — BENZTROPINE MESYLATE 1 MG PO TABS
1.0000 mg | ORAL_TABLET | Freq: Two times a day (BID) | ORAL | Status: DC
  Administered 2011-09-30 – 2011-10-03 (×7): 1 mg via ORAL
  Filled 2011-09-30 (×7): qty 1

## 2011-09-30 MED ORDER — DIPHENHYDRAMINE HCL 50 MG/ML IJ SOLN
50.0000 mg | Freq: Once | INTRAMUSCULAR | Status: AC
  Administered 2011-09-30: 50 mg via INTRAVENOUS
  Filled 2011-09-30: qty 1

## 2011-09-30 MED ORDER — OLANZAPINE 5 MG PO TABS
10.0000 mg | ORAL_TABLET | Freq: Every day | ORAL | Status: DC
  Administered 2011-09-30 – 2011-10-03 (×4): 10 mg via ORAL
  Filled 2011-09-30: qty 1
  Filled 2011-09-30 (×3): qty 2

## 2011-09-30 MED ORDER — BENZTROPINE MESYLATE 1 MG/ML IJ SOLN: 1.0000 mg | Freq: Three times a day (TID) | INTRAMUSCULAR | Status: DC

## 2011-09-30 MED ORDER — HALOPERIDOL LACTATE 5 MG/ML IJ SOLN
5.0000 mg | Freq: Once | INTRAMUSCULAR | Status: AC
  Administered 2011-09-30: 5 mg via INTRAMUSCULAR
  Filled 2011-09-30: qty 1

## 2011-09-30 MED ORDER — LITHIUM CARBONATE 150 MG PO CAPS
150.0000 mg | ORAL_CAPSULE | Freq: Once | ORAL | Status: AC
  Administered 2011-09-30: 150 mg via ORAL
  Filled 2011-09-30: qty 1

## 2011-09-30 MED ORDER — LORAZEPAM 2 MG/ML IJ SOLN
2.0000 mg | Freq: Once | INTRAMUSCULAR | Status: AC
  Administered 2011-09-30: 2 mg via INTRAMUSCULAR
  Filled 2011-09-30: qty 1

## 2011-09-30 MED ORDER — BENZTROPINE MESYLATE 1 MG PO TABS: 1.0000 mg | ORAL_TABLET | Freq: Three times a day (TID) | ORAL | Status: DC

## 2011-09-30 NOTE — ED Notes (Signed)
Sleeping, resp even and unlabored, arousable, pos distal pulses all extremities, skin w/d, rt ankle restraint removed

## 2011-09-30 NOTE — ED Notes (Signed)
Pt arousable, MAE x4, essentially non-verbal responses to questions, but will respond w/ one word answers occasionally. Pt eyes track at times, but has blank stare at times as well.  Feed 3/4 banana w/ OJ w/o difficulty.  Procedures explained, resting quielty.

## 2011-09-30 NOTE — ED Notes (Signed)
Sleeping, resp evena dn unlabored, moves extremities w/ touch, pos pulses distal to restraints all extremites, hands/feet w/d, dignity maintained, restraints intact

## 2011-09-30 NOTE — ED Notes (Signed)
Pt ate approx  50% of lunch w/ assist.

## 2011-09-30 NOTE — ED Notes (Signed)
PA Shelda Jakes has returned call with suggestions for psych orders. After speaking with me she was transferred to Dr. Bebe Shaggy to relay same. Charge RN informed.

## 2011-09-30 NOTE — ED Notes (Signed)
Daryl Hausen RN spoke with Dr. Kathrin Penner (tele-psych [facility]) regarding pt's increasing mania. Dr. Kathrin Penner recommended that pt have another tele-psych at 10 am today for re-evaluation.

## 2011-09-30 NOTE — ED Notes (Addendum)
Pt sleeping, resp even and unlabored, moves extremities when touched, lt ankle restraint removed.

## 2011-09-30 NOTE — ED Provider Notes (Addendum)
Patient has now received his one 5 mg by mouth dose of Zyprexa for the day.  Patient continued to be agitated following Zyprexa. He required dosing with Benadryl 50 mg I am, Ativan 2 mg IM, and Haldol 5 mg IM. Patient was striking his head on the wall and also required restraints. Patient was reassessed following this and found to be stable and sleeping comfortably.  Cyndra Numbers, MD 09/30/11 2010  Cyndra Numbers, MD 10/01/11 0102  Filed Vitals:   10/02/11 0641  BP: 142/68  Pulse: 78  Temp: 97.9 F (36.6 C)  Resp: 20   Patient remains hemodynamically stable. He continues to require medications frequently for chemical restraint given the degree of his misbehavior.  Cyndra Numbers, MD 10/02/11 [phone]

## 2011-09-30 NOTE — ED Notes (Signed)
Dr. Bebe Shaggy in to assess pt. Pt to be placed in restraints and medicated with Haldol and Ativan.
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
## 2011-09-30 NOTE — ED Notes (Signed)
Pt has continued with pacing halls, in and out of room. Refuses to put on pants and at times takes off shirt as well. Continues to talk to "God" and answers himself as "God". Stated he wanted to take a shower but when given supplies and brought to restroom to assist he then refused. Pt is not redirectable, will not comply with requests and medications have had no affect.

## 2011-09-30 NOTE — ED Notes (Signed)
resp even and unlabored, sleeping, moves extremities w/ touch, distal extremities WD w/ pos pulses, restraints intact, dignity maintained

## 2011-09-30 NOTE — ED Provider Notes (Signed)
10:20 AM I spoke with telepsychiatry who recommends replacing his Haldol with zyprexa 10 mg daily with 5mg  PRN agitation x 1 in 24 hours (max dose 15mg  daily). She also recommends cogentin BID.   Lyanne Co, MD 09/30/11 1021

## 2011-09-30 NOTE — Progress Notes (Signed)
Pt is not able to be assessed at this time. Pt is increasingly manic, wandering the hall, removing clothing, uttering nonsensical phrases.

## 2011-09-30 NOTE — ED Provider Notes (Signed)
BP 124/82  Pulse 64  Temp(Src) 98.4 F (36.9 C) (Oral)  Resp 18  Ht 5\' 8"  (1.727 m)  Wt 180 lb (81.647 kg)  BMI 27.37 kg/m2  SpO2 96% Labs reviewed Pt agiated,pacing hallways, yelling and hitting doors He has long h/o psychosis, he is displaying these at this time He has been given multiple doses of BDZ and haldol without effect Due to concern for safety, restraints ordered and further meds for his agitation Police at bedside Safety maintained D/w ACT, still awaiting placement, may need [facility] Previous records reviewed  CRITICAL CARE Performed by: Joya Gaskins   Total critical care time: 35  Critical care time was exclusive of separately billable procedures and treating other patients.  Critical care was necessary to treat or prevent imminent or life-threatening deterioration.  Critical care was time spent personally by me on the following activities: development of treatment plan with patient and/or surrogate as well as nursing, discussions with consultants, evaluation of patient's response to treatment, examination of patient, obtaining history from patient or surrogate, ordering and performing treatments and interventions, ordering and review of laboratory studies, ordering and review of radiographic studies, pulse oximetry and re-evaluation of patient's condition.   Joya Gaskins, MD 09/30/11 [phone]

## 2011-09-30 NOTE — ED Notes (Signed)
Tammy-pharmacy tech contacted and will attempt to verify lithium dose

## 2011-09-30 NOTE — ED Notes (Signed)
Sleeping, resp even and unlabored, moves all extremities when touched, pos pulses distal all extremities, hans w/d, dignity maintained

## 2011-09-30 NOTE — ED Notes (Signed)
Sleeping, resp even and unlabored, pulses pos skin w/d distal to restrains, skin intact, lt wrist restrain removed

## 2011-09-30 NOTE — ED Notes (Signed)
Pt has urinated on self again, poured his drink all over floor and crushed his cup. Table, chair, crayons, paper, remote removed from room. Floor wiped with blanket, awaiting housekeeping to mop. Pt has been provided with more scrub pants but refuses to put them on. Pt currently standing in room half naked looking out the window of his room. Meds have had no affect.

## 2011-09-30 NOTE — ED Notes (Addendum)
Charge RN Sharol Harness here to assess pt's status. Pt continues to display increasingly manic behavior. Pt's furniture has been removed from room for his safety. He had previously taken his mattress off and put it on the floor. Call placed to [facility] for psych assistance by Charge RN. Awaiting return call for new orders.

## 2011-09-30 NOTE — ED Notes (Signed)
Sleeping, resp even and unlabored, pos pulses all extremities, no violent outburst since shift change, rt wrist restraint  Removed, skin intact, moving all extremities w/o difficulty, pulses intact.

## 2011-09-30 NOTE — BH Assessment (Signed)
Assessment Note   Daryl Campbell is an 61 y.o. male.   Pt psychotic, loud, grandiose, not wearing his pants, making God related statements.  Became defiant last night and was medicated and has been in restraints until this AM.  Pt has been non-responsive for the AM.  Pt has already been assessed by other ACT professionals.  Pt needs Inptx due to psychosis and inability to effectively care and advocate for self.    Axis I: Schizoaffective Disorder Axis II: Deferred Axis III:  Past Medical History  Diagnosis Date  . Hypertension   . Psychiatric disorder   . Hepatitis C   . Seizures   . Schizophrenia   . Bipolar affective    Axis IV: economic problems, housing problems, occupational problems, other psychosocial or environmental problems, problems related to social environment and problems with primary support group Axis V: 21-30 behavior considerably influenced by delusions or hallucinations OR serious impairment in judgment, communication OR inability to function in almost all areas  Past Medical History:  Past Medical History  Diagnosis Date  . Hypertension   . Psychiatric disorder   . Hepatitis C   . Seizures   . Schizophrenia   . Bipolar affective     History reviewed. No pertinent past surgical history.  Family History: No family history on file.  Social History:  reports that he has been smoking Cigarettes.  He has a 3 pack-year smoking history. He has never used smokeless tobacco. He reports that he uses illicit drugs. He reports that he does not drink alcohol.  Additional Social History:  Alcohol / Drug Use Pain Medications: N/A Prescriptions: N/A Over the Counter: N/A History of alcohol / drug use?: Yes Substance #1 Name of Substance 1: PCP 1 - Age of First Use: Unknown - report from prior medical records 1 - Amount (size/oz): Unknown 1 - Frequency: Unknown 1 - Duration: Unknown 1 - Last Use / Amount: over year ago - per pt from prior medical record Substance  #2 Name of Substance 2: Sniffing glue/huffing pain 2 - Age of First Use: Unknown - report from prior medical records 2 - Amount (size/oz): Unknown 2 - Frequency: Unknown 2 - Duration: Unknown 2 - Last Use / Amount: around 09/20/11 Allergies: No Known Allergies  Home Medications:  Medications Prior to Admission  Medication Dose Route Frequency Provider Last Rate Last Dose  . acetaminophen (TYLENOL) tablet 650 mg  650 mg Oral Q4H PRN Jenness Corner, PA      . benztropine (COGENTIN) tablet 1 mg  1 mg Oral BID Lyanne Co, MD   1 mg at 09/30/11 1123  . diphenhydrAMINE (BENADRYL) injection 50 mg  50 mg Intramuscular Once Sunnie Nielsen, MD   50 mg at 09/29/11 0157  . diphenhydrAMINE (BENADRYL) injection 50 mg  50 mg Intravenous Once Joya Gaskins, MD   50 mg at 09/30/11 0612  . divalproex (DEPAKOTE) DR tablet 500 mg  500 mg Oral Q12H Gerhard Munch, MD   500 mg at 09/30/11 1122  . haloperidol (HALDOL) tablet 5 mg  5 mg Oral QHS Lenon Oms Hunt, PA   5 mg at 09/29/11 2125  . haloperidol lactate (HALDOL) injection 5 mg  5 mg Intramuscular Once Sunnie Nielsen, MD   5 mg at 09/29/11 1610  . haloperidol lactate (HALDOL) injection 5 mg  5 mg Intramuscular Q6H PRN Loren Racer, MD   5 mg at 09/30/11 0521  . haloperidol lactate (HALDOL) injection 5 mg  5  mg Intramuscular Once Loren Racer, MD   5 mg at 09/29/11 2256  . haloperidol lactate (HALDOL) injection 5 mg  5 mg Intramuscular Once Joya Gaskins, MD   5 mg at 09/30/11 0606  . ibuprofen (ADVIL,MOTRIN) tablet 600 mg  600 mg Oral Q8H PRN Jenness Corner, PA      . levETIRAcetam (KEPPRA) tablet 500 mg  500 mg Oral Once Arman Filter, NP   500 mg at 09/27/11 2246  . levETIRAcetam (KEPPRA) tablet 500 mg  500 mg Oral BID Lenon Oms Hunt, PA   500 mg at 09/30/11 1121  . lithium carbonate capsule 150 mg  150 mg Oral Once Arman Filter, NP   150 mg at 09/27/11 2246  . lithium carbonate capsule 150 mg  150 mg Oral Once Joya Gaskins, MD   150 mg  at 09/30/11 0353  . LORazepam (ATIVAN) 2 MG/ML injection           . LORazepam (ATIVAN) injection 2 mg  2 mg Intramuscular Once Sunnie Nielsen, MD   2 mg at 09/29/11 0156  . LORazepam (ATIVAN) injection 2 mg  2 mg Intramuscular Once Loren Racer, MD   2 mg at 09/29/11 2258  . LORazepam (ATIVAN) injection 2 mg  2 mg Intramuscular Once Joya Gaskins, MD   2 mg at 09/30/11 1610  . LORazepam (ATIVAN) tablet 2 mg  2 mg Oral Q6H PRN Loren Racer, MD   2 mg at 09/30/11 0521  . OLANZapine (ZYPREXA) tablet 10 mg  10 mg Oral Daily Lyanne Co, MD   10 mg at 09/30/11 1123  . ondansetron (ZOFRAN) tablet 4 mg  4 mg Oral Q8H PRN Jenness Corner, PA   4 mg at 09/28/11 2343  . ziprasidone (GEODON) injection 20 mg  20 mg Intramuscular Once [employer], PA   20 mg at 09/28/11 1946  . ziprasidone (GEODON) injection 20 mg  20 mg Intramuscular Once Loren Racer, MD   20 mg at 09/29/11 2102  . zolpidem (AMBIEN) tablet 5 mg  5 mg Oral QHS PRN Jenness Corner, PA   5 mg at 09/29/11 2118  . DISCONTD: benztropine (COGENTIN) tablet 1 mg  1 mg Oral BID Gerhard Munch, MD   1 mg at 09/29/11 1108  . DISCONTD: benztropine (COGENTIN) tablet 1 mg  1 mg Oral TID Gerhard Munch, MD      . DISCONTD: benztropine (COGENTIN) tablet 1 mg  1 mg Oral TID Lyanne Co, MD      . DISCONTD: benztropine mesylate (COGENTIN) injection 1 mg  1 mg Intramuscular BID Sunnie Nielsen, MD      . DISCONTD: benztropine mesylate (COGENTIN) injection 1 mg  1 mg Intramuscular TID Lyanne Co, MD      . DISCONTD: benztropine mesylate (COGENTIN) injection 2 mg  2 mg Intramuscular BID PRN Loren Racer, MD      . DISCONTD: haloperidol (HALDOL) tablet 5 mg  5 mg Oral BID Sunnie Nielsen, MD   5 mg at 09/29/11 2117  . DISCONTD: haloperidol lactate (HALDOL) injection 5 mg  5 mg Intravenous Q6H PRN Loren Racer, MD      . DISCONTD: LORazepam (ATIVAN) tablet 1 mg  1 mg Oral Q8H PRN Jenness Corner, PA   1 mg at 09/29/11 1320  . DISCONTD:  sertraline (ZOLOFT) tablet 100 mg  100 mg Oral Daily Jenness Corner, PA   100 mg at 09/29/11 1108   Medications  Prior to Admission  Medication Sig Dispense Refill  . aspirin EC 81 MG tablet Take 81 mg by mouth daily.      . cholecalciferol (VITAMIN D) 1000 UNITS tablet Take 1,000 Units by mouth daily.      . clonazePAM (KLONOPIN) 1 MG tablet Take 1 mg by mouth at bedtime.      Marland Kitchen doxycycline (VIBRA-TABS) 100 MG tablet Take 1 tablet (100 mg total) by mouth 2 (two) times daily.  28 tablet  0  . haloperidol (HALDOL) 5 MG tablet Take 5 mg by mouth at bedtime.      . levETIRAcetam (KEPPRA) 500 MG tablet Take 500 mg by mouth 2 (two) times daily.      Marland Kitchen LITHIUM CARBONATE PO Take 200 mg by mouth 2 (two) times daily. 1 tablet in the morning and 2 at bedtime      . meloxicam (MOBIC) 15 MG tablet Take 1 tablet (15 mg total) by mouth daily.  15 tablet  0  . Multiple Vitamins-Minerals (MULTIVITAMINS THER. W/MINERALS) TABS Take 1 tablet by mouth daily.      . sertraline (ZOLOFT) 100 MG tablet Take 100 mg by mouth daily.        OB/GYN Status:  No LMP for male patient.  General Assessment Data Location of Assessment: [facility] ACT Assessment: Yes Living Arrangements: Group Home Can pt return to current living arrangement?: Yes Admission Status: Involuntary Is patient capable of signing voluntary admission?: No Transfer from: Group Home Referral Source: Other  Education Status Is patient currently in school?: No  Risk to self Suicidal Ideation: No Suicidal Intent: No Is patient at risk for suicide?: No Suicidal Plan?: No Access to Means: No What has been your use of drugs/alcohol within the last 12 months?: sniffing glue? Previous Attempts/Gestures: No How many times?: 0  Other Self Harm Risks: pt denies Triggers for Past Attempts: Unknown Intentional Self Injurious Behavior: None Family Suicide History: Unknown Recent stressful life event(s): Conflict (Comment);Turmoil (Comment);Recent  negative physical changes Persecutory voices/beliefs?: Yes Depression: Yes Depression Symptoms: Feeling angry/irritable;Feeling worthless/self pity;Loss of interest in usual pleasures;Isolating;Despondent Substance abuse history and/or treatment for substance abuse?: No Suicide prevention information given to non-admitted patients: Not applicable  Risk to Others Homicidal Ideation: No-Not Currently/Within Last 6 Months Thoughts of Harm to Others: No-Not Currently Present/Within Last 6 Months Current Homicidal Intent: No-Not Currently/Within Last 6 Months Current Homicidal Plan: No-Not Currently/Within Last 6 Months Access to Homicidal Means: No Identified Victim: 0 History of harm to others?: No Assessment of Violence: In distant past Violent Behavior Description: unknown; pt unpredictable and psychotic Does patient have access to weapons?: Yes (Comment) (unknown due to being homeless and having access to things) Criminal Charges Pending?: No Does patient have a court date: No  Psychosis Hallucinations: Auditory;Visual;With command (pt loud, been restrained, delusional, AVH) Delusions: Unspecified;Persecutory;Grandiose  Mental Status Report Appear/Hygiene: Bizarre;Disheveled;Revealing clothes/seductive clothing (won't keep pants on; nudity) Eye Contact: Poor Motor Activity: Other (Comment) (pt just staring and not making eye contact) Speech: Other (Comment) (not speaking) Level of Consciousness: Quiet/awake;Drowsy Mood: Depressed;Anxious;Suspicious;Empty Affect: Angry;Anxious;Depressed;Frightened;Inconsistent with thought content;Labile;Sad Anxiety Level: Minimal Thought Processes: Tangential Judgement: Impaired Orientation: Person Obsessive Compulsive Thoughts/Behaviors: Minimal  Cognitive Functioning Concentration: Decreased Memory: Recent Impaired;Remote Impaired IQ: Average Insight: Poor Impulse Control: Poor Appetite: Poor (not eating right now in ER; was eating   09-29-11) Weight Loss: 0  Weight Gain: 0  Sleep: Increased (in ER and withdrawn after being restrained) Total Hours of Sleep: 10  Vegetative Symptoms: Staying  in bed (staying in bed; withdrawn since AM)  Prior Inpatient Therapy Prior Inpatient Therapy: Yes Prior Therapy Dates: Pt unable to remember Prior Therapy Facilty/Provider(s): Unknown - pt states I will keep it a secret Reason for Treatment: schizophrenia  Prior Outpatient Therapy Prior Outpatient Therapy: No  ADL Screening (condition at time of admission) Patient's cognitive ability adequate to safely complete daily activities?: No Patient able to express need for assistance with ADLs?: No Independently performs ADLs?: No Communication: Needs assistance Is this a change from baseline?: Change from baseline, expected to last <3 days Dressing (OT): Needs assistance Is this a change from baseline?: Change from baseline, expected to last <3days Grooming: Needs assistance Is this a change from baseline?: Change from baseline, expected to last <3 days Feeding: Needs assistance Is this a change from baseline?: Change from baseline, expected to last <3 days Bathing: Needs assistance Is this a change from baseline?: Change from baseline, expected to last <3 days Toileting: Needs assistance Is this a change from baseline?: Change from baseline, expected to last <3 days In/Out Bed: Needs assistance Is this a change from baseline?: Change from baseline, expected to last <3 days Walks in Home: Needs assistance Is this a change from baseline?: Change from baseline, expected to last <3 days Weakness of Legs: None Weakness of Arms/Hands: None  Home Assistive Devices/Equipment Home Assistive Devices/Equipment: None  Therapy Consults (therapy consults require a physician order) PT Evaluation Needed: No OT Evalulation Needed: No SLP Evaluation Needed: No Abuse/Neglect Assessment (Assessment to be complete while patient is  alone) Physical Abuse: Denies Verbal Abuse: Denies Sexual Abuse: Denies Exploitation of patient/patient's resources: Denies Self-Neglect: Denies Values / Beliefs Cultural Requests During Hospitalization: None Spiritual Requests During Hospitalization: None Consults Spiritual Care Consult Needed: No Social Work Consult Needed: Yes (Comment) Advance Directives (For Healthcare) Advance Directive: Patient does not have advance directive;Patient would not like information Pre-existing out of facility DNR order (yellow form or pink MOST form): No Nutrition Screen Diet: Regular Unintentional weight loss greater than 10lbs within the last month: No Dysphagia: No Home Tube Feeding or Total Parenteral Nutrition (TPN): No Patient appears severely malnourished: No Pregnant or Lactating: No Dietitian Consult Needed: No  Additional Information 1:1 In Past 12 Months?: No CIRT Risk: No Elopement Risk: No Does patient have medical clearance?: Yes     Disposition:   Pt being [facility] by [facility] for 400 hall psych bed but none are available.  No other appropriate beds available in area Disposition Disposition of Patient: Inpatient treatment program Type of inpatient treatment program: Adult Patient referred to:  [facility] 400 hall bed)  On Site Evaluation by:   Reviewed with Physician:     Titus Mould, Eppie Gibson 09/30/2011 2:06 PM

## 2011-09-30 NOTE — ED Notes (Signed)
Sleeping, resp even and unlabored, skin intact, pos pulses all extremities distally, moves extremities freely, dignity maintained
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
## 2011-09-30 NOTE — ED Notes (Addendum)
Contacted [employer] PA form BH, informed that pt and his medication regimen needs to be reevaluated, pt for 3 days now with manic behavior, constanly escalating, not able to be corrected, medications are given multiple times, no improvement, at this time bed is out of the room, pt took mattress off the bed and placed it on the floor, PA informed of this, states will read over the pt's chart and will follow up.

## 2011-10-01 MED ORDER — ZIPRASIDONE MESYLATE 20 MG IM SOLR
20.0000 mg | Freq: Once | INTRAMUSCULAR | Status: AC
  Administered 2011-10-01: 20 mg via INTRAMUSCULAR
  Filled 2011-10-01: qty 20

## 2011-10-01 MED ORDER — DIPHENHYDRAMINE HCL 50 MG/ML IJ SOLN
50.0000 mg | Freq: Once | INTRAMUSCULAR | Status: AC
  Administered 2011-10-01: 50 mg via INTRAMUSCULAR
  Filled 2011-10-01: qty 1

## 2011-10-01 NOTE — ED Notes (Signed)
Dr. Lynelle Doctor informed of patients VS (pulse 51) and status. Pt in no distress.

## 2011-10-01 NOTE — ED Notes (Signed)
Pt urinated in bed. Was forced into shower.  Refused to wash but water was turned on him and soap was applied.  Pt refused to wash.  Was rinsed and dried by this nurse, placed in new scrubs and complete bed change was done.  Pt would not willingly ambulate to room so had to be escorted and placed back on stretcher.

## 2011-10-01 NOTE — ED Notes (Signed)
CSW contacted Sherin Quarry (pt's legal guardian) of [place] Sh[place]. DSS [phone]) in order to inform her of the pt's stay in the [facility]. CSW is currently awaiting a return call from Ms. Lackey.
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
## 2011-10-01 NOTE — ED Notes (Signed)
Spoke with Dr. Alto Denver to inform of pt's status. MD to assess pt shortly.

## 2011-10-01 NOTE — BHH Counselor (Signed)
Called the adult unit and spoke to the unit secretary Shadei. She sts that Bernerd Limbo which is also the person on call for today received my previous message regarding this patient's medical clearance issues. Shadei sts that Seward Grater stated that she would call me back when she received the message. As of 10/01/2011 1316 writer has not received any call from [facility] regarding this patients disposition. Writer will continue to follow-up and/or call Maggie back if writer does not receiveany updated information regarding this patients disposition.  At this time patients disposition is pending.

## 2011-10-01 NOTE — BHH Counselor (Signed)
Informed pt's nurse-Mike that Seward Grater has reviewed this patient's history on basis of request for admission to Adult Psychiatry. At this point Seward Grater notes that patient does does not appear to be stable and medically cleared. She also notes that he was admitted on 1/17 for seizure management and he currently does not appear to be on the Keppra previously Rx for seizure management. He has been in restraints within the past 24 hrs. Maggie, NP will review when stabilized. (see sticky note under physicians notes)  EDP-Zakowski also notified of the above concerns expressed by Lynann Bologna, NP. Dr. Jodelle Gross sts that he is unable to see sticky notes written by Lynann Bologna, NP. Dr. Jodelle Gross sts that patient is medically cleared and is in fact on at least 2 meds for seizures.   Writer contacted the adult unit and spoke to the unit secretary, Seward Grater is unavailable and in a treatment team meeting. The secretary was asked to give Seward Grater a message which informed her that Dr. Jodelle Gross feels that patient is stable on the appropriate seizure meds. Writer asked the secretary to have Maggie call the EDP directly with questions/concerns.

## 2011-10-01 NOTE — ED Notes (Signed)
Pt lying on bed, staring up at ceiling. Pt is quiet except for intermittently yelling out "GOD!". Pt resp even and unlabored. No distress noted.

## 2011-10-01 NOTE — ED Notes (Signed)
Pt urinated on floor, refuses to put on clothes, agitated, not cooperating with staff.  Redirected back to his stretcher and was dressed by this nurse however has since removed his scrubs.  Pt once again redirected back to stretcher and continues to remove scrubs.

## 2011-10-01 NOTE — ED Notes (Signed)
Pt compliant with meds after much encouragement from RN. Pt swallows meds without a problem and drinks of water. Pt requires assistance from staff to sit up to take meds

## 2011-10-01 NOTE — ED Notes (Signed)
While this tech was cleaning an empty room patient left his room naked. I encouraged pt back into his room and helped him back into his paper scrubs. Patient then asked "who are you mad at?" I told pt I was not mad. He than starts yelling out "God, God, God." Encouraged pt back into bed once more.

## 2011-10-01 NOTE — ED Notes (Signed)
Pt sleeping soundly; restraints removed.

## 2011-10-01 NOTE — ED Notes (Signed)
Pt uncovered himself throwing the blankets on the floor.  Refuses to stay dressed.
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
## 2011-10-02 NOTE — ED Notes (Signed)
Pt destroyed diaper, tearing it into shreds after it was wet. Pt was assisted out of bed and entire bed, diaper and gown changed. Pt requires direction and physical assistance during entire process. Pt assisted back to bed, given another 240 mL water. Covered with blanket. Will continue to monitor.

## 2011-10-02 NOTE — ED Notes (Signed)
Pt was assisted with ambulation to bathroom where he urinated on the floor then ripped down the shower curtain.  Pt was then escorted back to his room where he continued to yell and scream for God.

## 2011-10-02 NOTE — ED Notes (Signed)
Pt continues to scream out on occasion, lying in bed, continues to remove clothing and is presently naked. Attempts to cover pt are futile at present time, pt continues to remove gown, sheet and blanket any time staff covers him up. Pt has also removed diaper and urinated on bed and floor. Allows staff to change sheet, but requires much prompting and physical assistance to roll over, as he pushes back and yells out. Presently clean, dry and naked.

## 2011-10-02 NOTE — ED Notes (Signed)
Patient still sleeping at this time.  

## 2011-10-02 NOTE — ED Notes (Signed)
Pt has been awake for awhile and talking to himself. Occasionally he will scream out "God!!!!" very loudly. Other times he growls very loudly like an animal and now has begun beating on the wall of the room with his fist. It is very difficult to redirect pt, as he gets fixated on what he is currently doing. Pt is compliant with PO and IM meds. Writer is also pushing fluid, pt drinks water.

## 2011-10-03 LAB — COMPREHENSIVE METABOLIC PANEL
AST: 101 U/L — ABNORMAL HIGH (ref 0–37)
Albumin: 3.4 g/dL — ABNORMAL LOW (ref 3.5–5.2)
Alkaline Phosphatase: 76 U/L (ref 39–117)
BUN: 12 mg/dL (ref 6–23)
Chloride: 108 mEq/L (ref 96–112)
Potassium: 3.6 mEq/L (ref 3.5–5.1)
Total Bilirubin: 0.7 mg/dL (ref 0.3–1.2)

## 2011-10-03 MED ORDER — DOXYCYCLINE HYCLATE 100 MG PO TABS
100.0000 mg | ORAL_TABLET | Freq: Two times a day (BID) | ORAL | Status: DC
  Administered 2011-10-03 – 2011-10-06 (×5): 100 mg via ORAL
  Filled 2011-10-03 (×7): qty 1

## 2011-10-03 MED ORDER — CLONAZEPAM 0.5 MG PO TABS
1.0000 mg | ORAL_TABLET | Freq: Every day | ORAL | Status: DC
  Administered 2011-10-03 – 2011-10-05 (×3): 1 mg via ORAL
  Filled 2011-10-03 (×5): qty 1

## 2011-10-03 MED ORDER — ZIPRASIDONE HCL 20 MG PO CAPS
80.0000 mg | ORAL_CAPSULE | Freq: Two times a day (BID) | ORAL | Status: DC
  Administered 2011-10-03 – 2011-10-04 (×2): 80 mg via ORAL
  Filled 2011-10-03 (×2): qty 4
  Filled 2011-10-03: qty 3

## 2011-10-03 NOTE — ED Notes (Signed)
Pt has been incontinent of bowel and bladder. Pt has large liquid BM and large amount of foul smelling urine. Pt continues to require 2 person assist and is total care in cleaning himself. Pt able to roll side to side, but requires staff assistance, and pt is resistive at times. He continually reaches to touch or grab staff (pockets, hair, badge) and must be redirected and prompted almost continuously. Skin care provided.

## 2011-10-03 NOTE — ED Notes (Signed)
Pt removed diaper, urinated on self and stretcher.  Pt was washed up and complete linen change provided.

## 2011-10-03 NOTE — ED Notes (Signed)
Doxycycline ordered for dx cellulitis to arm last week. SOC concerned for possible sepsis related to it. Cellulitis not outwardly apparent at this time.

## 2011-10-03 NOTE — ED Notes (Signed)
Pt incontinent of urine and has ripped his diaper to shreds. Requires 2 person assistance to change bed, clean pt and transfer to chair and back. Pt requires much verbal prompting and physical direction. Pt is awake and alert, talking but does not make sense. Assisted back to bed by two RNs, staff has to pick up his legs and put him in bed because pt is unable to follow directions. He is able to move his body independently, but not to follow directions.

## 2011-10-03 NOTE — ED Notes (Signed)
Patient got out of bed and crawled onto floor. RN, tech and security assisted patient back into bed.

## 2011-10-03 NOTE — BH Assessment (Signed)
Assessment Note   Daryl Campbell is an 61 y.o. male. Pt brought to [facility] on 09/28/11 by [employer] after getting a call that pt was walking around in the freezing rain/snow without any pants on. During course in ED, pt makes religious remarks that he is God or that God is talking to him or asking staff inappropriate questions. Pt unable to provide answers to many of the assessment questions and answering inappropriately. Upon entering room for assessment, pt was standing in the corner with his pants off. Pt oriented to his name, the month and day, but stated the year was 55. When asked where he was, pt stood up and yelled "Father". Pt stated "I'm depressed and I want to feel better" then laughed when asked to share his symptoms. Pt stated "I have dentures" when asked about his sleeping patterns. Pt admitted to hearing voices, but could not make out what they say but confirmed they are not with command. Poor historian. Pt was previously in ED on 09/20/11 confused and mumbling, much like today. History obtained from his group home stating that he had not been taking his medications and had been huffing paint and sniffing glue. Pt reported to ED again around 09/26/11 after having a seizure, again group home stated he may have been sniffing or huffing. While speaking with ACT, pt then grew sullen and stated "I'm sorry, please forgive me" and stated he had made a bad joke. He was tearful, but immediately started laughing. Pt denies SI, HI.  Upon re-evaluation today, pt stares blankly responding to very few questions. When responding, pt is not logical or coherent. Pt knows his name and randomly screams out "God". At one point, pt covered his head with the bed sheet stating "he gave it to me as a gift" but was not able to clarify or explain comment. Pt is on wait list at [facility]. Will request telepsych for review of meds.   Axis I: Schizophrenia, Disorganized Type Axis II: Deferred Axis III:  Past Medical History  Diagnosis  Date  . Hypertension   . Psychiatric disorder   . Hepatitis C   . Seizures   . Schizophrenia   . Bipolar affective    Axis IV: other psychosocial or environmental problems, problems related to social environment, problems with access to health care services and problems with primary support group Axis V: 11-20 some danger of hurting self or others possible OR occasionally fails to maintain minimal personal hygiene OR gross impairment in communication  Past Medical History:  Past Medical History  Diagnosis Date  . Hypertension   . Psychiatric disorder   . Hepatitis C   . Seizures   . Schizophrenia   . Bipolar affective     History reviewed. No pertinent past surgical history.  Family History: No family history on file.  Social History:  reports that he has been smoking Cigarettes.  He has a 3 pack-year smoking history. He has never used smokeless tobacco. He reports that he uses illicit drugs. He reports that he does not drink alcohol.  Additional Social History:  Alcohol / Drug Use Pain Medications: N/A Prescriptions: N/A Over the Counter: N/A History of alcohol / drug use?: Yes Substance #1 Name of Substance 1: PCP 1 - Age of First Use: Unknown - report from prior medical records 1 - Amount (size/oz): Unknown 1 - Frequency: Unknown 1 - Duration: Unknown 1 - Last Use / Amount: over year ago - per pt from prior medical record Substance #  2 Name of Substance 2: Sniffing glue/huffing pain 2 - Age of First Use: Unknown - report from prior medical records 2 - Amount (size/oz): Unknown 2 - Frequency: Unknown 2 - Duration: Unknown 2 - Last Use / Amount: around 09/20/11 Allergies: No Known Allergies  Home Medications:  Medications Prior to Admission  Medication Dose Route Frequency Provider Last Rate Last Dose  . acetaminophen (TYLENOL) tablet 650 mg  650 mg Oral Q4H PRN Jenness Corner, PA      . benztropine (COGENTIN) tablet 1 mg  1 mg Oral BID Lyanne Co, MD   1 mg at  10/03/11 1610  . diphenhydrAMINE (BENADRYL) injection 50 mg  50 mg Intramuscular Once Sunnie Nielsen, MD   50 mg at 09/29/11 0157  . diphenhydrAMINE (BENADRYL) injection 50 mg  50 mg Intravenous Once Joya Gaskins, MD   50 mg at 09/30/11 0612  . diphenhydrAMINE (BENADRYL) injection 50 mg  50 mg Intramuscular Once Cyndra Numbers, MD   50 mg at 09/30/11 2250  . diphenhydrAMINE (BENADRYL) injection 50 mg  50 mg Intramuscular Once Shelda Jakes, MD   50 mg at 10/01/11 1022  . divalproex (DEPAKOTE) DR tablet 500 mg  500 mg Oral Q12H Gerhard Munch, MD   500 mg at 10/03/11 9604  . haloperidol lactate (HALDOL) injection 5 mg  5 mg Intramuscular Once Sunnie Nielsen, MD   5 mg at 09/29/11 5409  . haloperidol lactate (HALDOL) injection 5 mg  5 mg Intramuscular Q6H PRN Loren Racer, MD   5 mg at 10/03/11 8119  . haloperidol lactate (HALDOL) injection 5 mg  5 mg Intramuscular Once Loren Racer, MD   5 mg at 09/29/11 2256  . haloperidol lactate (HALDOL) injection 5 mg  5 mg Intramuscular Once Joya Gaskins, MD   5 mg at 09/30/11 0606  . haloperidol lactate (HALDOL) injection 5 mg  5 mg Intramuscular Once Cyndra Numbers, MD   5 mg at 09/30/11 2251  . ibuprofen (ADVIL,MOTRIN) tablet 600 mg  600 mg Oral Q8H PRN Jenness Corner, PA   600 mg at 10/01/11 2120  . levETIRAcetam (KEPPRA) tablet 500 mg  500 mg Oral Once Arman Filter, NP   500 mg at 09/27/11 2246  . levETIRAcetam (KEPPRA) tablet 500 mg  500 mg Oral BID Lenon Oms Hunt, PA   500 mg at 10/03/11 0939  . lithium carbonate capsule 150 mg  150 mg Oral Once Arman Filter, NP   150 mg at 09/27/11 2246  . lithium carbonate capsule 150 mg  150 mg Oral Once Joya Gaskins, MD   150 mg at 09/30/11 0353  . LORazepam (ATIVAN) 2 MG/ML injection           . LORazepam (ATIVAN) injection 2 mg  2 mg Intramuscular Once Sunnie Nielsen, MD   2 mg at 09/29/11 0156  . LORazepam (ATIVAN) injection 2 mg  2 mg Intramuscular Once Loren Racer, MD   2 mg at 09/29/11 2258    . LORazepam (ATIVAN) injection 2 mg  2 mg Intramuscular Once Joya Gaskins, MD   2 mg at 09/30/11 1478  . LORazepam (ATIVAN) injection 2 mg  2 mg Intramuscular Once Cyndra Numbers, MD   2 mg at 09/30/11 2250  . LORazepam (ATIVAN) tablet 2 mg  2 mg Oral Q6H PRN Loren Racer, MD   2 mg at 10/03/11 0758  . OLANZapine (ZYPREXA) tablet 10 mg  10 mg Oral Daily Lyanne Co,  MD   10 mg at 10/03/11 0939  . OLANZapine (ZYPREXA) tablet 5 mg  5 mg Oral Once Cyndra Numbers, MD   5 mg at 09/30/11 2015  . ondansetron (ZOFRAN) tablet 4 mg  4 mg Oral Q8H PRN Jenness Corner, PA   4 mg at 09/28/11 2343  . ziprasidone (GEODON) injection 20 mg  20 mg Intramuscular Once [employer], PA   20 mg at 09/28/11 1946  . ziprasidone (GEODON) injection 20 mg  20 mg Intramuscular Once Loren Racer, MD   20 mg at 09/29/11 2102  . ziprasidone (GEODON) injection 20 mg  20 mg Intramuscular Once Shelda Jakes, MD   20 mg at 10/01/11 1202  . zolpidem (AMBIEN) tablet 5 mg  5 mg Oral QHS PRN Jenness Corner, PA   5 mg at 10/01/11 2120  . DISCONTD: benztropine (COGENTIN) tablet 1 mg  1 mg Oral BID Gerhard Munch, MD   1 mg at 09/29/11 1108  . DISCONTD: benztropine (COGENTIN) tablet 1 mg  1 mg Oral TID Gerhard Munch, MD      . DISCONTD: benztropine (COGENTIN) tablet 1 mg  1 mg Oral TID Lyanne Co, MD      . DISCONTD: benztropine mesylate (COGENTIN) injection 1 mg  1 mg Intramuscular BID Sunnie Nielsen, MD      . DISCONTD: benztropine mesylate (COGENTIN) injection 1 mg  1 mg Intramuscular TID Lyanne Co, MD      . DISCONTD: benztropine mesylate (COGENTIN) injection 2 mg  2 mg Intramuscular BID PRN Loren Racer, MD      . DISCONTD: haloperidol (HALDOL) tablet 5 mg  5 mg Oral QHS Jenness Corner, PA   5 mg at 09/29/11 2125  . DISCONTD: haloperidol (HALDOL) tablet 5 mg  5 mg Oral BID Sunnie Nielsen, MD   5 mg at 09/29/11 2117  . DISCONTD: haloperidol lactate (HALDOL) injection 5 mg  5 mg Intravenous Q6H PRN Loren Racer, MD      . DISCONTD: LORazepam (ATIVAN) tablet 1 mg  1 mg Oral Q8H PRN Jenness Corner, PA   1 mg at 09/29/11 1320  . DISCONTD: sertraline (ZOLOFT) tablet 100 mg  100 mg Oral Daily Jenness Corner, PA   100 mg at 09/29/11 1108   Medications Prior to Admission  Medication Sig Dispense Refill  . aspirin EC 81 MG tablet Take 81 mg by mouth daily.      . cholecalciferol (VITAMIN D) 1000 UNITS tablet Take 1,000 Units by mouth daily.      . clonazePAM (KLONOPIN) 1 MG tablet Take 1 mg by mouth at bedtime.      Marland Kitchen doxycycline (VIBRA-TABS) 100 MG tablet Take 1 tablet (100 mg total) by mouth 2 (two) times daily.  28 tablet  0  . haloperidol (HALDOL) 5 MG tablet Take 5 mg by mouth at bedtime.      . levETIRAcetam (KEPPRA) 500 MG tablet Take 500 mg by mouth 2 (two) times daily.      Marland Kitchen LITHIUM CARBONATE PO Take 200 mg by mouth 2 (two) times daily. 1 tablet in the morning and 2 at bedtime      . meloxicam (MOBIC) 15 MG tablet Take 1 tablet (15 mg total) by mouth daily.  15 tablet  0  . Multiple Vitamins-Minerals (MULTIVITAMINS THER. W/MINERALS) TABS Take 1 tablet by mouth daily.      . sertraline (ZOLOFT) 100 MG tablet Take 100 mg by mouth daily.  OB/GYN Status:  No LMP for male patient.  General Assessment Data Location of Assessment: [facility] ED ACT Assessment: Yes Living Arrangements: Group Home Can pt return to current living arrangement?: Yes Admission Status: Involuntary Is patient capable of signing voluntary admission?: No Transfer from: Group Home Referral Source: Other ([employer])  Education Status Is patient currently in school?: No  Risk to self Suicidal Ideation: No Suicidal Intent: No Is patient at risk for suicide?: No Suicidal Plan?: No Access to Means: No What has been your use of drugs/alcohol within the last 12 months?: sniffing glue, huffing paint - as recent as 09/20/11 per medical records Previous Attempts/Gestures: No How many times?: 0  Other Self Harm Risks: pt  denies Triggers for Past Attempts: Unknown Intentional Self Injurious Behavior: None Family Suicide History: Unable to assess Recent stressful life event(s):  (Non-compliant w/ Rx; huffing paint) Persecutory voices/beliefs?: No Depression: Yes Depression Symptoms: Feeling angry/irritable Substance abuse history and/or treatment for substance abuse?: No Suicide prevention information given to non-admitted patients: Not applicable  Risk to Others Homicidal Ideation: No Thoughts of Harm to Others: No Current Homicidal Intent: No Current Homicidal Plan: No Access to Homicidal Means: No Identified Victim: n/a History of harm to others?: No Assessment of Violence: None Noted Violent Behavior Description: Unknown Does patient have access to weapons?: No Criminal Charges Pending?: No Does patient have a court date: No  Psychosis Hallucinations: Auditory (hears voices - responding to internal stimuli) Delusions: Unspecified (Religious preoccupation)  Mental Status Report Appear/Hygiene: Disheveled Eye Contact: Poor Motor Activity: Hyperactivity;Gait exaggerated Speech: Incoherent Level of Consciousness: Alert Mood: Empty (Not responding to many questions, blankly stares) Affect: Blunted Anxiety Level: Minimal Thought Processes: Irrelevant;Tangential Judgement: Impaired Orientation: Person Obsessive Compulsive Thoughts/Behaviors: Minimal (Randomly screams out "God")  Cognitive Functioning Concentration: Decreased Memory: Recent Impaired;Remote Impaired IQ: Average Insight: Poor Impulse Control: Poor Appetite: Poor Weight Loss: 0  Weight Gain: 0  Sleep:  (Unable to assess) Total Hours of Sleep: 10  Vegetative Symptoms: Decreased grooming  Prior Inpatient Therapy Prior Inpatient Therapy: Yes Prior Therapy Dates: Pt unable to remember Prior Therapy Facilty/Provider(s):  (Unknown) Reason for Treatment: schizophrenia  Prior Outpatient Therapy Prior Outpatient Therapy:   (Unknown)  ADL Screening (condition at time of admission) Patient's cognitive ability adequate to safely complete daily activities?: No Patient able to express need for assistance with ADLs?: No Independently performs ADLs?: No Communication: Needs assistance Is this a change from baseline?: Change from baseline, expected to last <3 days Dressing (OT): Needs assistance Is this a change from baseline?: Change from baseline, expected to last <3days Grooming: Needs assistance Is this a change from baseline?: Change from baseline, expected to last <3 days Feeding: Needs assistance Is this a change from baseline?: Change from baseline, expected to last <3 days Bathing: Needs assistance Is this a change from baseline?: Change from baseline, expected to last <3 days Toileting: Needs assistance Is this a change from baseline?: Change from baseline, expected to last <3 days In/Out Bed: Needs assistance Is this a change from baseline?: Change from baseline, expected to last <3 days Walks in Home: Needs assistance Is this a change from baseline?: Change from baseline, expected to last <3 days Weakness of Legs: None Weakness of Arms/Hands: None  Home Assistive Devices/Equipment Home Assistive Devices/Equipment: None  Therapy Consults (therapy consults require a physician order) PT Evaluation Needed: No OT Evalulation Needed: No SLP Evaluation Needed: No Abuse/Neglect Assessment (Assessment to be complete while patient is alone) Physical Abuse: Denies Verbal Abuse: Denies  Sexual Abuse: Denies Exploitation of patient/patient's resources: Denies Self-Neglect: Denies Values / Beliefs Cultural Requests During Hospitalization: None Spiritual Requests During Hospitalization: None Consults Spiritual Care Consult Needed: No Social Work Consult Needed: Yes (Comment) Advance Directives (For Healthcare) Advance Directive: Patient does not have advance directive;Patient would not like  information Pre-existing out of facility DNR order (yellow form or pink MOST form): No Nutrition Screen Diet: Regular Unintentional weight loss greater than 10lbs within the last month: No Dysphagia: No Home Tube Feeding or Total Parenteral Nutrition (TPN): No Patient appears severely malnourished: No Pregnant or Lactating: No Dietitian Consult Needed: No  Additional Information 1:1 In Past 12 Months?: No CIRT Risk: No Elopement Risk: No Does patient have medical clearance?: Yes     Disposition:  Disposition Disposition of Patient: Inpatient treatment program Type of inpatient treatment program: Adult Patient referred to: [facility] (Pending [facility])  On Site Evaluation by:   Reviewed with Physician:     Charyl Bigger D 10/03/2011 10:07 AM

## 2011-10-03 NOTE — ED Provider Notes (Signed)
Telepsych consult is appreciated. Recommendations were to discontinue Haldol, Zyprexa, Cogentin, Ambien and Depakote. Recommendations were to start Geodon, doxycycline, and Klonopin. This will be done.  Dione Booze, MD 10/03/11 2026

## 2011-10-03 NOTE — ED Notes (Signed)
TC from [place] @ [facility]. Stated they were quite familiar with this pt. Has been at their facility multiple times. Most recent stay was from April-Aug 2019. She will fax over the medications pt was on and was d/c with to help [place] out in getting him on correct meds while he is on the wait list. She also stated they have a substantial wait list. Stated pt does have a guardian and was last know to live at [facility].

## 2011-10-03 NOTE — ED Provider Notes (Addendum)
Pt alert, content, no distress. Pt on [facility] list for placement there.   Suzi Roots, MD 10/03/11 [phone]   Pt has had telepsych re-eval. They recommend d/c haldol, d/c zyprexa, d/c cogentin, d/c ambien, and d/c depakote, start geodon 80 mg bid, and klonopin 1 mg qhs.  Of note, pt d/c'd from hospital 1/22 w thrombophlebitis, ?cellulitis at prior iv site. Currently pt has no erythema or fluctuance/abscess to RUE, no current evidence cellulitis or thrombophlebitis, no fevers x several days, last cbc normal  - as such, appears to have resolved. Will repeat cmet and Lithium level. Signed out to Dr Preston Fleeting to recheck pt, follow up on Lith level and cmet when back, and follow up with ACT team re planned [facility] placement.   Suzi Roots, MD 10/03/11 1616   Pt awake, resting quietly in bed. Denies any c/o. Pt scheduled to go to [facility] this morning.   Suzi Roots, MD 10/06/11 [phone]

## 2011-10-04 ENCOUNTER — Emergency Department (HOSPITAL_COMMUNITY): Payer: Medicare Other

## 2011-10-04 LAB — COMPREHENSIVE METABOLIC PANEL
ALT: 59 U/L — ABNORMAL HIGH (ref 0–53)
Alkaline Phosphatase: 73 U/L (ref 39–117)
BUN: 11 mg/dL (ref 6–23)
CO2: 26 mEq/L (ref 19–32)
Calcium: 9.5 mg/dL (ref 8.4–10.5)
GFR calc Af Amer: >90 mL/min (ref >90)
GFR calc non Af Amer: >90 mL/min (ref >90)
Glucose, Bld: 103 mg/dL — ABNORMAL HIGH (ref 70–99)
Potassium: 3.4 mEq/L — ABNORMAL LOW (ref 3.5–5.1)
Sodium: 142 mEq/L (ref 135–145)

## 2011-10-04 LAB — DIFFERENTIAL
Eosinophils Relative: 2 % (ref 0–5)
Lymphocytes Relative: 24 % (ref 12–46)
Lymphs Abs: 1.3 10*3/uL (ref 0.7–4.0)
Monocytes Relative: 8 % (ref 3–12)

## 2011-10-04 LAB — CBC
HCT: 41.4 % (ref 39.0–52.0)
MCH: 31.4 pg (ref 26.0–34.0)
Platelets: 232 10*3/uL (ref 150–400)

## 2011-10-04 LAB — URINALYSIS, ROUTINE W REFLEX MICROSCOPIC
Bilirubin Urine: NEGATIVE
Nitrite: NEGATIVE
Protein, ur: NEGATIVE mg/dL
Urobilinogen, UA: 1 mg/dL (ref 0.0–1.0)

## 2011-10-04 LAB — RAPID URINE DRUG SCREEN, HOSP PERFORMED
Barbiturates: NOT DETECTED
Tetrahydrocannabinol: NOT DETECTED

## 2011-10-04 NOTE — ED Notes (Signed)
Pt is awake and unable to say words clearly.

## 2011-10-04 NOTE — ED Notes (Signed)
Back from CT

## 2011-10-04 NOTE — ED Provider Notes (Addendum)
Patient received 5 of Haldol and 2 of Ativan overnight. Patient without complaints this morning. Is on [facility]  Dayton Bailiff, MD 10/04/11 [phone]  Patient began urinating and defecating on himself later this morning. Has became a total care for nursing. Laboratory studies and CT head were obtained. On review the patient's medications I found that he was given 80 of Geodon twice daily. This is likely oversedation.  Dayton Bailiff, MD 10/04/11 [phone]

## 2011-10-04 NOTE — ED Notes (Signed)
Pt. Unable to do ADL's  And unable to communicate, EDP transferred to TCU.

## 2011-10-04 NOTE — [facility] Counselor (Signed)
Contacted [facility] and confirmed that patient is on the [facility] wait list.

## 2011-10-04 NOTE — ED Notes (Signed)
Held Geodon per emergency physician's order.

## 2011-10-04 NOTE — ED Notes (Signed)
Pt. Refusing liquids when offered.  Sitter unaware if patient has been taking food or fluids.

## 2011-10-04 NOTE — ED Notes (Signed)
Pt taken CT scan and had just had blood drawn.

## 2011-10-05 MED ORDER — HALOPERIDOL LACTATE 5 MG/ML IJ SOLN
INTRAMUSCULAR | Status: AC
  Administered 2011-10-05: 5 mg via INTRAMUSCULAR
  Filled 2011-10-05: qty 1

## 2011-10-05 MED ORDER — HALOPERIDOL LACTATE 5 MG/ML IJ SOLN: 5.0000 mg | Freq: Four times a day (QID) | INTRAMUSCULAR | Status: DC | PRN

## 2011-10-05 NOTE — ED Provider Notes (Signed)
History     CSN: [id number]  Arrival date & time 09/28/11  1354   First MD Initiated Contact with Patient 09/28/11 1502      Chief Complaint  Patient presents with  . Medical Clearance    (Consider location/radiation/quality/duration/timing/severity/associated sxs/prior treatment) HPI  Past Medical History  Diagnosis Date  . Hypertension   . Psychiatric disorder   . Hepatitis C   . Seizures   . Schizophrenia   . Bipolar affective     History reviewed. No pertinent past surgical history.  No family history on file.  History  Substance Use Topics  . Smoking status: Current Everyday Smoker -- 1.0 packs/day for 3 years    Types: Cigarettes  . Smokeless tobacco: Never Used  . Alcohol Use: No      Review of Systems  Allergies  Review of patient's allergies indicates no known allergies.  Home Medications   Current Outpatient Rx  Name Route Sig Dispense Refill  . ASPIRIN EC 81 MG PO TBEC Oral Take 81 mg by mouth daily.    Marland Kitchen VITAMIN D 1000 UNITS PO TABS Oral Take 1,000 Units by mouth daily.    Marland Kitchen CLONAZEPAM 1 MG PO TABS Oral Take 1 mg by mouth at bedtime.    Marland Kitchen DOXYCYCLINE HYCLATE 100 MG PO TABS Oral Take 1 tablet (100 mg total) by mouth 2 (two) times daily. 28 tablet 0  . HALOPERIDOL 5 MG PO TABS Oral Take 5 mg by mouth at bedtime.    Marland Kitchen LEVETIRACETAM 500 MG PO TABS Oral Take 500 mg by mouth 2 (two) times daily.    Marland Kitchen LITHIUM CARBONATE PO Oral Take 200 mg by mouth 2 (two) times daily. 1 tablet in the morning and 2 at bedtime    . MELOXICAM 15 MG PO TABS Oral Take 1 tablet (15 mg total) by mouth daily. 15 tablet 0  . THERA M PLUS PO TABS Oral Take 1 tablet by mouth daily.    . SERTRALINE HCL 100 MG PO TABS Oral Take 100 mg by mouth daily.      BP 155/107  Pulse 71  Temp(Src) 98 F (36.7 C) (Oral)  Resp 16  Ht 5\' 8"  (1.727 m)  Wt 180 lb (81.647 kg)  BMI 27.37 kg/m2  SpO2 99%  Physical Exam  ED Course  Procedures (including critical care time)  Labs  Reviewed  COMPREHENSIVE METABOLIC PANEL - Abnormal; Notable for the following:    Potassium 3.4 (*)    Glucose, Bld 121 (*)    Total Protein 8.8 (*)    AST 57 (*)    ALT 67 (*)    GFR calc non Af Amer 89 (*)    All other components within normal limits  COMPREHENSIVE METABOLIC PANEL - Abnormal; Notable for the following:    Albumin 3.4 (*)    AST 101 (*)    ALT 69 (*)    All other components within normal limits  LITHIUM LEVEL - Abnormal; Notable for the following:    Lithium Lvl <0.25 (*)    All other components within normal limits  COMPREHENSIVE METABOLIC PANEL - Abnormal; Notable for the following:    Potassium 3.4 (*)    Glucose, Bld 103 (*)    Albumin 3.4 (*)    AST 75 (*)    ALT 59 (*)    All other components within normal limits  URINALYSIS, ROUTINE W REFLEX MICROSCOPIC - Abnormal; Notable for the following:    Ketones, ur TRACE (*)  All other components within normal limits  CBC  ETHANOL  ACETAMINOPHEN LEVEL  URINE RAPID DRUG SCREEN (HOSP PERFORMED)  CBC  DIFFERENTIAL  ETHANOL  URINE RAPID DRUG SCREEN (HOSP PERFORMED)   Ct Head Wo Contrast  10/04/2011  *RADIOLOGY REPORT*  Clinical Data: Altered mental status and confusion  CT HEAD WITHOUT CONTRAST  Technique:  Contiguous axial images were obtained from the base of the skull through the vertex without contrast.  Comparison: 09/20/2011  Findings: The brain has a normal appearance without evidence for hemorrhage, infarction, hydrocephalus, or mass lesion.  There is no extra axial fluid collection.  The skull and paranasal sinuses are normal.  IMPRESSION: Negative exam.  Original Report Authenticated By: Rosealee Albee, M.D.     1. Schizophrenia   2. Hallucinations       MDM  Duplicate note        Doug Sou, MD 10/05/11 [phone]

## 2011-10-05 NOTE — ED Notes (Signed)
Dr. Bebe Shaggy gave the verbal order for pt to continue to have bilateral soft wrist and ankle restraints at 1500 to prevent pt from being a danger to himself and to others.

## 2011-10-05 NOTE — ED Notes (Signed)
Patient starting to settle down without screaming bang, bang, ever 5 minutes.  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

## 2011-10-05 NOTE — ED Notes (Signed)
TC with Larita Fife @ [facility]. Stated they did get the fax, but the findings & custody back page was blank. Gave another set of vitals. Also needed the [facility] Auth #. Checked the file and not able to locate the [facility] auth. TC with [facility] @ [place]. Gave info about pt and needed the auth number. Jovita Gamma [id number] from 2/1-11/13. Faxed this info to [facility]. Completed updated/renewed IVC papers.

## 2011-10-05 NOTE — ED Notes (Signed)
Police came to serve pt with IVC papers; witnessed by Charity fundraiser and Child psychotherapist.

## 2011-10-05 NOTE — ED Notes (Signed)
TC with Brett Canales @ [facility]. Confirmed pt was on the wait list. Stated that they needed vitals, legals and MAR for the past 3 days on pt. Once this is reviewed, pt may be eligible for transport. Faxed info to [facility] for review. Legals need to be updated today.

## 2011-10-05 NOTE — ED Provider Notes (Signed)
Patient reportedly incontinent of stool and urine at baseline and mother is incoherent when he. All medications were stopped earlier today as patient was sedated and felt to be overmedicated at 9 PM patient is alert moderate and coherently moves all extremities. Geodon discontinued suggest continue benzodiazepines as needed for agitation.  Doug Sou, MD 10/05/11 [phone]

## 2011-10-06 MED ORDER — ZIPRASIDONE MESYLATE 20 MG IM SOLR
20.0000 mg | Freq: Once | INTRAMUSCULAR | Status: AC
  Administered 2011-10-06: 20 mg via INTRAMUSCULAR
  Filled 2011-10-06: qty 20

## 2011-10-06 NOTE — ED Notes (Signed)
Patient denies pain and is resting comfortably.  

## 2011-10-06 NOTE — ED Notes (Signed)
No vitals signs taken per Nurse let pt continue to sleep

## 2011-10-06 NOTE — ED Notes (Signed)
Dr Preston Fleeting reviewed case and ordered Geodon IM for the patient.  Patient now screaming so loud that he  Is arousing and waking up the other patients in TCU

## 2011-10-06 NOTE — ED Notes (Signed)
Pt given full meal before transport to [facility] for further treatment and evaluation.

## 2011-10-06 NOTE — ED Notes (Signed)
Pt taken by [facility] office- [place]- to [facility] for further evaluation and treatment.

## 2011-10-06 NOTE — ED Notes (Signed)
Report called to the admission nurse at [facility].  The sheriff department was notified of the transport to the patient due to the IVC papers

## 2011-10-06 NOTE — ED Notes (Signed)
Continues to sleep and await transport

## 2011-10-06 NOTE — ED Notes (Signed)
Have placement for the patient at [facility] in the am per the act team msw

## 2011-12-25 ENCOUNTER — Other Ambulatory Visit: Payer: Self-pay | Admitting: Family Medicine

## 2011-12-25 ENCOUNTER — Ambulatory Visit
Admission: RE | Admit: 2011-12-25 | Discharge: 2011-12-25 | Disposition: A | Discharge status: Home / Self Care | Payer: Medicare Other | Source: Ambulatory Visit | Attending: Family Medicine | Admitting: Family Medicine

## 2011-12-25 DIAGNOSIS — F17200 Nicotine dependence, unspecified, uncomplicated: Secondary | ICD-10-CM

## 2012-01-04 ENCOUNTER — Emergency Department (HOSPITAL_COMMUNITY): Payer: Medicare Other

## 2012-01-04 ENCOUNTER — Encounter (HOSPITAL_COMMUNITY): Payer: Self-pay

## 2012-01-04 ENCOUNTER — Emergency Department (HOSPITAL_COMMUNITY)
Admission: EM | Admit: 2012-01-04 | Discharge: 2012-01-11 | Disposition: A | Discharge status: Other Acute Inpatient Hospital | Payer: Medicare Other | Attending: Emergency Medicine | Admitting: Emergency Medicine

## 2012-01-04 DIAGNOSIS — Z7982 Long term (current) use of aspirin: Secondary | ICD-10-CM | POA: Insufficient documentation

## 2012-01-04 DIAGNOSIS — R4586 Emotional lability: Secondary | ICD-10-CM | POA: Insufficient documentation

## 2012-01-04 DIAGNOSIS — G40909 Epilepsy, unspecified, not intractable, without status epilepticus: Secondary | ICD-10-CM | POA: Insufficient documentation

## 2012-01-04 DIAGNOSIS — F29 Unspecified psychosis not due to a substance or known physiological condition: Secondary | ICD-10-CM | POA: Insufficient documentation

## 2012-01-04 DIAGNOSIS — IMO0002 Reserved for concepts with insufficient information to code with codable children: Secondary | ICD-10-CM | POA: Insufficient documentation

## 2012-01-04 DIAGNOSIS — F319 Bipolar disorder, unspecified: Secondary | ICD-10-CM | POA: Insufficient documentation

## 2012-01-04 DIAGNOSIS — Z8619 Personal history of other infectious and parasitic diseases: Secondary | ICD-10-CM | POA: Insufficient documentation

## 2012-01-04 DIAGNOSIS — Z8659 Personal history of other mental and behavioral disorders: Secondary | ICD-10-CM | POA: Insufficient documentation

## 2012-01-04 DIAGNOSIS — I10 Essential (primary) hypertension: Secondary | ICD-10-CM | POA: Insufficient documentation

## 2012-01-04 DIAGNOSIS — R4182 Altered mental status, unspecified: Secondary | ICD-10-CM | POA: Insufficient documentation

## 2012-01-04 DIAGNOSIS — Z79899 Other long term (current) drug therapy: Secondary | ICD-10-CM | POA: Insufficient documentation

## 2012-01-04 LAB — URINE MICROSCOPIC-ADD ON

## 2012-01-04 LAB — COMPREHENSIVE METABOLIC PANEL
ALT: 38 U/L (ref 0–53)
AST: 34 U/L (ref 0–37)
Albumin: 4 g/dL (ref 3.5–5.2)
CO2: 25 mEq/L (ref 19–32)
Chloride: 102 mEq/L (ref 96–112)
GFR calc non Af Amer: >90 mL/min (ref >90)
Potassium: 3.5 mEq/L (ref 3.5–5.1)
Sodium: 138 mEq/L (ref 135–145)
Total Bilirubin: 1.8 mg/dL — ABNORMAL HIGH (ref 0.3–1.2)

## 2012-01-04 LAB — RAPID URINE DRUG SCREEN, HOSP PERFORMED
Amphetamines: NOT DETECTED
Barbiturates: NOT DETECTED
Cocaine: NOT DETECTED
Tetrahydrocannabinol: NOT DETECTED

## 2012-01-04 LAB — URINALYSIS, ROUTINE W REFLEX MICROSCOPIC
Bilirubin Urine: NEGATIVE
Nitrite: NEGATIVE
Specific Gravity, Urine: 1.009 (ref 1.005–1.030)
Urobilinogen, UA: 1 mg/dL (ref 0.0–1.0)
pH: 7.5 (ref 5.0–8.0)

## 2012-01-04 LAB — CBC
Platelets: 175 10*3/uL (ref 150–400)
RBC: 4.55 MIL/uL (ref 4.22–5.81)
WBC: 9.1 10*3/uL (ref 4.0–10.5)

## 2012-01-04 MED ORDER — ACETAMINOPHEN 325 MG PO TABS: 650.0000 mg | ORAL_TABLET | ORAL | Status: DC | PRN

## 2012-01-04 MED ORDER — LORAZEPAM 1 MG PO TABS: 1.0000 mg | ORAL_TABLET | Freq: Three times a day (TID) | ORAL | Status: DC | PRN

## 2012-01-04 MED ORDER — ALUM & MAG HYDROXIDE-SIMETH 200-200-20 MG/5ML PO SUSP
30.0000 mL | ORAL | Status: DC | PRN
  Administered 2012-01-06: 30 mL via ORAL
  Filled 2012-01-04: qty 30

## 2012-01-04 MED ORDER — ONDANSETRON HCL 8 MG PO TABS: 4.0000 mg | ORAL_TABLET | Freq: Three times a day (TID) | ORAL | Status: DC | PRN

## 2012-01-04 MED ORDER — NICOTINE 21 MG/24HR TD PT24
21.0000 mg | MEDICATED_PATCH | Freq: Every day | TRANSDERMAL | Status: DC
  Administered 2012-01-05 – 2012-01-11 (×6): 21 mg via TRANSDERMAL
  Filled 2012-01-04 (×6): qty 1

## 2012-01-04 MED ORDER — IBUPROFEN 200 MG PO TABS: 600.0000 mg | ORAL_TABLET | Freq: Three times a day (TID) | ORAL | Status: DC | PRN

## 2012-01-04 NOTE — ED Notes (Signed)
Pt. Is brought by a caregiver from [facility] for a psych eval.   She reports that pt. [facility]ilty on April 9th and he is confused. , periods of anger.   Pt. Wandered off and ended up the had topolice help find the pt.   Pt. Is alert to his name .  Pt. Has been at this facility for over 1 year and they have noticed a change since coming back, that  He is not stable.

## 2012-01-04 NOTE — ED Notes (Addendum)
Spoke with Leotis Shames, patients caregiver at group home. States patient is a ward of the state due to mental illness and TBI in 2010, reports patient has generally been more confused than normal. Recently has wandered away from group home not telling staff where he was going. Also patient has outbursts of rage with staff. Pt alert and oriented to person, place, time and situation for this RN. Denies suicidal/homicidal ideation or hearing voices. Pt cooperative with care at this time, lying on bed watching TV. Sitter at bedside. Patient placed in paper scrubs. Belongings placed at nursing station. [name] requests a member of this staff please contact staff at group home to notify them of patients disposition status, once available. Report given to Thayer Ohm, RN at shift change and aware that belongings still need inventoried.

## 2012-01-04 NOTE — ED Provider Notes (Signed)
History     CSN: [id number]  Arrival date & time 01/04/12  1513   First MD Initiated Contact with Patient 01/04/12 1755      Chief Complaint  Patient presents with  . Psychiatric Evaluation    (Consider location/radiation/quality/duration/timing/severity/associated sxs/prior treatment) The history is provided by the patient and a caregiver. The history is limited by the condition of the patient.   Patient here from a group home with altered mental status consisting of increased agitation that has been persistent for close to a month and worse last 3 days. No recent head trauma. He has been taking his anti-psychotic medication appropriately. He does have a history of schizophrenia. Patient wondered off the group home and had to be found by police. He had a recent [facility] and according to the staff from the group home he has not been his normal self since. Patient himself denies fever severe headache. He has a very difficult historian Past Medical History  Diagnosis Date  . Hypertension   . Psychiatric disorder   . Hepatitis C   . Seizures   . Schizophrenia   . Bipolar affective     History reviewed. No pertinent past surgical history.  No family history on file.  History  Substance Use Topics  . Smoking status: Current Everyday Smoker -- 1.0 packs/day for 3 years    Types: Cigarettes  . Smokeless tobacco: Never Used  . Alcohol Use: No      Review of Systems  Unable to perform ROS   Allergies  Review of patient's allergies indicates no known allergies.  Home Medications   Current Outpatient Rx  Name Route Sig Dispense Refill  . ASPIRIN EC 81 MG PO TBEC Oral Take 81 mg by mouth daily.    Marland Kitchen VITAMIN D 1000 UNITS PO TABS Oral Take 1,000 Units by mouth daily.    Marland Kitchen CLONAZEPAM 1 MG PO TABS Oral Take 1 mg by mouth at bedtime.    Marland Kitchen DOXYCYCLINE HYCLATE 100 MG PO CPEP Oral Take 100 mg by mouth 2 (two) times daily.    Marland Kitchen HALOPERIDOL 5 MG  PO TABS Oral Take 5 mg by mouth at bedtime.    Marland Kitchen LEVETIRACETAM 500 MG PO TABS Oral Take 500 mg by mouth 2 (two) times daily.    Marland Kitchen LITHIUM CARBONATE PO Oral Take 200 mg by mouth 2 (two) times daily. 1 tablet in the morning and 2 at bedtime    . MELOXICAM 15 MG PO TABS Oral Take 1 tablet (15 mg total) by mouth daily. 15 tablet 0  . THERA M PLUS PO TABS Oral Take 1 tablet by mouth daily.    Marland Kitchen POLYETHYLENE GLYCOL 3350 PO PACK Oral Take 17 g by mouth daily.    . SERTRALINE HCL 100 MG PO TABS Oral Take 100 mg by mouth daily.    . THIAMINE HCL 100 MG PO TABS Oral Take 100 mg by mouth daily.      BP 117/68  Pulse 95  Resp 18  SpO2 98%  Physical Exam  Nursing note and vitals reviewed. Constitutional: He is oriented to person, place, and time. He appears well-developed and well-nourished.  Non-toxic appearance. No distress.  HENT:  Head: Normocephalic and atraumatic.  Eyes: Conjunctivae, EOM and lids are normal. Pupils are equal, round, and reactive to light.  Neck: Normal range of motion. Neck supple. No tracheal deviation present. No mass present.  Cardiovascular: Normal rate, regular rhythm and normal heart sounds.  Exam reveals no gallop.   No murmur heard. Pulmonary/Chest: Effort normal and breath sounds normal. No stridor. No respiratory distress. He has no decreased breath sounds. He has no wheezes. He has no rhonchi. He has no rales.  Abdominal: Soft. Normal appearance and bowel sounds are normal. He exhibits no distension. There is no tenderness. There is no rebound and no CVA tenderness.  Musculoskeletal: Normal range of motion. He exhibits no edema and no tenderness.  Neurological: He is alert and oriented to person, place, and time. He has normal strength. No cranial nerve deficit or sensory deficit. GCS eye subscore is 4. GCS verbal subscore is 5. GCS motor subscore is 6.  Skin: Skin is warm and dry. No abrasion and no rash noted.  Psychiatric: His affect is labile. His speech is  delayed. He is slowed. He expresses no suicidal plans and no homicidal plans.    ED Course  Procedures (including critical care time)  Labs Reviewed  COMPREHENSIVE METABOLIC PANEL - Abnormal; Notable for the following:    Glucose, Bld 113 (*)    Total Bilirubin 1.8 (*)    All other components within normal limits  CBC  ETHANOL  URINE RAPID DRUG SCREEN (HOSP PERFORMED)   No results found.   No diagnosis found.    MDM  Pt seen by act and will need placement--suspect medication reaction as cause of sx        Toy Baker, MD 01/04/12 2319

## 2012-01-04 NOTE — BH Assessment (Addendum)
Assessment Note Declined at [facility] per [facility] due to acuity.  Pt was also declined at [facility] by the [facility] [name], due to acuity. Referral faxed to [facility] for review.  If patient is declined he will need to be referred to [facility].  Daryl Campbell is an 61 y.o. male brought in by his rest home after continual decompensation after recent release from [facility].  The patient was hospitalized from [place] and came home with some improvement, but over the last week continues to decompensate.  Upon assessment, Mr Brayboy was barely able to answer questions, was disoriented to his location, but denied SI or HI, but did admit to hearing voices and seeing things other people can't see, but couldn't specify.  His speech was difficult to understand and he was difficult to arouse.  [name], the representative from [facility], states that his gait has been unsteady and his mood labile.  She reports that he ocasionally voices seeing things that other people can'ts ee and responding to internal stimuli.  She also reports that he is not oriented to time, last night he tried to go to the neighboring group home in the middle of the night, thinking it was early evening.  He also wondered away from his day treatment program and was [facility].  Dr Freida Busman, EDP, believes the patient's condition is connected to his medications.  Mr Schauer information is being submitted to [facility] for review.  Axis I: Chronic Paranoid Schizophrenia Axis II: Deferred Axis III:  Past Medical History  Diagnosis Date  . Hypertension   . Psychiatric disorder   . Hepatitis C   . Seizures   . Schizophrenia   . Bipolar affective    Axis IV: other psychosocial or environmental problems Axis V: 11-20 some danger of hurting self or others possible OR occasionally fails to maintain minimal personal hygiene OR gross impairment in communication  Past Medical History:  Past Medical History  Diagnosis Date  .  Hypertension   . Psychiatric disorder   . Hepatitis C   . Seizures   . Schizophrenia   . Bipolar affective     History reviewed. No pertinent past surgical history.  Family History: No family history on file.  Social History:  reports that he has been smoking Cigarettes.  He has a 3 pack-year smoking history. He has never used smokeless tobacco. He reports that he uses illicit drugs. He reports that he does not drink alcohol.  Additional Social History:  Alcohol / Drug Use History of alcohol / drug use?: No history of alcohol / drug abuse Allergies: No Known Allergies  Home Medications:  (Not in a hospital admission)  OB/GYN Status:  No LMP for male patient.  General Assessment Data Location of Assessment: [facility] ED Living Arrangements: Other (Comment) ([facility]) Can pt return to current living arrangement?: Yes Admission Status: Voluntary Is patient capable of signing voluntary admission?: No Transfer from: Acute Hospital Referral Source: Other (group home)  Education Status Is patient currently in school?: No  Risk to self Suicidal Ideation: No Suicidal Intent: No Is patient at risk for suicide?: No Suicidal Plan?: No Access to Means: No Previous Attempts/Gestures: No Intentional Self Injurious Behavior: None Recent stressful life event(s): Loss (Comment) (parents died 10 years ago, still grieving, recently hospital) Persecutory voices/beliefs?: No Depression: Yes Depression Symptoms: Despondent;Insomnia;Loss of interest in usual pleasures;Feeling worthless/self pity;Feeling angry/irritable Substance abuse history and/or treatment for substance abuse?: Yes (likes to sniff  glue) Suicide prevention information given to non-admitted patients: Not applicable  Risk to Others Homicidal Ideation: No Thoughts of Harm to Others: No Current Homicidal Intent: No Current Homicidal Plan: No Access to Homicidal Means: No History of harm to others?: No Assessment of  Violence: None Noted Does patient have access to weapons?: No Criminal Charges Pending?: No Does patient have a court date: No  Psychosis Hallucinations: Auditory;Visual Delusions: None noted  Mental Status Report Appear/Hygiene: Disheveled Eye Contact: Poor (blanket over head, eyes closed) Motor Activity: Psychomotor retardation Speech: Soft;Slurred Level of Consciousness: Quiet/awake Mood: Depressed;Anxious;Angry;Labile Affect: Unable to Assess Anxiety Level: Minimal Thought Processes:  (unable to assess) Judgement: Impaired Orientation: Not oriented  Cognitive Functioning Concentration: Decreased Memory: Recent Impaired;Remote Impaired Insight: Poor Impulse Control: Poor Appetite: Fair Sleep: Decreased Total Hours of Sleep:  (unk) Vegetative Symptoms: Decreased grooming  Prior Inpatient Therapy Prior Inpatient Therapy: Yes Prior Therapy Dates: January-April 2013 Prior Therapy Facilty/Provider(s): [facility] Reason for Treatment: psychosis  Prior Outpatient Therapy Prior Outpatient Therapy: Yes Prior Therapy Dates: ongoing day treatment Prior Therapy Facilty/Provider(s): [facility]  ADL Screening (condition at time of admission) Patient's cognitive ability adequate to safely complete daily activities?: Yes Independently performs ADLs?: Yes Communication: Independent Dressing (OT): Needs assistance Is this a change from baseline?: Pre-admission baseline Grooming: Needs assistance Is this a change from baseline?: Pre-admission baseline Feeding: Needs assistance Is this a change from baseline?: Pre-admission baseline Bathing: Needs assistance Is this a change from baseline?: Pre-admission baseline Toileting: Independent In/Out Bed: Independent Walks in Home: Independent       Abuse/Neglect Assessment (Assessment to be complete while patient is alone) Physical Abuse: Denies Sexual Abuse: Denies Self-Neglect: Denies Values / Beliefs Cultural Requests  During Hospitalization: None Spiritual Requests During Hospitalization: None        Additional Information 1:1 In Past 12 Months?: No CIRT Risk: No Elopement Risk: No Does patient have medical clearance?: Yes     Disposition:  Disposition Disposition of Patient: Inpatient treatment program Type of inpatient treatment program: Adult  On Site Evaluation by:  Freida Busman Reviewed with Physician:  Nena Jordan Marlana Latus 01/04/2012 10:16 PM

## 2012-01-04 NOTE — ED Notes (Signed)
if need to contact caregiver from facility, [place] [phone]

## 2012-01-04 NOTE — ED Provider Notes (Signed)
Patient stable.  Plan is to move to [facility] for further evaluation.  Nelia Shi, MD 01/04/12 [phone]

## 2012-01-04 NOTE — ED Notes (Signed)
House coverage notified for one on one sitter.

## 2012-01-04 NOTE — ED Notes (Signed)
Security  Notified for pt wanding.

## 2012-01-05 ENCOUNTER — Encounter (HOSPITAL_COMMUNITY): Payer: Self-pay

## 2012-01-05 LAB — URINE CULTURE

## 2012-01-05 NOTE — ED Notes (Signed)
Woman came to Nurse first stating that pt was her client and requested to see pt. Per pt RN, Thayer Ohm, Pt is resting and no visitors are permitted at this time. Woman informed that I can not release information without pt consent. Woman left without releasing her name or any other information.

## 2012-01-05 NOTE — [facility] Counselor (Signed)
Co called [name] to receive an update on the referral for pt. They will call counselor back. Co called [name] and they have not yet run the clt.

## 2012-01-05 NOTE — [facility] Counselor (Signed)
[name] reported to the counselor that they are at capacity today but may be accepting patients tomorrow. Pt will not be run by the doctor until tomorrow. [name] stated they will keep current information on pt and a they will get back to the on-call staff tomorrow when pt has been run. Counselor [facility] to inquire about bed availability for pt and both facilities are at capacity. Counselor faxed information to [facility]) due to bed availability. Counselor contacted [facility] who had previously denied the pt today due to needing longer-term care.

## 2012-01-05 NOTE — ED Provider Notes (Signed)
9:25 AM Patient is asleep.  No reports of acute events overnight.  VS stable.  Awaiting placement.  Gerhard Munch, MD 01/05/12 [phone]

## 2012-01-06 ENCOUNTER — Emergency Department (HOSPITAL_COMMUNITY): Payer: Medicare Other

## 2012-01-06 MED ORDER — DOXYCYCLINE HYCLATE 100 MG PO TABS
100.0000 mg | ORAL_TABLET | Freq: Two times a day (BID) | ORAL | Status: DC
  Administered 2012-01-06 – 2012-01-11 (×11): 100 mg via ORAL
  Filled 2012-01-06 (×12): qty 1

## 2012-01-06 MED ORDER — CLONAZEPAM 0.5 MG PO TABS
1.0000 mg | ORAL_TABLET | Freq: Every day | ORAL | Status: DC
  Administered 2012-01-06 – 2012-01-10 (×5): 1 mg via ORAL
  Filled 2012-01-06 (×3): qty 1
  Filled 2012-01-06: qty 2
  Filled 2012-01-06: qty 1
  Filled 2012-01-06 (×2): qty 2

## 2012-01-06 MED ORDER — DOXYCYCLINE HYCLATE 100 MG PO CPEP: 100.0000 mg | ORAL_CAPSULE | Freq: Two times a day (BID) | ORAL | Status: DC

## 2012-01-06 MED ORDER — ASPIRIN EC 81 MG PO TBEC
81.0000 mg | DELAYED_RELEASE_TABLET | Freq: Every day | ORAL | Status: DC
  Administered 2012-01-06 – 2012-01-11 (×6): 81 mg via ORAL
  Filled 2012-01-06 (×6): qty 1

## 2012-01-06 MED ORDER — LITHIUM CARBONATE 150 MG PO CAPS
150.0000 mg | ORAL_CAPSULE | Freq: Two times a day (BID) | ORAL | Status: DC
  Administered 2012-01-06 – 2012-01-11 (×11): 150 mg via ORAL
  Filled 2012-01-06 (×12): qty 1

## 2012-01-06 MED ORDER — LEVETIRACETAM 500 MG PO TABS
500.0000 mg | ORAL_TABLET | Freq: Two times a day (BID) | ORAL | Status: DC
  Administered 2012-01-06 – 2012-01-11 (×11): 500 mg via ORAL
  Filled 2012-01-06 (×12): qty 1

## 2012-01-06 MED ORDER — SERTRALINE HCL 100 MG PO TABS
100.0000 mg | ORAL_TABLET | Freq: Every day | ORAL | Status: DC
  Administered 2012-01-06 – 2012-01-11 (×6): 100 mg via ORAL
  Filled 2012-01-06 (×7): qty 1

## 2012-01-06 MED ORDER — THIAMINE HCL 100 MG PO TABS
100.0000 mg | ORAL_TABLET | Freq: Every day | ORAL | Status: DC
Start: 2012-01-06 — End: 2012-01-06

## 2012-01-06 MED ORDER — HALOPERIDOL 5 MG PO TABS
5.0000 mg | ORAL_TABLET | Freq: Every day | ORAL | Status: DC
  Administered 2012-01-06 – 2012-01-10 (×5): 5 mg via ORAL
  Filled 2012-01-06 (×5): qty 1

## 2012-01-06 MED ORDER — VITAMIN B-1 100 MG PO TABS
100.0000 mg | ORAL_TABLET | Freq: Every day | ORAL | Status: DC
  Administered 2012-01-06 – 2012-01-11 (×6): 100 mg via ORAL
  Filled 2012-01-06 (×7): qty 1

## 2012-01-06 NOTE — ED Notes (Signed)
Pt eating breakfast. In a pleasant mood, smiling, laughing, answered questions, told an appropriately funny joke. Pt disoriented to date, year and time. Knows he is at the hospital but can not recall the events that led up to him being brought to ED. Sitter at bed side. Waiting for ACT team.

## 2012-01-06 NOTE — ED Notes (Signed)
Pt ambulates to restroom in NAD. Shift report received from [facility] rn.

## 2012-01-06 NOTE — ED Notes (Signed)
Pt brought to room 28. Pt belongings in locker 2. Sitter at bedside.

## 2012-01-06 NOTE — ED Notes (Signed)
Pt meal tray was ordered  

## 2012-01-06 NOTE — ED Provider Notes (Addendum)
I have added on EKG and CXR as patient may be placed at [facility] Labs/vitals reviewed, he is awake/alert, eating breakfast and has no complaints currently  His meds were updated and ordered BP 136/97  Pulse 70  Temp(Src) 97.4 F (36.3 C) (Oral)  Resp 18  SpO2 98% Awaiting placement at this time  Joya Gaskins, MD 01/06/12 0748  9:11 AM Spoke to ACT Patient has been declined at every hospital in [place] except [facility]     Date: 01/06/2012  Rate: 68  Rhythm: normal sinus rhythm  QRS Axis: normal  Intervals: normal  ST/T Wave abnormalities: inverted t waves  Conduction Disutrbances:none  Narrative Interpretation:   Old EKG Reviewed: changes noted    Joya Gaskins, MD 01/06/12 [phone]

## 2012-01-07 MED ORDER — LITHIUM CARBONATE 150 MG PO CAPS
150.0000 mg | ORAL_CAPSULE | ORAL | Status: AC
  Filled 2012-01-07: qty 1

## 2012-01-07 NOTE — ED Notes (Signed)
Fresh sheets placed on bed

## 2012-01-07 NOTE — BH Assessment (Signed)
Assessment Note   Daryl Campbell is an 61 y.o. male that was reassessed this day.  Pt reported he was feeling "a little better," and was trying to sleep.  Pt referred by group home due to decompensation after being discharged from [facility] in April.  Pt denies SI/HI currently and does admit to hearing voices and seeing things not there.  However, pt could not be specific regarding these.  Per last note, "[name], the representative from [facility], states that his gait has been unsteady and his mood labile. She reports that he ocasionally voices seeing things that other people can'ts ee and responding to internal stimuli. She also reports that he is not oriented to time, last night he tried to go to the neighboring group home in the middle of the night, thinking it was early evening. He also wondered away from his day treatment program and was [facility] a local [facility]."  Pt denies SA.  Pt was cooperative with interview today. Called [facility], spoke with [name], who stated pt's referral is going to be ran by their doctor and she is to call writer back.  She requested to know who had healthcare POA and writer called his DSS caseworker, who reported she is guardian.  Informed [name] and [name] to send over consent to [facility] that the DSS case manager is to sign if pt accepted there. Completed reassessment, assessment notification and faxed to [facility] to log.  Updated ED staff.  Previous Notes:  [name] reported to the counselor that they are at capacity today but may be accepting patients tomorrow. Pt will not be run by the doctor until tomorrow. [name] stated they will keep current information on pt and a they will get back to the on-call staff tomorrow when pt has been run. Counselor called [facility] and [facility] to inquire about bed availability for pt and both facilities are at capacity. Counselor faxed information to [facility]) due to bed availability.  Counselor contacted [facility] who had previously denied the pt today due to needing longer-term care.        Co called [facility] to receive an update on the referral for pt. They will call counselor back. Co called [name] and they have not yet run the clt.       Declined at [facility] per [facility] due to acuity. Pt was also declined at [facility] by the [name], due to acuity. Referral faxed to [facility] and [facility] for review. If patient is declined he will need to be referred to [facility].  Daryl Campbell is an 61 y.o. male brought in by his rest home after continual decompensation after recent release from [facility]. The patient was hospitalized from [place] and came home with some improvement, but over the last week continues to decompensate. Upon assessment, Daryl Campbell was barely able to answer questions, was disoriented to his location, but denied SI or HI, but did admit to hearing voices and seeing things other people can't see, but couldn't specify. His speech was difficult to understand and he was difficult to arouse. [name], the representative from [facility], states that his gait has been unsteady and his mood labile. She reports that he ocasionally voices seeing things that other people can'ts ee and responding to internal stimuli. She also reports that he is not oriented to time, last night he tried to go to the neighboring group home in the middle of the night, thinking it was early evening.  He also wondered away from his day treatment program and was [facility] a local [facility]. Dr Freida Busman, EDP, believes the patient's condition is connected to his medications. Daryl Campbell information is being submitted to [facility] for review.   Axis I: Schizophrenia, Paranoid Type (chronic) Axis II: Deferred Axis III:  Past Medical History  Diagnosis Date  . Hypertension   . Psychiatric disorder   . Hepatitis C   . Seizures   . Schizophrenia   . Bipolar affective    Axis IV: other  psychosocial or environmental problems Axis V: 11-20 some danger of hurting self or others possible OR occasionally fails to maintain minimal personal hygiene OR gross impairment in communication  Past Medical History:  Past Medical History  Diagnosis Date  . Hypertension   . Psychiatric disorder   . Hepatitis C   . Seizures   . Schizophrenia   . Bipolar affective     History reviewed. No pertinent past surgical history.  Family History: No family history on file.  Social History:  reports that he has been smoking Cigarettes.  He has a 3 pack-year smoking history. He has never used smokeless tobacco. He reports that he uses illicit drugs. He reports that he does not drink alcohol.  Additional Social History:  Alcohol / Drug Use Pain Medications: see list Prescriptions: see list Over the Counter: see list History of alcohol / drug use?: No history of alcohol / drug abuse Longest period of sobriety (when/how long): unknown Negative Consequences of Use:  (na) Withdrawal Symptoms:  (na) Allergies: No Known Allergies  Home Medications:  (Not in a hospital admission)  OB/GYN Status:  No LMP for male patient.  General Assessment Data Location of Assessment: [facility] ED Living Arrangements: Other (Comment) [facility]) Can pt return to current living arrangement?: Yes Admission Status: Voluntary Is patient capable of signing voluntary admission?: No Transfer from: Acute Hospital Referral Source: Other (group home)  Education Status Is patient currently in school?: No  Risk to self Suicidal Ideation: No Suicidal Intent: No Is patient at risk for suicide?: No Suicidal Plan?: No Access to Means: No What has been your use of drugs/alcohol within the last 12 months?: pt denies Previous Attempts/Gestures: No How many times?: 0  Other Self Harm Risks: pt denies Triggers for Past Attempts: Other (Comment) (na) Intentional Self Injurious Behavior: None Family Suicide History:  Unknown Recent stressful life event(s): Loss (Comment) (pt parents dies 10 yrs ago, still grieving, recently hospita) Persecutory voices/beliefs?: No Depression: Yes Depression Symptoms: Despondent;Insomnia;Loss of interest in usual pleasures;Feeling worthless/self pity;Feeling angry/irritable Substance abuse history and/or treatment for substance abuse?: Yes (pt likes to sniff glue) Suicide prevention information given to non-admitted patients: Not applicable  Risk to Others Homicidal Ideation: No Thoughts of Harm to Others: No Current Homicidal Intent: No Current Homicidal Plan: No Access to Homicidal Means: No Identified Victim: na History of harm to others?: No Assessment of Violence: None Noted Does patient have access to weapons?: No Criminal Charges Pending?: No Does patient have a court date: No  Psychosis Hallucinations: Auditory;Visual Delusions: None noted  Mental Status Report Appear/Hygiene: Disheveled Eye Contact: Good Motor Activity: Unremarkable Speech: Soft;Slurred Level of Consciousness: Quiet/awake Mood: Depressed Affect: Depressed Anxiety Level: Minimal Thought Processes: Coherent;Relevant Judgement: Impaired Orientation: Person;Place;Time;Situation Obsessive Compulsive Thoughts/Behaviors: None  Cognitive Functioning Concentration: Decreased Memory: Recent Intact;Remote Intact IQ: Average Insight: Poor Impulse Control: Fair Appetite: Fair Weight Loss: 0  Weight Gain: 0  Sleep: Decreased Total Hours of Sleep:  (unknown -  pt trying to sleep in ED) Vegetative Symptoms: Decreased grooming  Prior Inpatient Therapy Prior Inpatient Therapy: Yes Prior Therapy Dates: January-April 2013 Prior Therapy Facilty/Provider(s): [facility] Reason for Treatment: psychosis  Prior Outpatient Therapy Prior Outpatient Therapy: Yes Prior Therapy Dates: ongoing day treatment Prior Therapy Facilty/Provider(s): [facility] Reason for Treatment: unknown  ADL  Screening (condition at time of admission) Patient's cognitive ability adequate to safely complete daily activities?: Yes Independently performs ADLs?: Yes Communication: Independent Dressing (OT): Needs assistance Is this a change from baseline?: Pre-admission baseline Grooming: Needs assistance Is this a change from baseline?: Pre-admission baseline Feeding: Needs assistance Is this a change from baseline?: Pre-admission baseline Bathing: Needs assistance Is this a change from baseline?: Pre-admission baseline Toileting: Independent In/Out Bed: Independent Walks in Home: Independent Weakness of Legs: None Weakness of Arms/Hands: None  Home Assistive Devices/Equipment Home Assistive Devices/Equipment: None    Abuse/Neglect Assessment (Assessment to be complete while patient is alone) Physical Abuse: Denies Verbal Abuse: Denies Sexual Abuse: Denies Exploitation of patient/patient's resources: Denies Self-Neglect: Denies Values / Beliefs Cultural Requests During Hospitalization: None Spiritual Requests During Hospitalization: None Consults Spiritual Care Consult Needed: No Social Work Consult Needed: No Merchant navy officer (For Healthcare) Advance Directive: Patient does not have advance directive Pre-existing out of facility DNR order (yellow form or pink MOST form): Other (comment) (unknown)    Additional Information 1:1 In Past 12 Months?: No CIRT Risk: No Elopement Risk: No Does patient have medical clearance?: Yes     Disposition:  Disposition Disposition of Patient: Referred to;Inpatient treatment program Type of inpatient treatment program: Adult Patient referred to: Other (Comment) ([facility]ending [name])  On Site Evaluation by:   Reviewed with Physician:     Caryl Comes 01/07/2012 10:56 AM

## 2012-01-07 NOTE — ED Notes (Signed)
Offered Sitter a lunch break.  Sitter stated he was okay for now.  Stated he was leaving at 1500.  Suggested he call if needed.

## 2012-01-08 NOTE — ED Notes (Signed)
Pt ate a snack and is now sleeping.

## 2012-01-08 NOTE — BH Assessment (Addendum)
Assessment Note   Daryl Campbell is an 61 y.o. male that was reassessed this day.  Pt continues to deny SI/HI.  Pt does endorse auditory and visual hallucinations, but is not specific regarding these.  Pt denies SA.  There is no change in pt's presenting symptoms or history at this time.  See previous notes for more details.  As pt was declined at three facilities, [facility] paperwork initiated.  Completed [facility] referral form.  Called [facility] and obtained authorization from [place].  Obtained IVC paperwork and first examination from EDP [place].  Mariea Clonts at [facility] and completed phone referral as well as faxed referral.  [facility], spoke with [facility] @ [phone] who stated pt on [facility] wait list.  Called pt's DSS guardian, Murvin Donning with [place] [facility] and informed her of pt status.  Oncoming staff will need to keep her informed.  Completed reassessment, assessment notification and faxed to [facility] to log.  Updated ED staff.  Oncoming staff will need to reassess pt daily and call [facility] to ensure pt on wait list.  Previous Note: Daryl Campbell is an 61 y.o. male that was reassessed this day. Pt reported he was feeling "a little better," and was trying to sleep. Pt referred by group home due to decompensation after being discharged from [facility] in April. Pt denies SI/HI currently and does admit to hearing voices and seeing things not there. However, pt could not be specific regarding these. Per last note, "Daryl Campbell, the representative from [facility], states that his gait has been unsteady and his mood labile. She reports that he ocasionally voices seeing things that other people can'ts ee and responding to internal stimuli. She also reports that he is not oriented to time, last night he tried to go to the neighboring group home in the middle of the night, thinking it was early evening. He also wondered away from his day treatment program and was located at a local university." Pt denies SA. Pt was cooperative with interview today.  Called [facility], spoke with Daryl Campbell, who stated pt's referral is going to be ran by their doctor and she is to call writer back. She requested to know who had healthcare POA and writer called his DSS caseworker, who reported she is guardian. Informed Daryl Campbell and Daryl Campbell to send over consent to [facility] that the DSS case manager is to sign if pt accepted there. Completed reassessment, assessment notification and faxed to [facility] to log. Updated ED staff.    Axis I: Schizophrenia, Paranoid Type (Chronic) Axis II: Deferred Axis III:  Past Medical History  Diagnosis Date  . Hypertension   . Psychiatric disorder   . Hepatitis C   . Seizures   . Schizophrenia   . Bipolar affective    Axis IV: other psychosocial or environmental problems Axis V: 11-20 some danger of hurting self or others possible OR occasionally fails to maintain minimal personal hygiene OR gross impairment in communication  Past Medical History:  Past Medical History  Diagnosis Date  . Hypertension   . Psychiatric disorder   . Hepatitis C   . Seizures   . Schizophrenia   . Bipolar affective     History reviewed. No pertinent past surgical history.  Family History: No family history on file.  Social History:  reports that he has been smoking Cigarettes.  He has a 3 pack-year smoking history. He has never used smokeless tobacco. He reports that he uses illicit drugs. He reports that he does not  drink alcohol.  Additional Social History:  Alcohol / Drug Use Pain Medications: see list Prescriptions: see list Over the Counter: see list History of alcohol / drug use?: No history of alcohol / drug abuse Longest period of sobriety (when/how long): unknown Negative Consequences of Use:  (na) Withdrawal Symptoms:  (na) Allergies: No Known Allergies  Home Medications:  (Not in a hospital admission)  OB/GYN Status:  No LMP for male patient.  General Assessment Data Location of Assessment: [facility] ED Living  Arrangements: Other (Comment) [facility]) Can pt return to current living arrangement?: Yes Admission Status: Involuntary Is patient capable of signing voluntary admission?: No Transfer from: Acute Hospital Referral Source: Other (group home)  Education Status Is patient currently in school?: No  Risk to self Suicidal Ideation: No Suicidal Intent: No Is patient at risk for suicide?: No Suicidal Plan?: No Access to Means: No What has been your use of drugs/alcohol within the last 12 months?: pt denies Previous Attempts/Gestures: No How many times?: 0  Other Self Harm Risks: pt denies Triggers for Past Attempts: Other (Comment) (na) Intentional Self Injurious Behavior: None Family Suicide History: Unknown Recent stressful life event(s): Loss (Comment);Other (Comment) (pt's parents died 10 yrs ago, still grieving, recent hospita) Persecutory voices/beliefs?: No Depression: Yes Depression Symptoms: Despondent;Insomnia;Loss of interest in usual pleasures;Feeling worthless/self pity;Feeling angry/irritable Substance abuse history and/or treatment for substance abuse?: Yes (pt likes to sniff glue) Suicide prevention information given to non-admitted patients: Not applicable  Risk to Others Homicidal Ideation: No Thoughts of Harm to Others: No Current Homicidal Intent: No Current Homicidal Plan: No Access to Homicidal Means: No Identified Victim: na History of harm to others?: No Assessment of Violence: None Noted Violent Behavior Description: na - pt calm, cooperative Does patient have access to weapons?: No Criminal Charges Pending?: No Does patient have a court date: No  Psychosis Hallucinations: Auditory;Visual Delusions: None noted  Mental Status Report Appear/Hygiene: Disheveled Eye Contact: Good Motor Activity: Unremarkable Speech: Soft Level of Consciousness: Alert Mood: Other (Comment) (euthymic) Affect: Appropriate to circumstance Anxiety Level:  Minimal Thought Processes: Coherent Judgement: Impaired Orientation: Person;Place;Time;Situation Obsessive Compulsive Thoughts/Behaviors: None  Cognitive Functioning Concentration: Decreased Memory: Recent Intact;Remote Intact IQ: Average Insight: Poor Impulse Control: Fair Appetite: Fair Weight Loss: 0  Weight Gain: 0  Sleep: Decreased Total Hours of Sleep:  (has been sleeping in ED) Vegetative Symptoms: Decreased grooming  Prior Inpatient Therapy Prior Inpatient Therapy: Yes Prior Therapy Dates: January-April 2013 Prior Therapy Facilty/Provider(s): [facility] Reason for Treatment: psychosis  Prior Outpatient Therapy Prior Outpatient Therapy: Yes Prior Therapy Dates: ongoing day treatment Prior Therapy Facilty/Provider(s): [facility] Reason for Treatment: unknown  ADL Screening (condition at time of admission) Patient's cognitive ability adequate to safely complete daily activities?: Yes Independently performs ADLs?: Yes Communication: Independent Dressing (OT): Needs assistance Is this a change from baseline?: Pre-admission baseline Grooming: Needs assistance Is this a change from baseline?: Pre-admission baseline Feeding: Needs assistance Is this a change from baseline?: Pre-admission baseline Bathing: Needs assistance Is this a change from baseline?: Pre-admission baseline Toileting: Independent In/Out Bed: Independent Walks in Home: Independent Weakness of Legs: None Weakness of Arms/Hands: None  Home Assistive Devices/Equipment Home Assistive Devices/Equipment: None    Abuse/Neglect Assessment (Assessment to be complete while patient is alone) Physical Abuse: Denies Verbal Abuse: Denies Sexual Abuse: Denies Exploitation of patient/patient's resources: Denies Self-Neglect: Denies Values / Beliefs Cultural Requests During Hospitalization: None Spiritual Requests During Hospitalization: None Consults Spiritual Care Consult Needed: No Social Work  Consult Needed:  No Advance Directives (For Healthcare) Advance Directive: Patient does not have advance directive Pre-existing out of facility DNR order (yellow form or pink MOST form): Other (comment) (unknown)    Additional Information 1:1 In Past 12 Months?: No CIRT Risk: No Elopement Risk: No Does patient have medical clearance?: Yes     Disposition:  Disposition Disposition of Patient: Referred to;Inpatient treatment program Type of inpatient treatment program: Adult Patient referred [facility]: [facility] (Pt on [facility] wait [facility])  On Site Evaluation by:   Reviewed with Physician:  Pat Patrick, Rennis Harding 01/08/2012 6:06 PM

## 2012-01-08 NOTE — ED Notes (Signed)
Pt sleeping at this time with lights turned down.  Sitter remains at bedside.

## 2012-01-08 NOTE — ED Provider Notes (Signed)
Pt awaiting placement at psychiatric facility.  Will need to be [facility].  Pt denies complaints at this time.   Filed Vitals:   01/08/12 0556  BP: 113/76  Pulse: 60  Temp: 98.1 F (36.7 C)  Resp: 18   Heart: Regular rate and rhythm Lungs: Clear to auscultation  We will continue to monitor.  Celene Kras, MD 01/08/12 [phone]

## 2012-01-09 NOTE — ED Notes (Signed)
Patient is resting

## 2012-01-09 NOTE — ED Notes (Signed)
Gave Sitter a bathroom break. 

## 2012-01-09 NOTE — ED Notes (Signed)
Offered Sitter a lunch break, she started "she gets off at 3pm and she was fine".

## 2012-01-09 NOTE — ED Notes (Signed)
Dinner tray ordered, reg nonsharp 

## 2012-01-10 MED ORDER — ASPIRIN 81 MG PO CHEW
CHEWABLE_TABLET | ORAL | Status: AC
  Administered 2012-01-10: 81 mg
  Filled 2012-01-10: qty 1

## 2012-01-10 NOTE — ED Notes (Signed)
Patient got tray 

## 2012-01-10 NOTE — ED Notes (Signed)
Only 1 nicotine patch applied

## 2012-01-10 NOTE — BH Assessment (Signed)
Assessment Note   Daryl Campbell is an 61 y.o. male brought to [facility] by his group home for decreased cognitive functioning, mood swings, disorientation, and agitation and suicidal ideation after a long stay at [facility].  He was reassessed this day and showed improved mood-denying depression or suicidal ideation, and was alert and oriented to person, place, and situation.  He did show some anxiety about going to [facility] but remained calm and cooperative.  Clinician confirmed patient is on the [facility] wait list per Merlyn Albert at 0710.  Axis I: Chronic Paranoid Schizophrenia Axis II: Deferred Axis III:  Past Medical History  Diagnosis Date  . Hypertension   . Psychiatric disorder   . Hepatitis C   . Seizures   . Schizophrenia   . Bipolar affective    Axis IV: other psychosocial or environmental problems Axis V: 41-50 serious symptoms  Past Medical History:  Past Medical History  Diagnosis Date  . Hypertension   . Psychiatric disorder   . Hepatitis C   . Seizures   . Schizophrenia   . Bipolar affective     History reviewed. No pertinent past surgical history.  Family History: No family history on file.  Social History:  reports that he has been smoking Cigarettes.  He has a 3 pack-year smoking history. He has never used smokeless tobacco. He reports that he uses illicit drugs. He reports that he does not drink alcohol.  Additional Social History:  Alcohol / Drug Use Pain Medications: see list Prescriptions: see list Over the Counter: see list History of alcohol / drug use?: No history of alcohol / drug abuse Longest period of sobriety (when/how long): unknown Negative Consequences of Use:  (na) Withdrawal Symptoms:  (na) Allergies: No Known Allergies  Home Medications:  (Not in a hospital admission)  OB/GYN Status:  No LMP for male patient.  General Assessment Data Location of Assessment: [facility] ED Living Arrangements: Other (Comment) [facility]) Can pt  return to current living arrangement?: Yes Admission Status: Involuntary Is patient capable of signing voluntary admission?: No Transfer from: Acute Hospital Referral Source: Other (group home)  Education Status Is patient currently in school?: No  Risk to self Suicidal Ideation: No-Not Currently/Within Last 6 Months Suicidal Intent: No Is patient at risk for suicide?: No Suicidal Plan?: No Access to Means: No What has been your use of drugs/alcohol within the last 12 months?: denies, group home reports he sniffs glue Previous Attempts/Gestures: No How many times?: 0  Other Self Harm Risks: denies Triggers for Past Attempts: Other (Comment) (na) Intentional Self Injurious Behavior: None Family Suicide History: Unknown Recent stressful life event(s): Loss (Comment);Recent negative physical changes (parents died 10 years ago, hospitalized at [facility]) Persecutory voices/beliefs?: No Depression: No Depression Symptoms: Despondent;Insomnia;Loss of interest in usual pleasures;Feeling worthless/self pity;Feeling angry/irritable Substance abuse history and/or treatment for substance abuse?: Yes Suicide prevention information given to non-admitted patients: Not applicable  Risk to Others Homicidal Ideation: No Thoughts of Harm to Others: No Current Homicidal Intent: No Current Homicidal Plan: No Access to Homicidal Means: No Identified Victim: na History of harm to others?: No Assessment of Violence: None Noted Violent Behavior Description: na - pt calm, cooperative Does patient have access to weapons?: No Criminal Charges Pending?: No Does patient have a court date: No  Psychosis Hallucinations: None noted Delusions: None noted  Mental Status Report Appear/Hygiene: Improved Eye Contact: Good Motor Activity: Freedom of movement Speech: Logical/coherent Level of Consciousness: Alert Mood: Anxious Affect: Appropriate to  circumstance Anxiety Level: Minimal Thought Processes:  Coherent Judgement: Other (Comment) (hx of TBI) Orientation: Appropriate for developmental age Obsessive Compulsive Thoughts/Behaviors: None  Cognitive Functioning Concentration: Normal Memory: Recent Intact;Remote Intact IQ: Average Insight: Poor Impulse Control: Fair Appetite: Fair Weight Loss: 0  Weight Gain: 0  Sleep: Increased Total Hours of Sleep:  (has been sleeping in ED) Vegetative Symptoms: None  Prior Inpatient Therapy Prior Inpatient Therapy: Yes Prior Therapy Dates: January-April 2013 Prior Therapy Facilty/Provider(s): [facility] Reason for Treatment: psychosis  Prior Outpatient Therapy Prior Outpatient Therapy: Yes Prior Therapy Dates: ongoing day treatment Prior Therapy Facilty/Provider(s): [facility] Reason for Treatment: unknown  ADL Screening (condition at time of admission) Patient's cognitive ability adequate to safely complete daily activities?: Yes Independently performs ADLs?: Yes Communication: Independent Dressing (OT): Needs assistance Is this a change from baseline?: Pre-admission baseline Grooming: Needs assistance Is this a change from baseline?: Pre-admission baseline Feeding: Needs assistance Is this a change from baseline?: Pre-admission baseline Bathing: Needs assistance Is this a change from baseline?: Pre-admission baseline Toileting: Independent In/Out Bed: Independent Walks in Home: Independent Weakness of Legs: None Weakness of Arms/Hands: None  Home Assistive Devices/Equipment Home Assistive Devices/Equipment: None    Abuse/Neglect Assessment (Assessment to be complete while patient is alone) Physical Abuse: Denies Verbal Abuse: Denies Sexual Abuse: Denies Exploitation of patient/patient's resources: Denies Self-Neglect: Denies Values / Beliefs Cultural Requests During Hospitalization: None Spiritual Requests During Hospitalization: None Consults Spiritual Care Consult Needed: No Social Work Consult Needed:  No Merchant navy officer (For Healthcare) Advance Directive: Patient does not have advance directive Pre-existing out of facility DNR order (yellow form or pink MOST form): Other (comment) (unknown)    Additional Information 1:1 In Past 12 Months?: No CIRT Risk: No Elopement Risk: No Does patient have medical clearance?: Yes     Disposition:  Disposition Disposition of Patient: Referred to;Inpatient treatment program [facility]) Type of inpatient treatment program: Adult Patient referred to: [facility] (Confirmed on wait list per Merlyn Albert at 0710)  On Site Evaluation by:   Reviewed with Physician:     Steward Ros 01/10/2012 7:10 AM

## 2012-01-10 NOTE — ED Notes (Signed)
Pt states he is hearing voices, also states he does not want to go to [facility], wants to be dc home

## 2012-01-11 ENCOUNTER — Encounter (HOSPITAL_COMMUNITY): Payer: Self-pay | Admitting: *Deleted

## 2012-01-11 MED ORDER — LITHIUM CARBONATE 150 MG PO CAPS: 150.0000 mg | ORAL_CAPSULE | Freq: Two times a day (BID) | ORAL | Status: DC

## 2012-01-11 MED ORDER — IBUPROFEN 600 MG PO TABS: 600.0000 mg | ORAL_TABLET | Freq: Three times a day (TID) | ORAL | Status: AC | PRN

## 2012-01-11 MED ORDER — LEVETIRACETAM 500 MG PO TABS: 500.0000 mg | ORAL_TABLET | Freq: Two times a day (BID) | ORAL | Status: DC

## 2012-01-11 MED ORDER — ASPIRIN 81 MG PO CHEW: 81.0000 mg | CHEWABLE_TABLET | Freq: Every day | ORAL | Status: DC

## 2012-01-11 MED ORDER — SERTRALINE HCL 50 MG PO TABS: 100.0000 mg | ORAL_TABLET | Freq: Every day | ORAL | Status: DC

## 2012-01-11 MED ORDER — CLONAZEPAM 0.5 MG PO TABS: 1.0000 mg | ORAL_TABLET | Freq: Every day | ORAL | Status: DC

## 2012-01-11 MED ORDER — HALOPERIDOL 5 MG PO TABS: 5.0000 mg | ORAL_TABLET | Freq: Every day | ORAL | Status: DC

## 2012-01-11 MED ORDER — THIAMINE HCL 100 MG PO TABS: 100.0000 mg | ORAL_TABLET | Freq: Every day | ORAL | Status: DC

## 2012-01-11 NOTE — ED Provider Notes (Addendum)
Patient is resting comfortably alert, cooperative. Denies suicidal or homicidal ideation  Doug Sou, MD 01/11/12 [phone]  Specialist on call he asked to evaluate patient. Feels he is stable for discharge back to his group home. At 9:15 AM patient is alert ambulatory pleasant cooperative and asymptomatic.Plan discharged back to his group home.   Followup with Dr. Bruna Potter as needed, or return here Prescriptions for aspirin Clonopin Haldol Keppra lithium Zoloft and thiamine  Doug Sou, MD 01/11/12 [phone]

## 2012-01-11 NOTE — ED Notes (Signed)
Report received from B.B., RN.

## 2012-01-11 NOTE — ED Notes (Signed)
CSW contacted by RN re: Pt needing transport to adult day care center to meet up with other family care home residents. [facility] is not able to pick pt up at this time d/t it not being scheduled ahead of time. CSW will provide a taxi to take Pt to the center. Per RN, the other residents will be at the center by 10:15am.  Frederico Hamman, LCSW [phone]

## 2012-01-11 NOTE — ED Notes (Signed)
Report given to Jamar care giver at [facility], Jamar was informed of change in Lithium dose to 150 mg BID, a new RX will be prescribed by Dr. Ethelda Chick and patient will need a f/u appointment w/pt's PCP; Dr. Clyda Greener for a continued evaluation of change in medication. Jamar is also requesting that Dr. Ethelda Chick write a 30 day supply of pt's other daily medications.

## 2012-01-11 NOTE — ED Notes (Signed)
Alert, NAD, calm, interactive, sitting up eating breakfast, sitter at [facility].

## 2012-01-11 NOTE — Discharge Instructions (Signed)
Psychosis  Psychosis refers to a severe lack of understanding with reality. During a psychotic episode, you are not able to think clearly. During a psychotic episode, your responses and emotions are inappropriate and do not coincide with what is actually happening. You often have false beliefs about what is happening or who you are (delusions), and you may see, hear, taste, smell, or feel things that are not present (hallucinations). Psychosis is usually a severe symptom of a very serious mental health (psychiatric) condition, but it can sometimes be the result of a medical condition.  CAUSES    Psychiatric conditions, such as:   Schizophrenia.   Bipolar disorder.   Depression.   Personality disorders.   Alcohol or drug abuse.   Medical conditions, such as:   Brain injury.   Brain tumor.   Dementia.   Brain diseases, such as Alzheimer's, Parkinson's, or Huntington's disease.   Neurological diseases, such as epilepsy.   Genetic disorders.   Metabolic disorders.   Infections that affect the brain.   Certain prescription drugs.   Stroke.  SYMPTOMS    Unable to think or speak clearly or respond appropriately.   Disorganized thinking (thoughts jump from one thought to another).   Severe inappropriate behavior.   Delusions may include:   A strong belief that is odd, unrealistic, or false.   Feeling extremely fearful or suspicious (paranoid).   Believing you are someone else, have high importance, or have an altered identity.   Hallucinations.  DIAGNOSIS    Mental health evaluation.   Physical exam.   Blood tests.   Computerized magnetic scan (MRI) or other brain scans.  TREATMENT   Your caregiver will recommend a course of treatment that depends on the cause of the psychosis.  Treatment may include:   Monitoring and supportive care in the hospital.   Taking medicines (antipsychotic medicine) to reduce symptoms and balance chemicals in the brain.   Taking medicines to manage underlying  mental health conditions.   Therapy and other supportive programs outside of the hospital.   Treating an underlying medical condition.  If the cause of the psychosis can be treated or corrected, the outlook is good. Without treatment, psychotic episodes can cause danger to yourself or others. Treatment may be short-term or lifelong.  HOME CARE INSTRUCTIONS    Take all medicines as directed. This is important.   Use a pillbox or write down your medicine schedule to make sure you are taking them.   Check with your caregiver before using over-the-counter medicines, herbs, or supplements.   Seek individual and family support through therapy and mental health education (psychoeducation) programs. These will help you manage symptoms and side effects of medicines, learn life skills, and maintain a healthy routine.   Maintain a healthy lifestyle.   Exercise regularly.   Avoid alcohol and drugs.   Learn ways to reduce stress and cope with stress, such as yoga and meditation.   Talk about your feelings with family members or caregivers.   Make time for yourself to do things you enjoy.   Know the early warning signs of psychosis. Your caregiver will recommend steps to take when you notice symptoms such as:   Feeling anxious or preoccupied.   Having racing thoughts.   Changes in your interest in life and relationships.   Follow up with your caregivers for continued outpatient treatment as directed.  SEEK MEDICAL CARE IF:    Medicines do not seem to be helping.   You hear   voices telling you to do things.   You see, smell, or feel things that are not there.   You feel hopeless and overwhelmed.   You feel extremely fearful and suspicious that something will harm you.   You feel like you cannot leave your house.   You have trouble taking care of yourself.   You experience side effects of medicines, such as changes in sleep patterns, dizziness, weight gain, restlessness, movement changes, muscle spasms, or  tremors.  SEEK IMMEDIATE MEDICAL CARE IF:   Severe psychotic symptoms present a safety issue (such as an urge to hurt yourself or others).  MAKE SURE YOU:    Understand these instructions.   Will watch your condition.   Will get help right away if you are not doing well or get worse.  FOR MORE INFORMATION   National Institute of Mental Health: www.nimh.nih.gov  Document Released: 02/07/2010 Document Revised: 08/09/2011 Document Reviewed: 02/07/2010  Exit[employer] Patient Information 2012 [employer].

## 2012-01-26 ENCOUNTER — Emergency Department (HOSPITAL_COMMUNITY)
Admission: EM | Admit: 2012-01-26 | Discharge: 2012-01-26 | Disposition: A | Discharge status: Home / Self Care | Payer: Medicare Other | Attending: Emergency Medicine | Admitting: Emergency Medicine

## 2012-01-26 ENCOUNTER — Encounter (HOSPITAL_COMMUNITY): Payer: Self-pay

## 2012-01-26 DIAGNOSIS — M549 Dorsalgia, unspecified: Secondary | ICD-10-CM

## 2012-01-26 DIAGNOSIS — Z79899 Other long term (current) drug therapy: Secondary | ICD-10-CM | POA: Insufficient documentation

## 2012-01-26 DIAGNOSIS — Z7982 Long term (current) use of aspirin: Secondary | ICD-10-CM | POA: Insufficient documentation

## 2012-01-26 DIAGNOSIS — I10 Essential (primary) hypertension: Secondary | ICD-10-CM | POA: Insufficient documentation

## 2012-01-26 DIAGNOSIS — F17200 Nicotine dependence, unspecified, uncomplicated: Secondary | ICD-10-CM | POA: Insufficient documentation

## 2012-01-26 DIAGNOSIS — F319 Bipolar disorder, unspecified: Secondary | ICD-10-CM | POA: Insufficient documentation

## 2012-01-26 DIAGNOSIS — B1920 Unspecified viral hepatitis C without hepatic coma: Secondary | ICD-10-CM | POA: Insufficient documentation

## 2012-01-26 DIAGNOSIS — F209 Schizophrenia, unspecified: Secondary | ICD-10-CM | POA: Insufficient documentation

## 2012-01-26 MED ORDER — IBUPROFEN 200 MG PO TABS
600.0000 mg | ORAL_TABLET | Freq: Once | ORAL | Status: AC
  Administered 2012-01-26: 600 mg via ORAL
  Filled 2012-01-26: qty 3

## 2012-01-26 MED ORDER — HYDROCODONE-ACETAMINOPHEN 5-500 MG PO TABS: 1.0000 | ORAL_TABLET | Freq: Four times a day (QID) | ORAL | Status: AC | PRN

## 2012-01-26 MED ORDER — IBUPROFEN 600 MG PO TABS: 600.0000 mg | ORAL_TABLET | Freq: Three times a day (TID) | ORAL | Status: AC | PRN

## 2012-01-26 MED ORDER — OXYCODONE-ACETAMINOPHEN 5-325 MG PO TABS
1.0000 | ORAL_TABLET | Freq: Once | ORAL | Status: AC
  Administered 2012-01-26: 1 via ORAL
  Filled 2012-01-26: qty 1

## 2012-01-26 NOTE — ED Provider Notes (Signed)
History   This chart was scribed for Lyanne Co, MD by Melba Coon. The patient was seen in room STRE2/STRE2 and the patient's care was started at 11:15AM.    CSN: [id number]  Arrival date & time 01/26/12  1047   First MD Initiated Contact with Patient 01/26/12 1104      Chief Complaint  Patient presents with  . Back Pain    (Consider location/radiation/quality/duration/timing/severity/associated sxs/prior treatment) HPI Daryl Campbell is a 61 y.o. male who presents to the Emergency Department complaining of intermittent, moderate, right-sided back pain with an onset 2 days ago. Pt presents from his group home and states that he may have pulled a muscle from heavy lifting. OTC pain meds taken at home. Spasm-like back pain present. Abd pain from gas present. No HA, fever, neck pain, sore throat, rash, CP, SOB, n/v/d, dysuria, or extremity pain, edema, weakness, numbness, or tingling. No known allergies. No other pertinent medical symptoms.   Past Medical History  Diagnosis Date  . Hypertension   . Psychiatric disorder   . Hepatitis C   . Seizures   . Schizophrenia   . Bipolar affective     History reviewed. No pertinent past surgical history.  History reviewed. No pertinent family history.  History  Substance Use Topics  . Smoking status: Current Everyday Smoker -- 1.0 packs/day for 3 years    Types: Cigarettes  . Smokeless tobacco: Never Used  . Alcohol Use: No      Review of Systems 10 Systems reviewed and all are negative for acute change except as noted in the HPI.   Allergies  Review of patient's allergies indicates no known allergies.  Home Medications   Current Outpatient Rx  Name Route Sig Dispense Refill  . ASPIRIN 81 MG PO CHEW Oral Chew 1 tablet (81 mg total) by mouth daily. 30 tablet 0  . ASPIRIN EC 81 MG PO TBEC Oral Take 81 mg by mouth daily.    Marland Kitchen VITAMIN D 1000 UNITS PO TABS Oral Take 1,000 Units by mouth daily.    Marland Kitchen CLONAZEPAM 0.5 MG PO  TABS Oral Take 2 tablets (1 mg total) by mouth at bedtime. 30 tablet 0  . CLONAZEPAM 1 MG PO TABS Oral Take 1 mg by mouth at bedtime.    Marland Kitchen DOXYCYCLINE HYCLATE 100 MG PO CPEP Oral Take 100 mg by mouth 2 (two) times daily.    Marland Kitchen HALOPERIDOL 5 MG PO TABS Oral Take 5 mg by mouth at bedtime.    Marland Kitchen HALOPERIDOL 5 MG PO TABS Oral Take 1 tablet (5 mg total) by mouth at bedtime. 30 tablet 0  . LEVETIRACETAM 500 MG PO TABS Oral Take 500 mg by mouth 2 (two) times daily.    Marland Kitchen LEVETIRACETAM 500 MG PO TABS Oral Take 1 tablet (500 mg total) by mouth every 12 (twelve) hours. 60 tablet 0  . LITHIUM CARBONATE 150 MG PO CAPS Oral Take 1 capsule (150 mg total) by mouth 2 (two) times daily with a meal. 60 capsule 0  . LITHIUM CARBONATE PO Oral Take 200 mg by mouth 2 (two) times daily. 1 tablet in the morning and 2 at bedtime    . MELOXICAM 15 MG PO TABS Oral Take 1 tablet (15 mg total) by mouth daily. 15 tablet 0  . THERA M PLUS PO TABS Oral Take 1 tablet by mouth daily.    Marland Kitchen POLYETHYLENE GLYCOL 3350 PO PACK Oral Take 17 g by mouth daily.    Marland Kitchen  SERTRALINE HCL 100 MG PO TABS Oral Take 100 mg by mouth daily.    . SERTRALINE HCL 50 MG PO TABS Oral Take 2 tablets (100 mg total) by mouth daily. 30 tablet 0  . THIAMINE HCL 100 MG PO TABS Oral Take 100 mg by mouth daily.    . THIAMINE HCL 100 MG PO TABS Oral Take 1 tablet (100 mg total) by mouth daily. 100 tablet 0    BP 119/96  Pulse 60  Temp 97.6 F (36.4 C)  Resp 18  SpO2 96%  Physical Exam  Nursing note and vitals reviewed. Constitutional: He is oriented to person, place, and time. He appears well-developed and well-nourished. No distress.  HENT:  Head: Normocephalic and atraumatic.  Eyes: EOM are normal.  Neck: Neck supple. No tracheal deviation present.  Cardiovascular: Normal rate, regular rhythm and normal heart sounds.   No murmur heard. Pulmonary/Chest: Effort normal and breath sounds normal. No respiratory distress. He has no wheezes.  Abdominal: Soft.  There is no tenderness.  Musculoskeletal: Normal range of motion. He exhibits tenderness (rt paralumbar spinal tenderness).       no lumbarspinal tenderness or stepoffs  Neurological: He is alert and oriented to person, place, and time.       5/5 bilateral lower extremity strength; major muscle groups intact  Skin: Skin is warm and dry. No rash noted.  Psychiatric: He has a normal mood and affect. His behavior is normal.    ED Course  Procedures (including critical care time)  DIAGNOSTIC STUDIES: Oxygen Saturation is 100% on room air, normal by my interpretation.    COORDINATION OF CARE:  11:19AM - EDMD will order and Rx pain meds for the pt. Pt ready for d/c.   Labs Reviewed - No data to display No results found.   1. Back pain       MDM  the patient has 5 out of 5 strength his bilateral lower extremities.  His abdomen is benign.  This seems to be more of a lumbar strain.  Anti-inflammatory pain medicine as well as Vicodin for breakthrough pain given.  The patient has no urinary or bladder complaints.  No indication for imaging.  No signs of trauma  I personally performed the services described in this documentation, which was scribed in my presence. The recorded information has been reviewed and considered.          Lyanne Co, MD 01/26/12 [phone]

## 2012-01-26 NOTE — ED Notes (Signed)
Pt complains of back pain sts it is a pulled muscle from several days ago.

## 2012-01-26 NOTE — Discharge Instructions (Signed)
Back Exercises Back exercises help treat and prevent back injuries. The goal of back exercises is to increase the strength of your abdominal and back muscles and the flexibility of your back. These exercises should be started when you no longer have back pain. Back exercises include:  Pelvic Tilt. Lie on your back with your knees bent. Tilt your pelvis until the lower part of your back is against the floor. Hold this position 5 to 10 sec and repeat 5 to 10 times.   Knee to Chest. Pull first 1 knee up against your chest and hold for 20 to 30 seconds, repeat this with the other knee, and then both knees. This may be done with the other leg straight or bent, whichever feels better.   Sit-Ups or Curl-Ups. Bend your knees 90 degrees. Start with tilting your pelvis, and do a partial, slow sit-up, lifting your trunk only 30 to 45 degrees off the floor. Take at least 2 to 3 seconds for each sit-up. Do not do sit-ups with your knees out straight. If partial sit-ups are difficult, simply do the above but with only tightening your abdominal muscles and holding it as directed.   Hip-Lift. Lie on your back with your knees flexed 90 degrees. Push down with your feet and shoulders as you raise your hips a couple inches off the floor; hold for 10 seconds, repeat 5 to 10 times.   Back arches. Lie on your stomach, propping yourself up on bent elbows. Slowly press on your hands, causing an arch in your low back. Repeat 3 to 5 times. Any initial stiffness and discomfort should lessen with repetition over time.   Shoulder-Lifts. Lie face down with arms beside your body. Keep hips and torso pressed to floor as you slowly lift your head and shoulders off the floor.  Do not overdo your exercises, especially in the beginning. Exercises may cause you some mild back discomfort which lasts for a few minutes; however, if the pain is more severe, or lasts for more than 15 minutes, do not continue exercises until you see your  caregiver. Improvement with exercise therapy for back problems is slow.  See your caregivers for assistance with developing a proper back exercise program. Document Released: 09/27/2004 Document Revised: 08/09/2011 Document Reviewed: 08/20/2005 Exit[employer] Patient Information 2012 [employer]. 

## 2012-02-02 ENCOUNTER — Emergency Department (HOSPITAL_COMMUNITY)
Admission: EM | Admit: 2012-02-02 | Discharge: 2012-02-02 | Disposition: A | Discharge status: Other Acute Inpatient Hospital | Payer: Medicare Other | Attending: Emergency Medicine | Admitting: Emergency Medicine

## 2012-02-02 ENCOUNTER — Encounter (HOSPITAL_COMMUNITY): Payer: Self-pay | Admitting: *Deleted

## 2012-02-02 ENCOUNTER — Emergency Department (HOSPITAL_COMMUNITY): Payer: Medicare Other

## 2012-02-02 DIAGNOSIS — Z79899 Other long term (current) drug therapy: Secondary | ICD-10-CM | POA: Insufficient documentation

## 2012-02-02 DIAGNOSIS — I10 Essential (primary) hypertension: Secondary | ICD-10-CM | POA: Insufficient documentation

## 2012-02-02 DIAGNOSIS — Z046 Encounter for general psychiatric examination, requested by authority: Secondary | ICD-10-CM | POA: Insufficient documentation

## 2012-02-02 DIAGNOSIS — F319 Bipolar disorder, unspecified: Secondary | ICD-10-CM | POA: Insufficient documentation

## 2012-02-02 DIAGNOSIS — F209 Schizophrenia, unspecified: Secondary | ICD-10-CM | POA: Insufficient documentation

## 2012-02-02 LAB — RAPID URINE DRUG SCREEN, HOSP PERFORMED: Opiates: NOT DETECTED

## 2012-02-02 LAB — COMPREHENSIVE METABOLIC PANEL
AST: 33 U/L (ref 0–37)
CO2: 27 mEq/L (ref 19–32)
Chloride: 106 mEq/L (ref 96–112)
Creatinine, Ser: 0.87 mg/dL (ref 0.50–1.35)
GFR calc Af Amer: >90 mL/min (ref >90)
GFR calc non Af Amer: >90 mL/min (ref >90)
Glucose, Bld: 96 mg/dL (ref 70–99)
Total Bilirubin: 1.2 mg/dL (ref 0.3–1.2)

## 2012-02-02 LAB — URINALYSIS, ROUTINE W REFLEX MICROSCOPIC
Glucose, UA: NEGATIVE mg/dL
Hgb urine dipstick: NEGATIVE
Specific Gravity, Urine: 1.016 (ref 1.005–1.030)

## 2012-02-02 LAB — CBC
MCH: 31.3 pg (ref 26.0–34.0)
MCHC: 34.7 g/dL (ref 30.0–36.0)
Platelets: 171 10*3/uL (ref 150–400)
RBC: 4.63 MIL/uL (ref 4.22–5.81)

## 2012-02-02 LAB — DIFFERENTIAL
Basophils Relative: 0 % (ref 0–1)
Eosinophils Absolute: 0.1 10*3/uL (ref 0.0–0.7)
Lymphs Abs: 2.3 10*3/uL (ref 0.7–4.0)
Neutrophils Relative %: 54 % (ref 43–77)

## 2012-02-02 LAB — LITHIUM LEVEL: Lithium Lvl: 0.53 mEq/L — ABNORMAL LOW (ref 0.80–1.40)

## 2012-02-02 NOTE — ED Notes (Signed)
Information faxed to [facility] 

## 2012-02-02 NOTE — ED Notes (Signed)
Pt from [facility] accompanied by [employer] for medical clearance. Pt calm and cooperative at present and reports involuntary mouth tremors that started 3 nights ago. Pt denies SI/HI and auditory or visual hallucinations.

## 2012-02-02 NOTE — Discharge Instructions (Signed)
Go to [facility]

## 2012-02-02 NOTE — ED Provider Notes (Signed)
History     CSN: [id number]  Arrival date & time 02/02/12  1445   First MD Initiated Contact with Patient 02/02/12 1504      Chief Complaint  Patient presents with  . V70.1   level V caveat due to psychiatric disorder.  (Consider location/radiation/quality/duration/timing/severity/associated sxs/prior treatment) The history is provided by the patient and medical records.   patient's brought in the hospital from [facility] for medical evaluation and medical clearance for placement at [facility]. He has a history of schizophrenia. His been hearing more voices recently. He states that God is talking to him telling him that he needs to live longer. He denies suicidal or homicidal thoughts. He's also had some movements of his mouth and tongue. He states he does move around. No headache. He states he has had episodes of this before. He was taken to [facility] from a group home.  Past Medical History  Diagnosis Date  . Hypertension   . Psychiatric disorder   . Hepatitis C   . Seizures   . Schizophrenia   . Bipolar affective     History reviewed. No pertinent past surgical history.  History reviewed. No pertinent family history.  History  Substance Use Topics  . Smoking status: Current Everyday Smoker -- 0.5 packs/day for 3 years    Types: Cigarettes  . Smokeless tobacco: Never Used  . Alcohol Use: No      Review of Systems  Unable to perform ROS Respiratory: Negative for shortness of breath.   Musculoskeletal: Negative for back pain.  Skin: Negative for rash.  Neurological: Negative for tremors and syncope.  Psychiatric/Behavioral: Positive for hallucinations.    Allergies  Review of patient's allergies indicates no known allergies.  Home Medications   Current Outpatient Rx  Name Route Sig Dispense Refill  . ASPIRIN EC 81 MG PO TBEC Oral Take 81 mg by mouth daily.    Marland Kitchen BENZTROPINE MESYLATE 1 MG PO TABS Oral Take 1 mg by mouth 2 (two) times daily.    Marland Kitchen VITAMIN D 1000  UNITS PO TABS Oral Take 1,000 Units by mouth daily.    Marland Kitchen CLONAZEPAM 0.5 MG PO TABS Oral Take 2 tablets (1 mg total) by mouth at bedtime. 30 tablet 0  . CLONAZEPAM 1 MG PO TABS Oral Take 1 mg by mouth at bedtime.    Marland Kitchen DOXYCYCLINE HYCLATE 100 MG PO CPEP Oral Take 100 mg by mouth 2 (two) times daily.    Marland Kitchen HALOPERIDOL 5 MG PO TABS Oral Take 5 mg by mouth at bedtime.    Marland Kitchen HYDROCODONE-ACETAMINOPHEN 5-500 MG PO TABS Oral Take 1-2 tablets by mouth every 6 (six) hours as needed for pain. 15 tablet 0  . IBUPROFEN 600 MG PO TABS Oral Take 1 tablet (600 mg total) by mouth every 8 (eight) hours as needed for pain. 15 tablet 0  . LEVETIRACETAM 500 MG PO TABS Oral Take 500 mg by mouth 2 (two) times daily.    Marland Kitchen LITHIUM CARBONATE PO Oral Take 200 mg by mouth 2 (two) times daily. 1 tablet in the morning and 2 at bedtime    . THERA M PLUS PO TABS Oral Take 1 tablet by mouth daily.    Marland Kitchen POLYETHYLENE GLYCOL 3350 PO PACK Oral Take 17 g by mouth daily.    . SERTRALINE HCL 100 MG PO TABS Oral Take 100 mg by mouth daily.    . THIAMINE HCL 100 MG PO TABS Oral Take 100 mg by mouth daily.  BP 126/72  Pulse 85  Temp(Src) 98.4 F (36.9 C) (Oral)  Resp 18  Wt 185 lb (83.915 kg)  SpO2 98%  Physical Exam  Nursing note and vitals reviewed. Constitutional: He is oriented to person, place, and time. He appears well-developed and well-nourished.  HENT:  Head: Normocephalic and atraumatic.  Eyes: EOM are normal. Pupils are equal, round, and reactive to light.  Neck: Normal range of motion. Neck supple.  Cardiovascular: Normal rate, regular rhythm and normal heart sounds.   No murmur heard. Pulmonary/Chest: Effort normal and breath sounds normal.  Abdominal: Soft. Bowel sounds are normal. He exhibits no distension and no mass. There is no tenderness. There is no rebound and no guarding.  Musculoskeletal: Normal range of motion. He exhibits no edema.  Neurological: He is alert and oriented to person, place, and time.  No cranial nerve deficit.  Skin: Skin is warm and dry.  Psychiatric:       Patient is awake and appropriate. He does have some repetitive movements in his mouth. He does state that he hears a voice talking to him that is God.    ED Course  Procedures (including critical care time)  Labs Reviewed  COMPREHENSIVE METABOLIC PANEL - Abnormal; Notable for the following:    Potassium 3.4 (*)    All other components within normal limits  DIFFERENTIAL - Abnormal; Notable for the following:    Monocytes Relative 13 (*)    All other components within normal limits  LITHIUM LEVEL - Abnormal; Notable for the following:    Lithium Lvl 0.53 (*)    All other components within normal limits  CBC  URINE RAPID DRUG SCREEN (HOSP PERFORMED)  URINALYSIS, ROUTINE W REFLEX MICROSCOPIC  URINE RAPID DRUG SCREEN (HOSP PERFORMED)  URINALYSIS, ROUTINE W REFLEX MICROSCOPIC   Dg Chest 2 View  02/02/2012  *RADIOLOGY REPORT*  Clinical Data: Medical clearance.  CHEST - 2 VIEW  Comparison: 01/06/2012  Findings: Linear densities in the lung bases and left upper lobe, compatible with scarring.  No acute opacities or effusions.  Heart is normal size.  No acute bony abnormality.  IMPRESSION: No acute cardiopulmonary disease.  Original Report Authenticated By: Cyndie Chime, M.D.     1. Schizophrenia       MDM  Patient was sent in by [facility] for lab work and x-ray. Laboratory shows mild hypokalemia and slightly low lithium level. He's been accepted   [employer]. Rubin Payor, MD 02/02/12 1800

## 2012-03-01 ENCOUNTER — Encounter (HOSPITAL_COMMUNITY): Payer: Self-pay | Admitting: Emergency Medicine

## 2012-03-01 ENCOUNTER — Emergency Department (HOSPITAL_COMMUNITY)
Admission: EM | Admit: 2012-03-01 | Discharge: 2012-03-04 | Disposition: A | Discharge status: Other Acute Inpatient Hospital | Payer: Medicare Other | Attending: Emergency Medicine | Admitting: Emergency Medicine

## 2012-03-01 DIAGNOSIS — Z8659 Personal history of other mental and behavioral disorders: Secondary | ICD-10-CM | POA: Insufficient documentation

## 2012-03-01 DIAGNOSIS — I10 Essential (primary) hypertension: Secondary | ICD-10-CM | POA: Insufficient documentation

## 2012-03-01 DIAGNOSIS — F29 Unspecified psychosis not due to a substance or known physiological condition: Secondary | ICD-10-CM | POA: Insufficient documentation

## 2012-03-01 DIAGNOSIS — G40909 Epilepsy, unspecified, not intractable, without status epilepticus: Secondary | ICD-10-CM | POA: Insufficient documentation

## 2012-03-01 DIAGNOSIS — F23 Brief psychotic disorder: Secondary | ICD-10-CM

## 2012-03-01 DIAGNOSIS — Z7982 Long term (current) use of aspirin: Secondary | ICD-10-CM | POA: Insufficient documentation

## 2012-03-01 DIAGNOSIS — B1920 Unspecified viral hepatitis C without hepatic coma: Secondary | ICD-10-CM | POA: Insufficient documentation

## 2012-03-01 DIAGNOSIS — F319 Bipolar disorder, unspecified: Secondary | ICD-10-CM | POA: Insufficient documentation

## 2012-03-01 DIAGNOSIS — Z79899 Other long term (current) drug therapy: Secondary | ICD-10-CM | POA: Insufficient documentation

## 2012-03-01 NOTE — ED Notes (Signed)
Pt. In blue scrubs and red socks. 

## 2012-03-01 NOTE — ED Notes (Signed)
1 bag of belongings located in locker #4 in triage 

## 2012-03-01 NOTE — ED Notes (Signed)
Pt. Is IVC; [employer] at bedside

## 2012-03-01 NOTE — ED Notes (Signed)
Pt. currently voluntary; escorted by [employer]. [employer] stated IVC papers are on the way.

## 2012-03-01 NOTE — ED Notes (Signed)
Pt. and belongings both wanded by security 

## 2012-03-01 NOTE — ED Notes (Signed)
Pt alert, arrives via [employer] under IVC, per [employer] papers, threatening staff, resp even unlabored, skin pwd, denies SI/HI

## 2012-03-02 LAB — COMPREHENSIVE METABOLIC PANEL
AST: 38 U/L — ABNORMAL HIGH (ref 0–37)
BUN: 16 mg/dL (ref 6–23)
CO2: 20 mEq/L (ref 19–32)
Chloride: 104 mEq/L (ref 96–112)
Creatinine, Ser: 0.95 mg/dL (ref 0.50–1.35)
GFR calc non Af Amer: 89 mL/min — ABNORMAL LOW (ref >90)
Total Bilirubin: 0.9 mg/dL (ref 0.3–1.2)

## 2012-03-02 LAB — HEPATIC FUNCTION PANEL
ALT: 35 U/L (ref 0–53)
AST: 45 U/L — ABNORMAL HIGH (ref 0–37)
Alkaline Phosphatase: 73 U/L (ref 39–117)
Bilirubin, Direct: 0.1 mg/dL (ref 0.0–0.3)
Indirect Bilirubin: 0.5 mg/dL (ref 0.3–0.9)
Total Bilirubin: 0.6 mg/dL (ref 0.3–1.2)

## 2012-03-02 LAB — ETHANOL: Alcohol, Ethyl (B): <11 mg/dL (ref 0–11)

## 2012-03-02 LAB — RAPID URINE DRUG SCREEN, HOSP PERFORMED
Opiates: NOT DETECTED
Tetrahydrocannabinol: NOT DETECTED

## 2012-03-02 LAB — CBC
HCT: 38.4 % — ABNORMAL LOW (ref 39.0–52.0)
MCV: 89.9 fL (ref 78.0–100.0)
RBC: 4.27 MIL/uL (ref 4.22–5.81)
WBC: 7.4 10*3/uL (ref 4.0–10.5)

## 2012-03-02 MED ORDER — LORAZEPAM 2 MG/ML IJ SOLN
2.0000 mg | Freq: Four times a day (QID) | INTRAMUSCULAR | Status: DC | PRN
  Filled 2012-03-02: qty 1

## 2012-03-02 MED ORDER — VITAMIN B-1 100 MG PO TABS
100.0000 mg | ORAL_TABLET | Freq: Every day | ORAL | Status: DC
  Administered 2012-03-02 – 2012-03-03 (×2): 100 mg via ORAL
  Filled 2012-03-02 (×2): qty 1

## 2012-03-02 MED ORDER — LORAZEPAM 2 MG/ML IJ SOLN
2.0000 mg | Freq: Once | INTRAMUSCULAR | Status: AC
  Administered 2012-03-02: 2 mg via INTRAMUSCULAR

## 2012-03-02 MED ORDER — ZIPRASIDONE MESYLATE 20 MG IM SOLR
20.0000 mg | Freq: Once | INTRAMUSCULAR | Status: AC
  Administered 2012-03-02: 20 mg via INTRAMUSCULAR
  Filled 2012-03-02: qty 20

## 2012-03-02 MED ORDER — LORAZEPAM 1 MG PO TABS
2.0000 mg | ORAL_TABLET | Freq: Once | ORAL | Status: AC
  Administered 2012-03-02: 2 mg via ORAL

## 2012-03-02 MED ORDER — VITAMIN D3 25 MCG (1000 UNIT) PO TABS
1000.0000 [IU] | ORAL_TABLET | Freq: Every day | ORAL | Status: DC
  Administered 2012-03-02 – 2012-03-03 (×2): 1000 [IU] via ORAL
  Filled 2012-03-02 (×3): qty 1

## 2012-03-02 MED ORDER — HALOPERIDOL LACTATE 5 MG/ML IJ SOLN
5.0000 mg | Freq: Four times a day (QID) | INTRAMUSCULAR | Status: DC | PRN
  Filled 2012-03-02: qty 1

## 2012-03-02 MED ORDER — HALOPERIDOL 5 MG PO TABS
5.0000 mg | ORAL_TABLET | Freq: Two times a day (BID) | ORAL | Status: DC
  Administered 2012-03-02 – 2012-03-03 (×3): 5 mg via ORAL
  Filled 2012-03-02 (×3): qty 1

## 2012-03-02 MED ORDER — ADULT MULTIVITAMIN W/MINERALS CH
1.0000 | ORAL_TABLET | Freq: Every day | ORAL | Status: DC
  Administered 2012-03-02 – 2012-03-03 (×2): 1 via ORAL
  Filled 2012-03-02 (×2): qty 1

## 2012-03-02 MED ORDER — BENZTROPINE MESYLATE 1 MG/ML IJ SOLN: 1.0000 mg | Freq: Two times a day (BID) | INTRAMUSCULAR | Status: DC

## 2012-03-02 MED ORDER — LORAZEPAM 1 MG PO TABS
2.0000 mg | ORAL_TABLET | Freq: Two times a day (BID) | ORAL | Status: DC
  Administered 2012-03-02 – 2012-03-03 (×4): 2 mg via ORAL
  Filled 2012-03-02 (×4): qty 2
  Filled 2012-03-02: qty 1

## 2012-03-02 MED ORDER — POLYETHYLENE GLYCOL 3350 17 G PO PACK
17.0000 g | PACK | Freq: Every day | ORAL | Status: DC
  Administered 2012-03-02 – 2012-03-03 (×2): 17 g via ORAL
  Filled 2012-03-02 (×3): qty 1

## 2012-03-02 MED ORDER — LITHIUM CARBONATE 150 MG PO CAPS
150.0000 mg | ORAL_CAPSULE | Freq: Two times a day (BID) | ORAL | Status: DC
  Administered 2012-03-02 – 2012-03-03 (×4): 150 mg via ORAL
  Filled 2012-03-02 (×7): qty 1

## 2012-03-02 MED ORDER — ONDANSETRON HCL 4 MG PO TABS: 4.0000 mg | ORAL_TABLET | Freq: Three times a day (TID) | ORAL | Status: DC | PRN

## 2012-03-02 MED ORDER — HALOPERIDOL LACTATE 5 MG/ML IJ SOLN
5.0000 mg | Freq: Once | INTRAMUSCULAR | Status: AC
  Administered 2012-03-02: 5 mg via INTRAMUSCULAR
  Filled 2012-03-02: qty 1

## 2012-03-02 MED ORDER — ACETAMINOPHEN 325 MG PO TABS: 650.0000 mg | ORAL_TABLET | ORAL | Status: DC | PRN

## 2012-03-02 MED ORDER — LORAZEPAM 1 MG PO TABS
2.0000 mg | ORAL_TABLET | Freq: Once | ORAL | Status: AC
  Administered 2012-03-02: 2 mg via ORAL
  Filled 2012-03-02: qty 2

## 2012-03-02 MED ORDER — LEVETIRACETAM 500 MG PO TABS
500.0000 mg | ORAL_TABLET | Freq: Two times a day (BID) | ORAL | Status: DC
  Administered 2012-03-02 – 2012-03-03 (×5): 500 mg via ORAL
  Filled 2012-03-02 (×7): qty 1

## 2012-03-02 MED ORDER — SERTRALINE HCL 50 MG PO TABS
100.0000 mg | ORAL_TABLET | Freq: Every day | ORAL | Status: DC
  Administered 2012-03-02 – 2012-03-03 (×2): 100 mg via ORAL
  Filled 2012-03-02 (×2): qty 2

## 2012-03-02 MED ORDER — IBUPROFEN 600 MG PO TABS: 600.0000 mg | ORAL_TABLET | Freq: Three times a day (TID) | ORAL | Status: DC | PRN

## 2012-03-02 MED ORDER — HALOPERIDOL LACTATE 5 MG/ML IJ SOLN: 5.0000 mg | Freq: Once | INTRAMUSCULAR | Status: DC

## 2012-03-02 MED ORDER — HALOPERIDOL 5 MG PO TABS
5.0000 mg | ORAL_TABLET | Freq: Every day | ORAL | Status: DC
Start: 2012-03-02 — End: 2012-03-04
  Administered 2012-03-02 – 2012-03-03 (×3): 5 mg via ORAL
  Filled 2012-03-02 (×3): qty 1

## 2012-03-02 MED ORDER — DOXYCYCLINE HYCLATE 100 MG PO TABS
100.0000 mg | ORAL_TABLET | Freq: Two times a day (BID) | ORAL | Status: DC
  Administered 2012-03-02 – 2012-03-03 (×5): 100 mg via ORAL
  Filled 2012-03-02 (×5): qty 1

## 2012-03-02 MED ORDER — HALOPERIDOL LACTATE 5 MG/ML IJ SOLN
5.0000 mg | Freq: Once | INTRAMUSCULAR | Status: AC
  Administered 2012-03-02: 5 mg via INTRAMUSCULAR

## 2012-03-02 MED ORDER — ASPIRIN EC 81 MG PO TBEC
81.0000 mg | DELAYED_RELEASE_TABLET | Freq: Every day | ORAL | Status: DC
  Administered 2012-03-02 – 2012-03-03 (×2): 81 mg via ORAL
  Filled 2012-03-02 (×3): qty 1

## 2012-03-02 MED ORDER — LORAZEPAM 2 MG/ML IJ SOLN
INTRAMUSCULAR | Status: AC
  Administered 2012-03-02: 2 mg via INTRAMUSCULAR
  Filled 2012-03-02: qty 1

## 2012-03-02 MED ORDER — BENZTROPINE MESYLATE 1 MG PO TABS
1.0000 mg | ORAL_TABLET | Freq: Two times a day (BID) | ORAL | Status: DC
Start: 2012-03-02 — End: 2012-03-04
  Administered 2012-03-02 – 2012-03-03 (×5): 1 mg via ORAL
  Filled 2012-03-02 (×5): qty 1

## 2012-03-02 NOTE — ED Notes (Signed)
Pt starting to walk up and down halls and yell out to God and becoming increasingly agitated. Pt now making more sexual remarks. MD alerted.

## 2012-03-02 NOTE — ED Notes (Signed)
md alerted that pt is still highly agitated and restless
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
## 2012-03-02 NOTE — ED Notes (Signed)
md will put in orders for wrist restraints

## 2012-03-02 NOTE — ED Notes (Signed)
Pt information sent to [facility] for review and received by the charge nurse Larita Fife.  ACT spoke with Larita Fife and she will contact ACT when she needs more information.  [facility] does not have any beds at this time.  Authorization and application not done at this time but will be completed at the appropriate time.  [facility] has demographics, face sheets, pt timeline of tx, H&P exam by MD, labs, etc.
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
## 2012-03-02 NOTE — ED Notes (Signed)
Telepsych being conducted 

## 2012-03-02 NOTE — ED Provider Notes (Addendum)
Pt calm and cooperative. Pt under ivc. Will get tele-psych consult.  Toy Baker, MD 03/02/12 0747  10:30 AM Pt with more agitation and will give haldol  Toy Baker, MD 03/02/12 1030  3:46 PM Pt seen again and with more agitation, restrained for his safety   Toy Baker, MD 03/02/12 1547

## 2012-03-02 NOTE — ED Notes (Signed)
Pt. shouting out for God and wandering. RN Barbara Cower notified. [employer] and Security at bedside
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
## 2012-03-02 NOTE — ED Notes (Signed)
First Opinion document completed and placed in physical chart.

## 2012-03-02 NOTE — ED Notes (Signed)
Pt still wandering around halls and trying to go into other pts rooms. Pt redirected and encouraged to take a nap. Tv put on the Bible channel per pt request.

## 2012-03-02 NOTE — ED Notes (Addendum)
md reassessing pt. Per md pt to be given ativan and to hold on the haldol at the moment. md putting in orders for wrist restraints

## 2012-03-02 NOTE — ED Notes (Signed)
Pt was able to pull soft restraints enough to be able to stand up, rn was given report that when pt gets angry he pees on the floor. Pt urinated on floor and on bed. Pt now will be placed in hard wrist restraints.
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
## 2012-03-02 NOTE — ED Notes (Addendum)
rn assessed pt and he had urinated all over his bed and clothes. Pt linens and scrubs being changed. Pt will have a brief put on. Charge rn notified. Throughout day rn and staff believed pt was spilling his water on floor. Upon further assessment, and with pts actions, rn assesses pt has been urinating on floor throughout day, even though pt has been shown to the bathroom multiple times.

## 2012-03-02 NOTE — ED Notes (Signed)
Breakfast tray received.

## 2012-03-02 NOTE — ED Notes (Addendum)
Pt was sitting on edge, pt feet were tangled up in the blanket, pt tried to stand up and fell forward and twisted to the side and landed on his bottem. Pt denies injuries. md alerted.  Charge rn alerted.  Video was rewound and reviewed and pt did fall.  Pt estimated fall between 1455-1500
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
## 2012-03-02 NOTE — ED Notes (Signed)
Lab bedside.

## 2012-03-02 NOTE — ED Notes (Addendum)
Pt taken out of restraints to eat dinner and ambulated to the bathroom. After meal pt will be placed back into restraints. Pt alerted that he has 30 minutes to eat his food. Pt sitting in bed with side rails up and tray across bed with dinner.

## 2012-03-02 NOTE — ED Provider Notes (Signed)
Pt more somnolent but still appears to be at risk for harming himself and interfering with other patients in the psych ED.  Will continue restraints.  No signs of injury associated with them.  Celene Kras, MD 03/02/12 2226

## 2012-03-03 LAB — URINE MICROSCOPIC-ADD ON

## 2012-03-03 LAB — URINALYSIS, ROUTINE W REFLEX MICROSCOPIC
Bilirubin Urine: NEGATIVE
Glucose, UA: NEGATIVE mg/dL
Hgb urine dipstick: NEGATIVE
Protein, ur: 100 mg/dL — AB
Urobilinogen, UA: 1 mg/dL (ref 0.0–1.0)

## 2012-03-03 NOTE — ED Notes (Signed)
Daryl Campbell called from [place] pt needs a u/a and ekg for his admission DR Lynelle Doctor aware

## 2012-03-03 NOTE — [facility] Counselor (Signed)
Turned down by Rosey Bath at [facility] 03-02-12 at [phone]. Referred to [facility]. Per,  Coralee North 03/03/12 @ 2:04pm patient is on the waitlist.   Pt also referred to [facility]. Patient accepted to [facility] by Dr. [name]. Pending transport via sheriff in the morning. (Nurse will need to call and make arrangements tonight or first thing in the morning).

## 2012-03-03 NOTE — ED Notes (Signed)
Up to chair for dinner ate 100% Requested to use the bathroom void pt remains calm and cooperative

## 2012-03-03 NOTE — ED Notes (Signed)
Restraints released pt is clam and cooperative orientedx4

## 2012-03-03 NOTE — ED Notes (Signed)
Pt has been in room asleep. Awakened for VS and meds, escorted to bathroom d/t gait a little unsteady, difficult to engage in conversation d/t disorganized thought process, cooperative with med admin/VS

## 2012-03-04 NOTE — [facility] Counselor (Signed)
Per shift report, patient pending [facility] and [facility]. *Per Shayla, no beds at [facility]. She says to check back this evening. *Per Okey Regal, their are male beds available. Discussed with patient his options. Patient agreeable to treatment at [facility] and also has money to for transportation via bus and train. Writer started the process of completing the [facility] referral by completing the appropriate referral form. Patients nurse approached this Clinical research associate stating. "Patient would like to speak with you". Writer went to speak with patient. He sts, "You know I think I would rather just go home. I thought maybe I could just go to [facility] and since I can't I would like to just go home". Writer asked patient again, "Are you sure because [facility] does have a bed available". Pt declined offer again. Discussed with EDP (Dr. Hyman Hopes) and she was agreeable to discharge patient home with referrals. Patient will be provided with referrals prior to discharge from [facility].

## 2012-03-04 NOTE — ED Provider Notes (Signed)
BP 98/64  Pulse 58  Temp 97.8 F (36.6 C) (Oral)  Resp 18  SpO2 99%   Pt currently being transferred to [facility]. No complaints at this time. Hypotension noted in chart-- pt sleeping and after sedative medications per staff. Repeat SBP prior to discharge 108 with patient alert. Transfer as planned.   Forbes Cellar, MD 03/04/12 [phone]

## 2012-04-01 ENCOUNTER — Emergency Department (HOSPITAL_COMMUNITY)
Admission: EM | Admit: 2012-04-01 | Discharge: 2012-04-01 | Disposition: A | Discharge status: Home / Self Care | Payer: Medicare Other | Attending: Emergency Medicine | Admitting: Emergency Medicine

## 2012-04-01 ENCOUNTER — Emergency Department (HOSPITAL_COMMUNITY): Payer: Medicare Other

## 2012-04-01 ENCOUNTER — Encounter (HOSPITAL_COMMUNITY): Payer: Self-pay

## 2012-04-01 DIAGNOSIS — W268XXA Contact with other sharp object(s), not elsewhere classified, initial encounter: Secondary | ICD-10-CM | POA: Insufficient documentation

## 2012-04-01 DIAGNOSIS — F209 Schizophrenia, unspecified: Secondary | ICD-10-CM | POA: Insufficient documentation

## 2012-04-01 DIAGNOSIS — I10 Essential (primary) hypertension: Secondary | ICD-10-CM | POA: Insufficient documentation

## 2012-04-01 DIAGNOSIS — Y998 Other external cause status: Secondary | ICD-10-CM | POA: Insufficient documentation

## 2012-04-01 DIAGNOSIS — S61419A Laceration without foreign body of unspecified hand, initial encounter: Secondary | ICD-10-CM

## 2012-04-01 DIAGNOSIS — S61409A Unspecified open wound of unspecified hand, initial encounter: Secondary | ICD-10-CM | POA: Insufficient documentation

## 2012-04-01 DIAGNOSIS — Y9389 Activity, other specified: Secondary | ICD-10-CM | POA: Insufficient documentation

## 2012-04-01 DIAGNOSIS — F17200 Nicotine dependence, unspecified, uncomplicated: Secondary | ICD-10-CM | POA: Insufficient documentation

## 2012-04-01 DIAGNOSIS — B1920 Unspecified viral hepatitis C without hepatic coma: Secondary | ICD-10-CM | POA: Insufficient documentation

## 2012-04-01 DIAGNOSIS — F319 Bipolar disorder, unspecified: Secondary | ICD-10-CM | POA: Insufficient documentation

## 2012-04-01 NOTE — ED Notes (Signed)
Pt sts his tetanus shot is up to date
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
## 2012-04-01 NOTE — ED Notes (Signed)
Contact info for pt Daryl Campbell [phone]

## 2012-04-02 ENCOUNTER — Encounter (HOSPITAL_COMMUNITY): Payer: Self-pay | Admitting: *Deleted

## 2012-04-02 DIAGNOSIS — F17200 Nicotine dependence, unspecified, uncomplicated: Secondary | ICD-10-CM | POA: Insufficient documentation

## 2012-04-02 DIAGNOSIS — I10 Essential (primary) hypertension: Secondary | ICD-10-CM | POA: Insufficient documentation

## 2012-04-02 DIAGNOSIS — F319 Bipolar disorder, unspecified: Secondary | ICD-10-CM | POA: Insufficient documentation

## 2012-04-02 DIAGNOSIS — B1920 Unspecified viral hepatitis C without hepatic coma: Secondary | ICD-10-CM | POA: Insufficient documentation

## 2012-04-02 DIAGNOSIS — F209 Schizophrenia, unspecified: Secondary | ICD-10-CM | POA: Insufficient documentation

## 2012-04-02 NOTE — ED Notes (Signed)
Pt was seen at [facility] today for a R hand laceration.  He came back tonight b/c he said he is hearing voices "God talking in a real loud voice" and he wants them to stop.  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  PT states he was given his 30 day notice from [facility] and he will have to leave.

## 2012-04-03 ENCOUNTER — Emergency Department (HOSPITAL_COMMUNITY)
Admission: EM | Admit: 2012-04-03 | Discharge: 2012-04-03 | Disposition: A | Discharge status: Home / Self Care | Payer: Medicare Other | Attending: Emergency Medicine | Admitting: Emergency Medicine

## 2012-04-03 ENCOUNTER — Other Ambulatory Visit: Payer: Self-pay

## 2012-04-03 DIAGNOSIS — R440 Auditory hallucinations: Secondary | ICD-10-CM

## 2012-04-03 LAB — URINALYSIS, ROUTINE W REFLEX MICROSCOPIC
Glucose, UA: NEGATIVE mg/dL
Leukocytes, UA: NEGATIVE
Nitrite: NEGATIVE
pH: 5.5 (ref 5.0–8.0)

## 2012-04-03 LAB — CBC WITH DIFFERENTIAL/PLATELET
Basophils Relative: 0 % (ref 0–1)
HCT: 41.2 % (ref 39.0–52.0)
Hemoglobin: 14.1 g/dL (ref 13.0–17.0)
Lymphocytes Relative: 33 % (ref 12–46)
Lymphs Abs: 3 10*3/uL (ref 0.7–4.0)
MCHC: 34.2 g/dL (ref 30.0–36.0)
Monocytes Absolute: 0.9 10*3/uL (ref 0.1–1.0)
Monocytes Relative: 10 % (ref 3–12)
Neutro Abs: 5.1 10*3/uL (ref 1.7–7.7)
RBC: 4.48 MIL/uL (ref 4.22–5.81)

## 2012-04-03 LAB — ETHANOL: Alcohol, Ethyl (B): <11 mg/dL (ref 0–11)

## 2012-04-03 LAB — COMPREHENSIVE METABOLIC PANEL
BUN: 17 mg/dL (ref 6–23)
CO2: 28 mEq/L (ref 19–32)
Chloride: 106 mEq/L (ref 96–112)
Creatinine, Ser: 1.69 mg/dL — ABNORMAL HIGH (ref 0.50–1.35)
GFR calc Af Amer: 49 mL/min — ABNORMAL LOW (ref >90)
GFR calc non Af Amer: 42 mL/min — ABNORMAL LOW (ref >90)
Glucose, Bld: 103 mg/dL — ABNORMAL HIGH (ref 70–99)
Total Bilirubin: 0.8 mg/dL (ref 0.3–1.2)

## 2012-04-03 LAB — ACETAMINOPHEN LEVEL: Acetaminophen (Tylenol), Serum: <15 ug/mL (ref 10–30)

## 2012-04-03 LAB — URINE MICROSCOPIC-ADD ON

## 2012-04-03 LAB — RAPID URINE DRUG SCREEN, HOSP PERFORMED: Barbiturates: NOT DETECTED

## 2012-04-03 LAB — LITHIUM LEVEL: Lithium Lvl: 0.26 mEq/L — ABNORMAL LOW (ref 0.80–1.40)

## 2012-04-03 MED ORDER — SODIUM CHLORIDE 0.9 % IV BOLUS (SEPSIS)
1000.0000 mL | Freq: Once | INTRAVENOUS | Status: AC
  Administered 2012-04-03: 1000 mL via INTRAVENOUS

## 2012-04-03 NOTE — ED Provider Notes (Addendum)
History     CSN: [id number]  Arrival date & time 04/02/12  2042   First MD Initiated Contact with Patient 04/03/12 0112      Chief Complaint  Patient presents with  . Hallucinations     HPI The patient presents with a request for assistance with his auditory hallucinations.  Notably, the patient was seen earlier today for a hand laceration.  He notes that during that presentation, and 4 the past several days to a week he is an increasingly present auditory hallucinations, which are interfering with his capacity to live his normal life.  He notes the voices instruct him  to do things, yell, at normally, but do not tell him to hurt others or to hurt himself. He has no new complaints but the hand.  He states that he was recently living in a transitional facility, but is now searching for a new living situation. Past Medical History  Diagnosis Date  . Hypertension   . Psychiatric disorder   . Hepatitis C   . Seizures   . Schizophrenia   . Bipolar affective     History reviewed. No pertinent past surgical history.  No family history on file.  History  Substance Use Topics  . Smoking status: Current Everyday Smoker -- 0.5 packs/day for 3 years    Types: Cigarettes  . Smokeless tobacco: Never Used  . Alcohol Use: No      Review of Systems  All other systems reviewed and are negative.    Allergies  Review of patient's allergies indicates no known allergies.  Home Medications   Current Outpatient Rx  Name Route Sig Dispense Refill  . ASPIRIN EC 81 MG PO TBEC Oral Take 81 mg by mouth daily.    Marland Kitchen BENZTROPINE MESYLATE 1 MG PO TABS Oral Take 1 mg by mouth 2 (two) times daily.    Marland Kitchen VITAMIN D 1000 UNITS PO TABS Oral Take 1,000 Units by mouth daily.    Marland Kitchen DOXYCYCLINE HYCLATE 100 MG PO CPEP Oral Take 100 mg by mouth 2 (two) times daily.    Marland Kitchen HALOPERIDOL 5 MG PO TABS Oral Take 5 mg by mouth at bedtime.    Marland Kitchen LEVETIRACETAM 500 MG PO TABS Oral Take 500 mg by mouth 2 (two) times  daily.    Marland Kitchen LITHIUM CARBONATE PO Oral Take 200 mg by mouth 2 (two) times daily. 1 tablet in the morning and 2 at bedtime    . THERA M PLUS PO TABS Oral Take 1 tablet by mouth daily.    Marland Kitchen POLYETHYLENE GLYCOL 3350 PO PACK Oral Take 17 g by mouth daily.    . SERTRALINE HCL 100 MG PO TABS Oral Take 100 mg by mouth daily.    . THIAMINE HCL 100 MG PO TABS Oral Take 100 mg by mouth daily.      BP 107/59  Pulse 58  Temp 97.3 F (36.3 C) (Oral)  Resp 20  SpO2 100%  Physical Exam  Nursing note and vitals reviewed. Constitutional: He appears well-developed and well-nourished. No distress.  HENT:  Head: Normocephalic and atraumatic.  Eyes: Conjunctivae are normal. Right eye exhibits no discharge. Left eye exhibits no discharge.  Neck: Neck supple.  Cardiovascular: Normal rate, regular rhythm and normal heart sounds.  Exam reveals no gallop and no friction rub.   No murmur heard. Pulmonary/Chest: Effort normal and breath sounds normal. No respiratory distress.  Abdominal: Soft. He exhibits no distension. There is no tenderness.  Musculoskeletal: He exhibits  no edema and no tenderness.       Multiple sutures in place on the posterior of the right hand.  Mild surrounding edema, no spreading erythema.  Neurological: He is alert.  Skin: Skin is warm and dry.  Psychiatric: Judgment normal. His mood appears anxious. His speech is tangential. He is withdrawn and actively hallucinating. Thought content is delusional. Thought content is not paranoid. Cognition and memory are impaired. He expresses no homicidal and no suicidal ideation. He expresses no suicidal plans and no homicidal plans.    ED Course  Procedures (including critical care time)  Labs Reviewed  COMPREHENSIVE METABOLIC PANEL - Abnormal; Notable for the following:    Glucose, Bld 103 (*)     Creatinine, Ser 1.69 (*)     Total Protein 8.5 (*)     AST 39 (*)     GFR calc non Af Amer 42 (*)     GFR calc Af Amer 49 (*)     All other  components within normal limits  URINE RAPID DRUG SCREEN (HOSP PERFORMED) - Abnormal; Notable for the following:    Benzodiazepines POSITIVE (*)     All other components within normal limits  URINALYSIS, ROUTINE W REFLEX MICROSCOPIC - Abnormal; Notable for the following:    Color, Urine AMBER (*)  BIOCHEMICALS MAY BE AFFECTED BY COLOR   APPearance CLOUDY (*)     Bilirubin Urine SMALL (*)     Ketones, ur 15 (*)     Protein, ur 100 (*)     All other components within normal limits  URINE MICROSCOPIC-ADD ON - Abnormal; Notable for the following:    Casts HYALINE CASTS (*)  GRANULAR CAST   Crystals CA OXALATE CRYSTALS (*)     All other components within normal limits  CBC WITH DIFFERENTIAL  ETHANOL  ACETAMINOPHEN LEVEL   Dg Hand Complete Right  04/01/2012  *RADIOLOGY REPORT*  Clinical Data: Altercation, laceration.  RIGHT HAND - COMPLETE 3+ VIEW  Comparison: None.  Findings: There is soft tissue swelling over the dorsal aspect of the hand.  No underlying fracture.  Linear metallic foreign bodies are seen along the dorsal soft tissues overlying the fourth middle phalanx. Minimal degenerative change at the scaphoid trapezium trapezoid joint.  IMPRESSION:  1.  Dorsal soft tissue swelling without acute osseous abnormality. 2.  Radiopaque linear metallic foreign bodies in the dorsal soft tissues overlying the fourth middle phalanx, age indeterminate.  Original Report Authenticated By: Reyes Ivan, M.D.     No diagnosis found.    MDM  This patient with schizophrenia, recent hand laceration, now presents with concerns of worsening auditory hallucinations, and an inability to function.  On my exam the patient is in no distress.  The patient's labs notable for demonstrating of renal dysfunction, but otherwise reassuring.  The patient was resuscitated with IV fluids, and absent other acute findings, complaints, he was medically clear for psychiatric evaluation.  Toxic was ordered, and it was  pending on my shift change  (Lithium level was pending)     Gerhard Munch, MD 04/03/12 1610  Gerhard Munch, MD 04/03/12   6:00 AM Dr. Diona Browner recommends d/c w no new meds.    Gerhard Munch, MD 04/03/12 [phone]

## 2012-04-03 NOTE — ED Notes (Signed)
[facility] CONFERENCE WITH PSYCHIATRIST IN PROGRESS.

## 2012-04-03 NOTE — ED Notes (Signed)
Pt give water.  Pt to give urine sample.  States minimal pain to R hand.

## 2012-04-03 NOTE — ED Notes (Signed)
Patient is still attempting to void. Will continue to monitor

## 2012-04-03 NOTE — ED Notes (Signed)
BUS PASS GIVEN TO PT. PT, STATES HE WILL GO HOME AFTER EATING HIS BREAKFAST.

## 2012-04-03 NOTE — ED Notes (Signed)
ACT at bedside 

## 2012-04-03 NOTE — ED Notes (Signed)
Patient unable to void. Will in and out cath
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
## 2012-04-03 NOTE — ED Notes (Signed)
MONITOR FOR TELEPSYCH SET UP AT BEDSIDE.

## 2012-04-03 NOTE — ED Notes (Signed)
Patient informed that we need a urine sample. Urinal provided.

## 2012-05-12 ENCOUNTER — Emergency Department (HOSPITAL_COMMUNITY): Payer: Medicare Other

## 2012-05-12 ENCOUNTER — Emergency Department (HOSPITAL_COMMUNITY)
Admission: EM | Admit: 2012-05-12 | Discharge: 2012-05-13 | Disposition: A | Discharge status: Home / Self Care | Payer: Medicare Other | Attending: Emergency Medicine | Admitting: Emergency Medicine

## 2012-05-12 ENCOUNTER — Encounter (HOSPITAL_COMMUNITY): Payer: Self-pay | Admitting: *Deleted

## 2012-05-12 DIAGNOSIS — R4182 Altered mental status, unspecified: Secondary | ICD-10-CM | POA: Insufficient documentation

## 2012-05-12 DIAGNOSIS — J449 Chronic obstructive pulmonary disease, unspecified: Secondary | ICD-10-CM | POA: Insufficient documentation

## 2012-05-12 DIAGNOSIS — F209 Schizophrenia, unspecified: Secondary | ICD-10-CM

## 2012-05-12 DIAGNOSIS — J4489 Other specified chronic obstructive pulmonary disease: Secondary | ICD-10-CM | POA: Insufficient documentation

## 2012-05-12 DIAGNOSIS — R55 Syncope and collapse: Secondary | ICD-10-CM | POA: Insufficient documentation

## 2012-05-12 LAB — URINALYSIS, ROUTINE W REFLEX MICROSCOPIC
Bilirubin Urine: NEGATIVE
Hgb urine dipstick: NEGATIVE
Protein, ur: NEGATIVE mg/dL
Urobilinogen, UA: 1 mg/dL (ref 0.0–1.0)

## 2012-05-12 LAB — COMPREHENSIVE METABOLIC PANEL
ALT: 49 U/L (ref 0–53)
AST: 46 U/L — ABNORMAL HIGH (ref 0–37)
CO2: 22 mEq/L (ref 19–32)
Calcium: 10.2 mg/dL (ref 8.4–10.5)
GFR calc non Af Amer: 58 mL/min — ABNORMAL LOW (ref >90)
Sodium: 135 mEq/L (ref 135–145)

## 2012-05-12 LAB — CK: Total CK: 83 U/L (ref 7–232)

## 2012-05-12 LAB — CBC WITH DIFFERENTIAL/PLATELET
Basophils Absolute: 0 10*3/uL (ref 0.0–0.1)
Eosinophils Relative: 1 % (ref 0–5)
Lymphocytes Relative: 11 % — ABNORMAL LOW (ref 12–46)
MCV: 89.6 fL (ref 78.0–100.0)
Neutro Abs: 8 10*3/uL — ABNORMAL HIGH (ref 1.7–7.7)
Neutrophils Relative %: 81 % — ABNORMAL HIGH (ref 43–77)
Platelets: 194 10*3/uL (ref 150–400)
RDW: 12.2 % (ref 11.5–15.5)
WBC: 9.9 10*3/uL (ref 4.0–10.5)

## 2012-05-12 LAB — RAPID URINE DRUG SCREEN, HOSP PERFORMED
Amphetamines: NOT DETECTED
Cocaine: NOT DETECTED
Opiates: NOT DETECTED
Tetrahydrocannabinol: NOT DETECTED

## 2012-05-12 LAB — ETHANOL: Alcohol, Ethyl (B): <11 mg/dL (ref 0–11)

## 2012-05-12 LAB — ACETAMINOPHEN LEVEL: Acetaminophen (Tylenol), Serum: <15 ug/mL (ref 10–30)

## 2012-05-12 LAB — TROPONIN I: Troponin I: <0.3 ng/mL (ref <0.30)

## 2012-05-12 MED ORDER — SODIUM CHLORIDE 0.9 % IV BOLUS (SEPSIS)
1000.0000 mL | Freq: Once | INTRAVENOUS | Status: AC
  Administered 2012-05-12: 1000 mL via INTRAVENOUS

## 2012-05-12 NOTE — ED Notes (Signed)
Gave pt some orange juice. He will try to give a urine sample when he is done.

## 2012-05-12 NOTE — ED Provider Notes (Signed)
History     CSN: [id number]  Arrival date & time 05/12/12  1843   First MD Initiated Contact with Patient 05/12/12 1901      Chief Complaint  Patient presents with  . Loss of Consciousness    (Consider location/radiation/quality/duration/timing/severity/associated sxs/prior treatment) HPI Comments: Patient brought in by EMS after being found on the side of the road. He is oriented to himself, where he is and day of the week. He denies any pain or trauma. He states he was walking down the side of the road and someone he thought he was unsteady so they called the ambulance. He denies hitting his head or losing consciousness. He denies any alcohol or drug use. EMS found him to be hypoxic and is oriented.  The history is provided by the patient.    Past Medical History  Diagnosis Date  . Hypertension   . Psychiatric disorder   . Hepatitis C   . Seizures   . Schizophrenia   . Bipolar affective     History reviewed. No pertinent past surgical history.  History reviewed. No pertinent family history.  History  Substance Use Topics  . Smoking status: Current Everyday Smoker -- 0.5 packs/day for 3 years    Types: Cigarettes  . Smokeless tobacco: Never Used  . Alcohol Use: No      Review of Systems  Constitutional: Negative for fever, activity change and appetite change.  HENT: Negative for congestion and rhinorrhea.   Respiratory: Negative for cough, chest tightness and shortness of breath.   Cardiovascular: Positive for syncope.  Gastrointestinal: Negative for nausea, vomiting and abdominal pain.  Genitourinary: Negative for dysuria.  Musculoskeletal: Negative for back pain.  Skin: Negative for rash.  Neurological: Negative for dizziness, weakness and headaches.    Allergies  Review of patient's allergies indicates no known allergies.  Home Medications   Current Outpatient Rx  Name Route Sig Dispense Refill  . ASPIRIN EC 81 MG PO TBEC Oral Take 81 mg by mouth  daily.    Marland Kitchen BENZTROPINE MESYLATE 1 MG PO TABS Oral Take 1 mg by mouth 2 (two) times daily.    Marland Kitchen VITAMIN D 1000 UNITS PO TABS Oral Take 1,000 Units by mouth daily.    Marland Kitchen DOXYCYCLINE HYCLATE 100 MG PO CPEP Oral Take 100 mg by mouth 2 (two) times daily.    Marland Kitchen HALOPERIDOL 5 MG PO TABS Oral Take 5 mg by mouth at bedtime.    Marland Kitchen LEVETIRACETAM 500 MG PO TABS Oral Take 500 mg by mouth 2 (two) times daily.    Marland Kitchen LITHIUM CARBONATE PO Oral Take 200 mg by mouth 2 (two) times daily. 1 tablet in the morning and 2 at bedtime    . THERA M PLUS PO TABS Oral Take 1 tablet by mouth daily.    Marland Kitchen POLYETHYLENE GLYCOL 3350 PO PACK Oral Take 17 g by mouth daily.    . SERTRALINE HCL 100 MG PO TABS Oral Take 100 mg by mouth daily.    . THIAMINE HCL 100 MG PO TABS Oral Take 100 mg by mouth daily.      BP 122/82  Pulse 80  Temp 99.5 F (37.5 C) (Rectal)  Resp 17  SpO2 95%  Physical Exam  Constitutional: He is oriented to person, place, and time. He appears well-developed and well-nourished. No distress.       Feels warm  HENT:  Head: Normocephalic and atraumatic.  Mouth/Throat: Oropharynx is clear and moist.  Dry mucous membrane  Eyes: Conjunctivae and EOM are normal. Pupils are equal, round, and reactive to light.  Neck: Normal range of motion. Neck supple.       No meningismus  Cardiovascular: Normal rate, regular rhythm and normal heart sounds.   No murmur heard. Pulmonary/Chest: Effort normal and breath sounds normal. No respiratory distress.       Decreased breath sounds on left  Abdominal: Soft. There is no tenderness. There is no rebound.  Musculoskeletal: Normal range of motion. He exhibits no edema and no tenderness.  Neurological: He is alert and oriented to person, place, and time. No cranial nerve deficit.       Cranial nerves 3-12 intact, 5 of 5 strength throughout, no clonus, no ataxia  Skin: Skin is warm.    ED Course  Procedures (including critical care time)  Labs Reviewed  CBC WITH  DIFFERENTIAL - Abnormal; Notable for the following:    RBC 4.15 (*)     HCT 37.2 (*)     Neutrophils Relative 81 (*)     Neutro Abs 8.0 (*)     Lymphocytes Relative 11 (*)     All other components within normal limits  COMPREHENSIVE METABOLIC PANEL - Abnormal; Notable for the following:    Potassium 3.3 (*)     AST 46 (*)     GFR calc non Af Amer 58 (*)     GFR calc Af Amer 68 (*)     All other components within normal limits  URINALYSIS, ROUTINE W REFLEX MICROSCOPIC - Abnormal; Notable for the following:    Ketones, ur 15 (*)     All other components within normal limits  SALICYLATE LEVEL - Abnormal; Notable for the following:    Salicylate Lvl <2.0 (*)     All other components within normal limits  CK  TROPONIN I  PROTIME-INR  URINE RAPID DRUG SCREEN (HOSP PERFORMED)  AMMONIA  LITHIUM LEVEL  GLUCOSE, CAPILLARY  ETHANOL  ACETAMINOPHEN LEVEL   Dg Chest 2 View  05/12/2012  *RADIOLOGY REPORT*  Clinical Data: Altered mental status  CHEST - 2 VIEW  Comparison: Chest x-ray of 02/02/2012  Findings: No active infiltrate or effusion is seen.  Chronic changes are noted bilaterally.  The lungs remain hyperaerated most consistent with COPD.  The heart is within normal limits in size. Degenerative changes are noted throughout the thoracic spine.  IMPRESSION:   Stable chronic change and COPD.  No active lung disease.   Original Report Authenticated By: Juline Patch, M.D.    Ct Head Wo Contrast  05/12/2012  *RADIOLOGY REPORT*  Clinical Data: Altered mental status.  Possible trauma.  CT HEAD WITHOUT CONTRAST  Technique:  Contiguous axial images were obtained from the base of the skull through the vertex without contrast.  Comparison: 01/04/2012.  Findings: Stable minimally enlarged ventricles and subarachnoid spaces.  No skull fracture, intracranial hemorrhage, mass lesion, CT evidence of acute infarction or paranasal sinus air-fluid levels.  IMPRESSION: No acute abnormality.  Stable minimal  atrophy.   Original Report Authenticated By: Darrol Angel, M.D.      No diagnosis found.    MDM  Found outside, no evidence of trauma. Patient alert and oriented to person, place and time. Denies any trauma. Denies any pain.  Borderline pulse ox initially. Chronic COPD changes on CXR. Blood work unremarkable, lithium level therapeutic  Toxicology labs unremarkable. CT head stable. Vital signs stable oxygen saturation appears to be at baseline. No hypoxia or  increased work of breathing with ambulation. No tachycardia.  Patient appears to be at his baseline has no complaints. He is alert and oriented. Will discharge back to nursing home.    Date: 05/12/2012  Rate: 95  Rhythm: normal sinus rhythm  QRS Axis: normal  Intervals: normal  ST/T Wave abnormalities: normal  Conduction Disutrbances:none  Narrative Interpretation: Anterior T waves now upright  Old EKG Reviewed: unchanged    Glynn Octave, MD 05/12/12 2352

## 2012-05-12 NOTE — ED Notes (Addendum)
Per EMS- pt was found on side of the road. Pt oriented to self only with EMS. SPO2 90% on room air. Lives at [facility] nursing center. [phone]. IV 18 Left hand. CBG 129. BP162/100. Pulse 104.

## 2012-05-12 NOTE — ED Notes (Signed)
Pt has a urinal and knows we need a urine sample but is unable to go at this time

## 2012-05-12 NOTE — ED Notes (Signed)
Walked pt around [employer] A, pt tolerated and did not become dizzy. Pt is trying to give [place] a urine sample

## 2012-05-12 NOTE — ED Notes (Signed)
CBG: 80 

## 2012-05-24 ENCOUNTER — Encounter (HOSPITAL_COMMUNITY): Payer: Self-pay | Admitting: *Deleted

## 2012-05-24 ENCOUNTER — Emergency Department (HOSPITAL_COMMUNITY): Payer: Medicare Other

## 2012-05-24 ENCOUNTER — Emergency Department (HOSPITAL_COMMUNITY)
Admission: EM | Admit: 2012-05-24 | Discharge: 2012-05-24 | Disposition: A | Discharge status: Home / Self Care | Payer: Medicare Other | Attending: Emergency Medicine | Admitting: Emergency Medicine

## 2012-05-24 DIAGNOSIS — I10 Essential (primary) hypertension: Secondary | ICD-10-CM | POA: Insufficient documentation

## 2012-05-24 DIAGNOSIS — Z79899 Other long term (current) drug therapy: Secondary | ICD-10-CM | POA: Insufficient documentation

## 2012-05-24 DIAGNOSIS — X58XXXA Exposure to other specified factors, initial encounter: Secondary | ICD-10-CM | POA: Insufficient documentation

## 2012-05-24 DIAGNOSIS — S91209A Unspecified open wound of unspecified toe(s) with damage to nail, initial encounter: Secondary | ICD-10-CM

## 2012-05-24 DIAGNOSIS — S91109A Unspecified open wound of unspecified toe(s) without damage to nail, initial encounter: Secondary | ICD-10-CM | POA: Insufficient documentation

## 2012-05-24 NOTE — ED Notes (Signed)
Patient with complaints of right ankle pain and toe pain.  His toenail came off of the right great toe

## 2012-05-24 NOTE — Progress Notes (Signed)
Orthopedic Tech Progress Note Patient Details:  Daryl Campbell 30-Dec-1950 [id number]  Ortho Devices Type of Ortho Device: Postop boot Ortho Device/Splint Location: right foot Ortho Device/Splint Interventions: Application   Nikki Dom 05/24/2012, 2:48 PM

## 2012-05-24 NOTE — ED Notes (Signed)
Paged ortho 

## 2012-05-24 NOTE — ED Provider Notes (Signed)
History  This chart was scribed for Cyndra Numbers, MD by Shari Heritage. The patient was seen in room TR09C/TR09C. Patient's care was started at 1355.     CSN: [id number]  Arrival date & time 05/24/12  1217   First MD Initiated Contact with Patient 05/24/12 1355      Chief Complaint  Patient presents with  . Foot Pain  . Toe Pain     The history is provided by the patient. No language interpreter was used.    Daryl Campbell is a 61 y.o. male from a group home who presents to the Emergency Department complaining of mild to moderate, constant right great toe pain and onset 12 hours ago (at 2:00am this morning).  A portion of his right great toenail is missing. Patient doesn't know how he injured his foot. He denies any other injuries or symptoms at this time. He denies hallucinations. Patient has a history of HTN, psychiatric disorder, Hepatis C, seizures, schizophrenia and bipolar affective. Patient is a poor historian. Patient states that he has Medicaid coverage.    Past Medical History  Diagnosis Date  . Hypertension   . Psychiatric disorder   . Hepatitis C   . Seizures   . Schizophrenia   . Bipolar affective     History reviewed. No pertinent past surgical history.  History reviewed. No pertinent family history.  History  Substance Use Topics  . Smoking status: Current Every Day Smoker -- 0.5 packs/day for 3 years    Types: Cigarettes  . Smokeless tobacco: Never Used  . Alcohol Use: No      Review of Systems  Constitutional: Negative.   HENT: Negative.   Eyes: Negative.   Respiratory: Negative.   Cardiovascular: Negative.   Gastrointestinal: Negative.   Genitourinary: Negative.   Musculoskeletal: Negative.        Positive for toe pain.  Skin: Positive for wound.  Neurological: Negative.     Allergies  Review of patient's allergies indicates no known allergies.  Home Medications   Current Outpatient Rx  Name Route Sig Dispense Refill  . ASPIRIN EC 81  MG PO TBEC Oral Take 81 mg by mouth daily.    Marland Kitchen BENZTROPINE MESYLATE 1 MG PO TABS Oral Take 1 mg by mouth 2 (two) times daily.    Marland Kitchen VITAMIN D 1000 UNITS PO TABS Oral Take 1,000 Units by mouth daily.    Marland Kitchen DOXYCYCLINE HYCLATE 100 MG PO CPEP Oral Take 100 mg by mouth 2 (two) times daily.    Marland Kitchen HALOPERIDOL 5 MG PO TABS Oral Take 5 mg by mouth at bedtime.    Marland Kitchen LEVETIRACETAM 500 MG PO TABS Oral Take 500 mg by mouth 2 (two) times daily.    Marland Kitchen LITHIUM CARBONATE PO Oral Take 200 mg by mouth 2 (two) times daily. 1 tablet in the morning and 2 at bedtime    . THERA M PLUS PO TABS Oral Take 1 tablet by mouth daily.    Marland Kitchen POLYETHYLENE GLYCOL 3350 PO PACK Oral Take 17 g by mouth daily.    . SERTRALINE HCL 100 MG PO TABS Oral Take 100 mg by mouth daily.    . THIAMINE HCL 100 MG PO TABS Oral Take 100 mg by mouth daily.      BP 102/65  Pulse 63  Temp 98.8 F (37.1 C) (Oral)  Resp 16  SpO2 94%  Physical Exam  Nursing note and vitals reviewed.  GEN: Well-developed, well-nourished male in no acute distress  HEENT: Atraumatic, normocephalic. CV: regular rate and rhythm. +2 DP and PT pulses. PULM: No respiratory distress. GU: deferred Neuro: No abnormalities of strength or sensation, A and O x 3. MSK: Patient moves all 4 extremities symmetrically. No deformities. No significant tenderness to palpation. Skin: No rashes petechiae, purpura, or jaundice. Right great toe nail avulsion injury. Nail is present, but slightly avulsed. Slight erythema surrounding the nail consistent with injury but does not appear to be cellulitic. No induration. No fluctuance. Psych: no abnormality of mood  ED Course  Procedures (including critical care time) DIAGNOSTIC STUDIES: Oxygen Saturation is 94% on room air, adequate by my interpretation.    COORDINATION OF CARE: 1:59pm- Patient informed of current plan for treatment and evaluation and agrees with plan at this time.    Labs Reviewed - No data to display  Dg Ankle  Complete Right  05/24/2012  *RADIOLOGY REPORT*  Clinical Data: Foot pain, toe pain  RIGHT ANKLE - COMPLETE 3+ VIEW  Comparison: None.  Findings: Three views of the right ankle submitted.  No acute fracture or subluxation.  Ankle mortise is preserved.  IMPRESSION: No acute fracture or subluxation.   Original Report Authenticated By: Natasha Mead, M.D.    Dg Foot Complete Right  05/24/2012  *RADIOLOGY REPORT*  Clinical Data: Right great toe pain  RIGHT FOOT COMPLETE - 3+ VIEW  Comparison: None.  Findings: Three views of the right foot submitted.  No acute fracture or subluxation.  Mild degenerative changes noted first metatarsal phalangeal joint.  IMPRESSION: No acute fracture or subluxation.  Mild degenerative changes first metatarsal phalangeal joint.   Original Report Authenticated By: Natasha Mead, M.D.      1. Avulsion of toenail       MDM  Patient evaluated by myself and found to have partial toenail avulsion.  Plain film performed and reviewed given patient's poor history and report of foot injury.  This was negative.  Wound care performed and post-op boot provided.  Patient referred to podiatry.  Discharged in good condition.      I personally performed the services described in this documentation, which was scribed in my presence. The recorded information has been reviewed and considered.     Cyndra Numbers, MD 05/24/12 2115

## 2012-05-24 NOTE — ED Notes (Signed)
Patient C/O pain in his right great toe. States that he may have kicked something.  His toenail is missing with a vestage remaining laterally. Toe cleaned.

## 2012-05-26 ENCOUNTER — Emergency Department (HOSPITAL_COMMUNITY)
Admission: EM | Admit: 2012-05-26 | Discharge: 2012-05-27 | Disposition: A | Discharge status: Skilled Nursing Facility | Payer: Medicare Other | Attending: Emergency Medicine | Admitting: Emergency Medicine

## 2012-05-26 ENCOUNTER — Encounter (HOSPITAL_COMMUNITY): Payer: Self-pay | Admitting: Family Medicine

## 2012-05-26 DIAGNOSIS — Y9289 Other specified places as the place of occurrence of the external cause: Secondary | ICD-10-CM | POA: Insufficient documentation

## 2012-05-26 DIAGNOSIS — B1920 Unspecified viral hepatitis C without hepatic coma: Secondary | ICD-10-CM | POA: Insufficient documentation

## 2012-05-26 DIAGNOSIS — F319 Bipolar disorder, unspecified: Secondary | ICD-10-CM | POA: Insufficient documentation

## 2012-05-26 DIAGNOSIS — F17200 Nicotine dependence, unspecified, uncomplicated: Secondary | ICD-10-CM | POA: Insufficient documentation

## 2012-05-26 DIAGNOSIS — X58XXXA Exposure to other specified factors, initial encounter: Secondary | ICD-10-CM | POA: Insufficient documentation

## 2012-05-26 DIAGNOSIS — I10 Essential (primary) hypertension: Secondary | ICD-10-CM | POA: Insufficient documentation

## 2012-05-26 DIAGNOSIS — IMO0002 Reserved for concepts with insufficient information to code with codable children: Secondary | ICD-10-CM | POA: Insufficient documentation

## 2012-05-26 DIAGNOSIS — F209 Schizophrenia, unspecified: Secondary | ICD-10-CM | POA: Insufficient documentation

## 2012-05-26 DIAGNOSIS — F29 Unspecified psychosis not due to a substance or known physiological condition: Secondary | ICD-10-CM

## 2012-05-26 LAB — BASIC METABOLIC PANEL
BUN: 13 mg/dL (ref 6–23)
CO2: 25 mEq/L (ref 19–32)
GFR calc non Af Amer: 74 mL/min — ABNORMAL LOW (ref >90)
Glucose, Bld: 111 mg/dL — ABNORMAL HIGH (ref 70–99)
Potassium: 3.3 mEq/L — ABNORMAL LOW (ref 3.5–5.1)
Sodium: 137 mEq/L (ref 135–145)

## 2012-05-26 LAB — CBC WITH DIFFERENTIAL/PLATELET
Basophils Relative: 0 % (ref 0–1)
Eosinophils Absolute: 0 10*3/uL (ref 0.0–0.7)
Eosinophils Relative: 0 % (ref 0–5)
Hemoglobin: 12.5 g/dL — ABNORMAL LOW (ref 13.0–17.0)
Lymphs Abs: 1.9 10*3/uL (ref 0.7–4.0)
MCH: 31.6 pg (ref 26.0–34.0)
MCHC: 34.7 g/dL (ref 30.0–36.0)
MCV: 90.9 fL (ref 78.0–100.0)
Monocytes Absolute: 0.8 10*3/uL (ref 0.1–1.0)
Monocytes Relative: 7 % (ref 3–12)
Neutrophils Relative %: 75 % (ref 43–77)
RBC: 3.96 MIL/uL — ABNORMAL LOW (ref 4.22–5.81)

## 2012-05-26 LAB — HEPATIC FUNCTION PANEL
Albumin: 3.6 g/dL (ref 3.5–5.2)
Alkaline Phosphatase: 95 U/L (ref 39–117)
Bilirubin, Direct: 0.2 mg/dL (ref 0.0–0.3)
Indirect Bilirubin: 0.8 mg/dL (ref 0.3–0.9)
Total Bilirubin: 1 mg/dL (ref 0.3–1.2)

## 2012-05-26 LAB — SALICYLATE LEVEL: Salicylate Lvl: <2 mg/dL — ABNORMAL LOW (ref 2.8–20.0)

## 2012-05-26 NOTE — ED Notes (Signed)
Patient stated he used the urinal and gave it to some girl.  Unable to find the urinal or the girl.  Instructed he would have to use the urinal again and then given it to me.  Patients thoughts are so jumbled that they are hard to keep up with.

## 2012-05-26 NOTE — ED Notes (Signed)
Per EMS, pt complaining of scratches all over body from walking through woods.

## 2012-05-26 NOTE — ED Notes (Signed)
Patient removed the sheet from the bed prior to getting back into the bed.

## 2012-05-26 NOTE — ED Notes (Signed)
On assessment of pt, scratches noted to bilateral legs, feet and arms. Pt states he is unsure of how he got them. Reports that he is at [facility] but unable to state the day, year, president or why he is here. Pt speech is pressured and sentences not making sense. Pt is discheveled in appearance, and noted to not have any shoes on. Pt give paper scrubs and belongings bagged. Pt unstable on his feet and was assisted by staff with clothing. EDP informed of pt status.

## 2012-05-26 NOTE — ED Provider Notes (Signed)
History  This chart was scribed for Daryl Kaplan, MD by Ardeen Jourdain. This patient was seen in room TR10C/TR10C and the patient's care was started at 1846.  CSN: [id number]  Arrival date & time 05/26/12  1634   First MD Initiated Contact with Patient 05/26/12 1846     Level 5 Caveat- schizophrenia   Chief Complaint  Patient presents with  . scratches      The history is provided by the patient. No language interpreter was used.    Daryl Campbell is a 61 y.o. male brought in by ambulance, who presents to the Emergency Department complaining of scratches on the body due to a fall but as states that [place] the woods and has been crawling around with no know reason. He has a h/o of schizophrenia, and bipolar affective disorder. He admits to not taking his medications. He states he is hearing voices as well as seeing hallucinations. He denies HI or SI. He denies any illicit drug use, but is a current everyday smoker.    Past Medical History  Diagnosis Date  . Hypertension   . Psychiatric disorder   . Hepatitis C   . Seizures   . Schizophrenia   . Bipolar affective     History reviewed. No pertinent past surgical history.  History reviewed. No pertinent family history.  History  Substance Use Topics  . Smoking status: Current Every Day Smoker -- 0.5 packs/day for 3 years    Types: Cigarettes  . Smokeless tobacco: Never Used  . Alcohol Use: No      Review of Systems  Unable to perform ROS: Psychiatric disorder    Allergies  Review of patient's allergies indicates no known allergies.  Home Medications   Current Outpatient Rx  Name Route Sig Dispense Refill  . ASPIRIN EC 81 MG PO TBEC Oral Take 81 mg by mouth daily.    Marland Kitchen BENZTROPINE MESYLATE 1 MG PO TABS Oral Take 1 mg by mouth 2 (two) times daily.    Marland Kitchen VITAMIN D 1000 UNITS PO TABS Oral Take 1,000 Units by mouth daily.    Marland Kitchen HALOPERIDOL 2 MG PO TABS Oral Take 2 mg by mouth 3 (three) times daily.    Marland Kitchen  HALOPERIDOL DECANOATE 50 MG/ML IM SOLN Intramuscular Inject 200 mg into the muscle every 28 (twenty-eight) days.    . IBUPROFEN 200 MG PO TABS Oral Take 600 mg by mouth every 6 (six) hours as needed. For pain    . LEVETIRACETAM 500 MG PO TABS Oral Take 500 mg by mouth 2 (two) times daily.    Marland Kitchen LITHIUM CARBONATE 300 MG PO CAPS Oral Take 300 mg by mouth 3 (three) times daily with meals.    Marland Kitchen LORAZEPAM 2 MG PO TABS Oral Take 2 mg by mouth every 6 (six) hours as needed. For aggitation    . THERA M PLUS PO TABS Oral Take 1 tablet by mouth daily.    Marland Kitchen POLYETHYLENE GLYCOL 3350 PO PACK Oral Take 17 g by mouth daily as needed. For constipation    . SERTRALINE HCL 100 MG PO TABS Oral Take 100 mg by mouth daily.    . THIAMINE HCL 100 MG PO TABS Oral Take 100 mg by mouth daily.      Triage Vitals: BP 127/78  Pulse 85  Temp 98 F (36.7 C)  Resp 18  SpO2 92%  Physical Exam  Nursing note and vitals reviewed. Constitutional: He appears well-developed and well-nourished.  HENT:  Head: Normocephalic and atraumatic.       Dry mucus membranes seen with no lesions   Eyes: Conjunctivae normal and EOM are normal.  Neck: Normal range of motion. Neck supple.  Cardiovascular: Normal rate and regular rhythm.   Pulmonary/Chest: Effort normal and breath sounds normal.  Abdominal: Soft. Bowel sounds are normal.  Musculoskeletal: Normal range of motion.  Neurological:       Alert to himself, oriented to self and place  Skin: Skin is warm and dry. Rash noted.       erythematous lesionson abdomen, erythematous lesions on forearms, showing signs on healing, blitaeral lower extremety lesions similar to described, 2+ dorsalias pedis bilaterally     ED Course  Procedures (including critical care time)  DIAGNOSTIC STUDIES: Oxygen Saturation is 92% on room air, adequate by my interpretation.    COORDINATION OF CARE:  1945- Plan to follow up with ACT team to discuss further treatment.     Labs Reviewed - No  data to display No results found.   No diagnosis found.    MDM   Date: 05/26/2012  Rate: 69  Rhythm: normal sinus rhythm  QRS Axis: normal  Intervals: normal  ST/T Wave abnormalities: nonspecific ST/T changes  Conduction Disutrbances:none  Narrative Interpretation:   Old EKG Reviewed: unchanged  DDx: Depression Bipolar disorder Schizophrenia Substance abuse Suicidal ideation Acute withdrawal  Pt comes in with cc of scratches. He has diffuse scratches - states that he has been crawling in the woods. No infections. He is aox2. Not a great historian, states he has auditory and visual hallucinations, no SI/HI.  Will consult ACT and get psych labs. No concern for lithium toxicity based on hx alone.         Daryl Kaplan, MD 05/26/12 2023

## 2012-05-26 NOTE — ED Notes (Signed)
Patient transferred to Rm 20 from FT 10.  Patients thoughts are all jumbled and do not make sense.  When asked why he was here stated that he came here because his bother had to go out of town to get a light.  Call bell at bedside, instructed on use of the call bell.  Has several scratches all over his body.

## 2012-05-27 ENCOUNTER — Encounter (HOSPITAL_COMMUNITY): Payer: Self-pay

## 2012-05-27 LAB — URINALYSIS, ROUTINE W REFLEX MICROSCOPIC
Glucose, UA: NEGATIVE mg/dL
Ketones, ur: NEGATIVE mg/dL
Leukocytes, UA: NEGATIVE
Nitrite: NEGATIVE
Protein, ur: NEGATIVE mg/dL
Urobilinogen, UA: 1 mg/dL (ref 0.0–1.0)

## 2012-05-27 LAB — RAPID URINE DRUG SCREEN, HOSP PERFORMED
Amphetamines: NOT DETECTED
Cocaine: NOT DETECTED
Opiates: NOT DETECTED
Tetrahydrocannabinol: NOT DETECTED

## 2012-05-27 MED ORDER — LORAZEPAM 1 MG PO TABS
1.0000 mg | ORAL_TABLET | Freq: Four times a day (QID) | ORAL | Status: DC | PRN
Start: 2012-05-27 — End: 2012-05-28

## 2012-05-27 MED ORDER — IBUPROFEN 200 MG PO TABS
600.0000 mg | ORAL_TABLET | Freq: Three times a day (TID) | ORAL | Status: DC | PRN
  Administered 2012-05-27: 600 mg via ORAL
  Filled 2012-05-27: qty 3

## 2012-05-27 MED ORDER — LEVETIRACETAM 500 MG PO TABS
500.0000 mg | ORAL_TABLET | Freq: Two times a day (BID) | ORAL | Status: DC
  Administered 2012-05-27 (×2): 500 mg via ORAL
  Filled 2012-05-27 (×4): qty 1

## 2012-05-27 MED ORDER — SERTRALINE HCL 50 MG PO TABS
100.0000 mg | ORAL_TABLET | Freq: Every day | ORAL | Status: DC
  Administered 2012-05-27: 100 mg via ORAL
  Filled 2012-05-27: qty 2

## 2012-05-27 MED ORDER — ONDANSETRON HCL 8 MG PO TABS: 4.0000 mg | ORAL_TABLET | Freq: Three times a day (TID) | ORAL | Status: DC | PRN

## 2012-05-27 MED ORDER — VITAMIN B-1 100 MG PO TABS
100.0000 mg | ORAL_TABLET | Freq: Every day | ORAL | Status: DC
  Administered 2012-05-27: 100 mg via ORAL
  Filled 2012-05-27: qty 1

## 2012-05-27 MED ORDER — LITHIUM CARBONATE 300 MG PO CAPS
300.0000 mg | ORAL_CAPSULE | Freq: Three times a day (TID) | ORAL | Status: DC
  Administered 2012-05-27 (×3): 300 mg via ORAL
  Filled 2012-05-27 (×8): qty 1

## 2012-05-27 MED ORDER — HALOPERIDOL 2 MG PO TABS
2.0000 mg | ORAL_TABLET | Freq: Three times a day (TID) | ORAL | Status: DC
  Administered 2012-05-27 (×3): 2 mg via ORAL
  Filled 2012-05-27 (×6): qty 1

## 2012-05-27 MED ORDER — LORAZEPAM 1 MG PO TABS: 1.0000 mg | ORAL_TABLET | Freq: Three times a day (TID) | ORAL | Status: DC | PRN

## 2012-05-27 MED ORDER — ZOLPIDEM TARTRATE 5 MG PO TABS: 5.0000 mg | ORAL_TABLET | Freq: Every evening | ORAL | Status: DC | PRN

## 2012-05-27 MED ORDER — NICOTINE 21 MG/24HR TD PT24
21.0000 mg | MEDICATED_PATCH | Freq: Every day | TRANSDERMAL | Status: DC
  Filled 2012-05-27: qty 1

## 2012-05-27 MED ORDER — BENZTROPINE MESYLATE 1 MG PO TABS
1.0000 mg | ORAL_TABLET | Freq: Two times a day (BID) | ORAL | Status: DC
  Administered 2012-05-27 (×2): 1 mg via ORAL
  Filled 2012-05-27 (×4): qty 1

## 2012-05-27 MED ORDER — ASPIRIN EC 81 MG PO TBEC
81.0000 mg | DELAYED_RELEASE_TABLET | Freq: Every day | ORAL | Status: DC
  Administered 2012-05-27: 81 mg via ORAL
  Filled 2012-05-27 (×2): qty 1

## 2012-05-27 NOTE — ED Notes (Signed)
Placed call to [facility] for transport 

## 2012-05-27 NOTE — ED Notes (Addendum)
[facility] called for transport. Pt updated on plan of care: aware that he is awaiting transport.

## 2012-05-27 NOTE — BH Assessment (Signed)
Assessment Note   Daryl Campbell is an 61 y.o. male that was assessed this day.  Pt presented to [facility] via EMS after complaining of scratches on his body from walking through the woods.  Pt not assessed last night by last clinician on shift due to pt being disoriented/psychotic by report.  Pt continues to present as bizarre, endorsing auditory and visual hallucinations.  Pt is oriented to person and place but cannot recall where he lives.  "I am in a daze."  Pt cannot recall his medications.  Pt did state he has a history of Bipolar Disorder and Schizophrenia.  Pt denies SI, HI or SA.  Per last assessment, pt has a history of drinking beer and huffing glue.  Pt currently denies.  Pt cannot elaborate on the voices he hears.  Pt is disheveled.  Pt pleasant and cooperative and stated, "I'm so sorry," when he could not recall things.  Per last assessment, pt could not return to nursing home he was living in.  Completed assessment, assessment notification and faxed to [facility] to run for possible admission.  Called [facility], per [place] @ [phone], will not take pt if he has a hx of SA.  Called OV, no beds per [facility] @ [phone], but was asked to send referral to be considered for wait list as did Lloyd Huger at [facility], as they have no beds today.  Updated ED staff.  Axis I: Psychotic Disorder NOS Axis II: Deferred Axis III:  Past Medical History  Diagnosis Date  . Hypertension   . Psychiatric disorder   . Hepatitis C   . Seizures   . Schizophrenia   . Bipolar affective    Axis IV: housing problems, other psychosocial or environmental problems, problems related to social environment and problems with primary support group Axis V: 21-30 behavior considerably influenced by delusions or hallucinations OR serious impairment in judgment, communication OR inability to function in almost all areas  Past Medical History:  Past Medical History  Diagnosis Date  . Hypertension   . Psychiatric disorder   . Hepatitis C     . Seizures   . Schizophrenia   . Bipolar affective     History reviewed. No pertinent past surgical history.  Family History: History reviewed. No pertinent family history.  Social History:  reports that he has been smoking Cigarettes.  He has a 1.5 pack-year smoking history. He has never used smokeless tobacco. He reports that he uses illicit drugs. He reports that he does not drink alcohol.  Additional Social History:  Alcohol / Drug Use Pain Medications: see MAR Prescriptions: see MAR Over the Counter: see MAR History of alcohol / drug use?: Yes (unknown - per last assessment, pt huffs glue) Negative Consequences of Use:  (UTA - per last assessment, 6 months) Withdrawal Symptoms:  (UTA)  CIWA: CIWA-Ar BP: 94/65 mmHg Pulse Rate: 64  COWS:    Allergies: No Known Allergies  Home Medications:  (Not in a hospital admission)  OB/GYN Status:  No LMP for male patient.  General Assessment Data Location of Assessment: [facility] ED Living Arrangements: Other (Comment) (Homeless) Can pt return to current living arrangement?:  (Unknown) Admission Status: Voluntary Is patient capable of signing voluntary admission?: No Transfer from: Acute Hospital (pt is psychotic) Referral Source: Other (EMS)  Education Status Is patient currently in school?: No Highest grade of school patient has completed: 10 (completed GED)  Risk to self Suicidal Ideation: No Suicidal Intent: No Is patient at risk  for suicide?: No Suicidal Plan?: No Access to Means: No What has been your use of drugs/alcohol within the last 12 months?: per last assessment, pt has hx of drinking beer and huffing glue Previous Attempts/Gestures: No How many times?: 0  Other Self Harm Risks: UTA Triggers for Past Attempts: None known Intentional Self Injurious Behavior: Cutting Comment - Self Injurious Behavior: Hx cutting 1 year ago per last assessment Family Suicide History: No Recent stressful life event(s): Financial  Problems;Other (Comment) (Asked to leave rest home, homeless) Persecutory voices/beliefs?: Yes Depression:  (UTA) Depression Symptoms:  (UTA) Substance abuse history and/or treatment for substance abuse?: Yes Suicide prevention information given to non-admitted patients: Not applicable  Risk to Others Homicidal Ideation: No Thoughts of Harm to Others: No Current Homicidal Intent: No Current Homicidal Plan: No Access to Homicidal Means: No Identified Victim: na History of harm to others?: No Assessment of Violence: None Noted Violent Behavior Description: na - pt calm, cooperative Does patient have access to weapons?: No Criminal Charges Pending?: No Does patient have a court date: No  Psychosis Hallucinations: Auditory (cannot elaborate on the voices) Delusions: None noted (has hx of persecutory delusions)  Mental Status Report Appear/Hygiene: Disheveled;Poor hygiene;Bizarre Eye Contact: Fair Motor Activity: Unremarkable Speech: Slurred Level of Consciousness: Alert Mood: Other (Comment) (Euthymic) Affect: Blunted Anxiety Level: Minimal Thought Processes: Relevant Judgement: Impaired Orientation: Not oriented Obsessive Compulsive Thoughts/Behaviors: Minimal  Cognitive Functioning Concentration: Decreased Memory: Recent Impaired;Remote Impaired IQ: Average Insight:  (UTA) Impulse Control: Fair Appetite: Good Weight Loss:  (UTA - 20 lbs per last assessment) Weight Gain: 0  Sleep: Decreased Total Hours of Sleep:  (pt is unsure how much sleep he is getting) Vegetative Symptoms: Decreased grooming;Not bathing  ADLScreening [facility]) Patient's cognitive ability adequate to safely complete daily activities?: Yes Patient able to express need for assistance with ADLs?: Yes Independently performs ADLs?: Yes (appropriate for developmental age)  Abuse/Neglect [facility]) Physical Abuse:  (UTA) Verbal Abuse:  (UTA) Sexual Abuse:  (UTA)  Prior Inpatient  Therapy Prior Inpatient Therapy: Yes Prior Therapy Dates: January-April 2013 Prior Therapy Facilty/Provider(s): [facility] Reason for Treatment: psychosis  Prior Outpatient Therapy Prior Outpatient Therapy: Yes Prior Therapy Dates: day treatment  Prior Therapy Facilty/Provider(s): [facility] Reason for Treatment: Schizophrenia  ADL Screening (condition at time of admission) Patient's cognitive ability adequate to safely complete daily activities?: Yes Patient able to express need for assistance with ADLs?: Yes Independently performs ADLs?: Yes (appropriate for developmental age) Weakness of Legs: None Weakness of Arms/Hands: None  Home Assistive Devices/Equipment Home Assistive Devices/Equipment: None    Abuse/Neglect Assessment (Assessment to be complete while patient is alone) Physical Abuse:  (UTA) Verbal Abuse:  (UTA) Sexual Abuse:  (UTA) Exploitation of patient/patient's resources:  (UTA) Self-Neglect:  (UTA) Values / Beliefs Cultural Requests During Hospitalization: None Spiritual Requests During Hospitalization: None Consults Spiritual Care Consult Needed: No Social Work Consult Needed: No Merchant navy officer (For Healthcare) Advance Directive: Patient does not have advance directive    Additional Information 1:1 In Past 12 Months?:  (UTA) CIRT Risk: No Elopement Risk: No Does patient have medical clearance?: Yes     Disposition:  Disposition Disposition of Patient: Referred to;Inpatient treatment program Type of inpatient treatment program: Adult Patient referred to: Other (Comment) (Pending OV and [facility]on [facility]), [facility]  On Site Evaluation by:   Reviewed with Physician:  Earley Brooke, Rennis Harding 05/27/2012 9:08 AM

## 2012-05-27 NOTE — ED Notes (Signed)
[facility] contacted in regards to the return of pt to facility. Spoke with Diplomatic Services operational officer. Who stated he would have the nurse call me back. Berna Spare from [facility] Team notified.

## 2012-05-27 NOTE — ED Provider Notes (Signed)
Filed Vitals:   05/26/12 1649 05/27/12 0026 05/27/12 0607  BP: 127/78 115/62 94/65  Pulse: 85 70 64  Temp: 98 F (36.7 C) 98.3 F (36.8 C) 99 F (37.2 C)  TempSrc:  Oral Oral  Resp: 18 18 18   SpO2: 92% 96% 96%   Pt asking to leave.  Ordered Telepsych for evaluation.  No complaints.  Vitals stable.  Jones Skene, MD 05/28/12 1610

## 2012-05-27 NOTE — BH Assessment (Signed)
[facility] Assessment Progress Note      Update:  Pt received telepsych recommending social work for placement and pt be discharged, as per telepsychiatrist, pt does not meet criteria for inpatient admission.  EDP Bonk ordered SW consult.  Called and spoke with Delice Bison, CSW @ 1515 to inform her of consult.  She was familiar with pt and gave Clinical research associate pt DSS guardian's number Elnita Maxwell [place] [phone]).  Writer spoke with [place] @ 1525, who stated pt still resided at [facility].  Writer called rest home and left message at [phone].  When rest home calls back, they will be informed that pt is up for discharge.  Additional information given by Elnita Maxwell was that he was just at the Day Program in [place] yesterday and that he gets his meds at [facility].  His last shot was last week and he is also prescribed other meds that he has been taking.  Oncoming staff will need to inform her of his disposition.  ED staff notified.

## 2012-05-27 NOTE — ED Notes (Signed)
No complaints voiced.  Instructed patient that we still needed to collect a urine.  He said OK  Cup is sitting on the sink

## 2012-05-27 NOTE — ED Notes (Addendum)
Pt alert to person and place. Speech is mumbled. Pt able to understand and follow commands. Verbal responses inconstant at time. Denies pain. Denies N/V/D/C. Respirations even and regular. Skin warm and Dry. Superficial lacerations noted on upper and lower extermities bilaterally. States "from walking in the woods".a Vitals stable. NAD. Environment safe. Given [place] sandwich and apple juice per request. No further needs at this time.  Pt sitting up in bed eating sandwich and watching TV.

## 2012-05-27 NOTE — ED Provider Notes (Signed)
[facility] accepted at [facility].  D/C to return there.  Gerhard Munch, MD 05/27/12 2037

## 2012-05-27 NOTE — ED Notes (Addendum)
Pt alert to person, place, and situation. Respirations even and regular. Vitals stable. Skin warm and dry. Denies pain. Denies N/V/D/C. NAD. Ambulatory. Belongings given to PETAR. No further questions at this time.

## 2012-05-27 NOTE — ED Notes (Signed)
Patient unable to answer question for psych evaluation. Patient is pleasant and answers yes and no appropriately to questions regarding whether or not he would like something to eat, drink. turning light off, or closing the door to his room. Patient is cooperative and takes his medications when asked to do so.

## 2012-06-13 ENCOUNTER — Emergency Department (HOSPITAL_COMMUNITY)
Admission: EM | Admit: 2012-06-13 | Discharge: 2012-06-13 | Disposition: A | Discharge status: Home / Self Care | Payer: Medicare Other | Attending: Emergency Medicine | Admitting: Emergency Medicine

## 2012-06-13 ENCOUNTER — Emergency Department (HOSPITAL_COMMUNITY): Payer: Medicare Other

## 2012-06-13 ENCOUNTER — Encounter (HOSPITAL_COMMUNITY): Payer: Self-pay | Admitting: *Deleted

## 2012-06-13 DIAGNOSIS — F17200 Nicotine dependence, unspecified, uncomplicated: Secondary | ICD-10-CM | POA: Insufficient documentation

## 2012-06-13 DIAGNOSIS — Z8619 Personal history of other infectious and parasitic diseases: Secondary | ICD-10-CM | POA: Insufficient documentation

## 2012-06-13 DIAGNOSIS — I10 Essential (primary) hypertension: Secondary | ICD-10-CM | POA: Insufficient documentation

## 2012-06-13 DIAGNOSIS — M1610 Unilateral primary osteoarthritis, unspecified hip: Secondary | ICD-10-CM | POA: Insufficient documentation

## 2012-06-13 DIAGNOSIS — G40909 Epilepsy, unspecified, not intractable, without status epilepticus: Secondary | ICD-10-CM | POA: Insufficient documentation

## 2012-06-13 DIAGNOSIS — Y9361 Activity, american tackle football: Secondary | ICD-10-CM | POA: Insufficient documentation

## 2012-06-13 DIAGNOSIS — S7010XA Contusion of unspecified thigh, initial encounter: Secondary | ICD-10-CM | POA: Insufficient documentation

## 2012-06-13 DIAGNOSIS — M169 Osteoarthritis of hip, unspecified: Secondary | ICD-10-CM | POA: Insufficient documentation

## 2012-06-13 DIAGNOSIS — S8012XA Contusion of left lower leg, initial encounter: Secondary | ICD-10-CM

## 2012-06-13 DIAGNOSIS — F209 Schizophrenia, unspecified: Secondary | ICD-10-CM | POA: Insufficient documentation

## 2012-06-13 DIAGNOSIS — X58XXXA Exposure to other specified factors, initial encounter: Secondary | ICD-10-CM | POA: Insufficient documentation

## 2012-06-13 DIAGNOSIS — Z79899 Other long term (current) drug therapy: Secondary | ICD-10-CM | POA: Insufficient documentation

## 2012-06-13 MED ORDER — IBUPROFEN 400 MG PO TABS
600.0000 mg | ORAL_TABLET | Freq: Once | ORAL | Status: AC
Start: 2012-06-13 — End: 2012-06-13
  Administered 2012-06-13: 600 mg via ORAL
  Filled 2012-06-13: qty 2

## 2012-06-13 NOTE — ED Notes (Signed)
Pt is here for left leg pain.  Pain has been present for "a couple of weeks"   Pain left thigh and hip, began after he had a fall

## 2012-06-13 NOTE — ED Provider Notes (Signed)
History   This chart was scribed for No att. providers found by Toya Smothers. The patient was seen in room TR07C/TR07C. Patient's care was started at 1036.  CSN: [id number]  Arrival date & time 06/13/12  1036   First MD Initiated Contact with Patient 06/13/12 1131      Chief Complaint  Patient presents with  . Leg Pain   The history is provided by the patient. No language interpreter was used.    Daryl Campbell is a 61 y.o. male with a h/o HTN, Psychiatric disorder, Hep C, and schizophrenia who presents to the Emergency Department complaining of 24 hours of gradual onset moderate constant left thigh pain after playing football yesterday. Pt denotes mild difficulty gaiting. PTA Pt has not treated symptoms. Pt denies fever, back pain, emesis, nausea, rash, and cough.  Pt lists PCP as Dr. Earlene Plater.    Past Medical History  Diagnosis Date  . Hypertension   . Psychiatric disorder   . Hepatitis C   . Seizures   . Schizophrenia   . Bipolar affective     History reviewed. No pertinent past surgical history.  No family history on file.  History  Substance Use Topics  . Smoking status: Current Every Day Smoker -- 0.5 packs/day for 3 years    Types: Cigarettes  . Smokeless tobacco: Never Used  . Alcohol Use: No   Review of Systems  Musculoskeletal:       Extremity pain  All other systems reviewed and are negative.    Allergies  Review of patient's allergies indicates no known allergies.  Home Medications   Current Outpatient Rx  Name Route Sig Dispense Refill  . ASPIRIN EC 81 MG PO TBEC Oral Take 81 mg by mouth daily.    Marland Kitchen BENZTROPINE MESYLATE 1 MG PO TABS Oral Take 1 mg by mouth 2 (two) times daily.    Marland Kitchen VITAMIN D 1000 UNITS PO TABS Oral Take 1,000 Units by mouth daily.    Marland Kitchen HALOPERIDOL 2 MG PO TABS Oral Take 2 mg by mouth 3 (three) times daily.    Marland Kitchen HALOPERIDOL DECANOATE 50 MG/ML IM SOLN Intramuscular Inject 200 mg into the muscle every 28 (twenty-eight) days.    .  IBUPROFEN 200 MG PO TABS Oral Take 600 mg by mouth every 6 (six) hours as needed. For pain    . LEVETIRACETAM 500 MG PO TABS Oral Take 500 mg by mouth 2 (two) times daily.    Marland Kitchen LITHIUM CARBONATE 300 MG PO CAPS Oral Take 300 mg by mouth 3 (three) times daily with meals.    Marland Kitchen LORAZEPAM 2 MG PO TABS Oral Take 2 mg by mouth every 6 (six) hours as needed. For aggitation    . THERA M PLUS PO TABS Oral Take 1 tablet by mouth daily.    Marland Kitchen POLYETHYLENE GLYCOL 3350 PO PACK Oral Take 17 g by mouth daily as needed. For constipation    . SERTRALINE HCL 100 MG PO TABS Oral Take 100 mg by mouth daily.    . THIAMINE HCL 100 MG PO TABS Oral Take 100 mg by mouth daily.      BP 103/78  Pulse 62  Temp 98.4 F (36.9 C) (Oral)  Resp 20  SpO2 97%  Physical Exam  Nursing note and vitals reviewed. Constitutional: He is oriented to person, place, and time. He appears well-developed and well-nourished. No distress.  HENT:  Head: Normocephalic and atraumatic.  Eyes: Conjunctivae normal and EOM are normal.  Neck: Neck supple. No tracheal deviation present.  Cardiovascular: Normal rate.   Pulmonary/Chest: Effort normal. No respiratory distress.  Abdominal: He exhibits no distension.  Musculoskeletal: Normal range of motion.       Tenderness to left thigh. No visible injury. Capable of gaiting.  Neurological: He is alert and oriented to person, place, and time. No sensory deficit.  Skin: Skin is dry.  Psychiatric: He has a normal mood and affect.    ED Course  Procedures DIAGNOSTIC STUDIES: Oxygen Saturation is 97% on room air, normal by my interpretation.    COORDINATION OF CARE: 12:27- Evaluated Pt. Pt is awake and without distress. 12:29- Patient informed of clinical course, understand medical decision-making process, and agree with plan. Plan: Home Medications- Ibuprofen; Home Treatments- Cold compress 3x daily; Recommended follow up- with PCP.   Labs Reviewed - No data to display Dg Hip Complete  Left  06/13/2012  *RADIOLOGY REPORT*  Clinical Data:  Left hip pain radiating down left leg, recent falls  LEFT HIP - COMPLETE 2+ VIEW  Comparison: None.  Findings: No acute fracture, or malalignment.  The femoral head is located.  There is moderate bilateral hip joint osteoarthritis with narrowing of the superolateral joint space and osteophyte formation at the femoral head neck junctions bilaterally.  The bony pelvis appears intact.  No significant lumbar spine degenerative disease. Visualized bowel gas pattern is unremarkable.  IMPRESSION:  1.  No acute fracture, malalignment or osseous findings. 2.  Moderate bilateral hip joint osteoarthritis.   Original Report Authenticated By: Sterling Big, M.D.      1. Contusion of left leg       MDM  Leg contusion, without fracture, or significant muscular injury. He is stable for discharge   I personally performed the services described in this documentation, which was scribed in my presence. The recorded information has been reviewed and considered.       Flint Melter, MD 06/13/12 1859

## 2012-06-13 NOTE — ED Notes (Signed)
States has fallen several times in the recent past. Does not remember when. C/O left thigh pain. Ambulated to room without difficulty.

## 2012-06-13 NOTE — ED Notes (Signed)
Pt's ride was called. Pt taken to lobby to wait for his ride.

## 2012-07-03 ENCOUNTER — Emergency Department (HOSPITAL_COMMUNITY)
Admission: EM | Admit: 2012-07-03 | Discharge: 2012-07-03 | Disposition: A | Discharge status: Home Health | Payer: Medicare Other | Attending: Emergency Medicine | Admitting: Emergency Medicine

## 2012-07-03 ENCOUNTER — Encounter (HOSPITAL_COMMUNITY): Payer: Self-pay

## 2012-07-03 DIAGNOSIS — B1920 Unspecified viral hepatitis C without hepatic coma: Secondary | ICD-10-CM | POA: Insufficient documentation

## 2012-07-03 DIAGNOSIS — F209 Schizophrenia, unspecified: Secondary | ICD-10-CM | POA: Insufficient documentation

## 2012-07-03 DIAGNOSIS — Y9389 Activity, other specified: Secondary | ICD-10-CM | POA: Insufficient documentation

## 2012-07-03 DIAGNOSIS — F29 Unspecified psychosis not due to a substance or known physiological condition: Secondary | ICD-10-CM | POA: Insufficient documentation

## 2012-07-03 DIAGNOSIS — Y929 Unspecified place or not applicable: Secondary | ICD-10-CM | POA: Insufficient documentation

## 2012-07-03 DIAGNOSIS — R569 Unspecified convulsions: Secondary | ICD-10-CM | POA: Insufficient documentation

## 2012-07-03 DIAGNOSIS — F319 Bipolar disorder, unspecified: Secondary | ICD-10-CM | POA: Insufficient documentation

## 2012-07-03 DIAGNOSIS — Z79899 Other long term (current) drug therapy: Secondary | ICD-10-CM | POA: Insufficient documentation

## 2012-07-03 DIAGNOSIS — S7010XA Contusion of unspecified thigh, initial encounter: Secondary | ICD-10-CM | POA: Insufficient documentation

## 2012-07-03 DIAGNOSIS — W010XXA Fall on same level from slipping, tripping and stumbling without subsequent striking against object, initial encounter: Secondary | ICD-10-CM | POA: Insufficient documentation

## 2012-07-03 DIAGNOSIS — F17200 Nicotine dependence, unspecified, uncomplicated: Secondary | ICD-10-CM | POA: Insufficient documentation

## 2012-07-03 DIAGNOSIS — Z7982 Long term (current) use of aspirin: Secondary | ICD-10-CM | POA: Insufficient documentation

## 2012-07-03 DIAGNOSIS — I10 Essential (primary) hypertension: Secondary | ICD-10-CM | POA: Insufficient documentation

## 2012-07-03 MED ORDER — IBUPROFEN 600 MG PO TABS: 600.0000 mg | ORAL_TABLET | Freq: Four times a day (QID) | ORAL | Status: DC | PRN

## 2012-07-03 NOTE — ED Notes (Signed)
Pt washing feet, putting clothing back on

## 2012-07-03 NOTE — ED Provider Notes (Signed)
History   This chart was scribed for [employer]. Rubin Payor, MD by Sofie Rower. The patient was seen in room TR06C/TR06C and the patient's care was started at 12:40PM.    CSN: [id number]  Arrival date & time 07/03/12  1232   First MD Initiated Contact with Patient 07/03/12 1240      Chief Complaint  Patient presents with  . Leg Pain    (Consider location/radiation/quality/duration/timing/severity/associated sxs/prior treatment) Patient is a 61 y.o. male presenting with leg pain. The history is provided by the patient. No language interpreter was used.  Leg Pain     KAILER ZIPAY is a 61 y.o. male , with a hx of fall and left hip pain (06/13/12), who presents to the Emergency Department complaining of sudden, progressively worsening, leg pain located at the left leg, onset today (07/03/12). The pt reports he tripped, fell, and landed upon his left hip earlier today. Modifying factors include certain movements and positions of the left leg which intensifies the leg pain.   The pt denies LOC associated with the fall causing his left leg pain.    The pt is a current everyday smoker (0.5 packs/day), however, he does not drink alcohol.      Past Medical History  Diagnosis Date  . Hypertension   . Psychiatric disorder   . Hepatitis C   . Seizures   . Schizophrenia   . Bipolar affective     History reviewed. No pertinent past surgical history.  No family history on file.  History  Substance Use Topics  . Smoking status: Current Every Day Smoker -- 0.5 packs/day for 3 years    Types: Cigarettes  . Smokeless tobacco: Never Used  . Alcohol Use: No      Review of Systems  All other systems reviewed and are negative.    Allergies  Review of patient's allergies indicates no known allergies.  Home Medications   Current Outpatient Rx  Name Route Sig Dispense Refill  . ASPIRIN EC 81 MG PO TBEC Oral Take 81 mg by mouth daily.    Marland Kitchen BENZTROPINE MESYLATE 1 MG PO TABS Oral  Take 1 mg by mouth 2 (two) times daily.    Marland Kitchen VITAMIN D 1000 UNITS PO TABS Oral Take 1,000 Units by mouth daily.    Marland Kitchen HALOPERIDOL 2 MG PO TABS Oral Take 2 mg by mouth 3 (three) times daily.    Marland Kitchen HALOPERIDOL DECANOATE 50 MG/ML IM SOLN Intramuscular Inject 200 mg into the muscle every 28 (twenty-eight) days.    . IBUPROFEN 200 MG PO TABS Oral Take 600 mg by mouth every 6 (six) hours as needed. For pain    . LEVETIRACETAM 500 MG PO TABS Oral Take 500 mg by mouth 2 (two) times daily.    Marland Kitchen LITHIUM CARBONATE 300 MG PO CAPS Oral Take 300 mg by mouth 3 (three) times daily with meals.    Marland Kitchen LORAZEPAM 2 MG PO TABS Oral Take 2 mg by mouth every 6 (six) hours as needed. For aggitation    . THERA M PLUS PO TABS Oral Take 1 tablet by mouth daily.    Marland Kitchen POLYETHYLENE GLYCOL 3350 PO PACK Oral Take 17 g by mouth daily as needed. For constipation    . SERTRALINE HCL 100 MG PO TABS Oral Take 100 mg by mouth daily.    . THIAMINE HCL 100 MG PO TABS Oral Take 100 mg by mouth daily.      BP 113/59  Pulse 66  Temp 98.1 F (36.7 C) (Oral)  Resp 16  SpO2 97%  Physical Exam  Nursing note and vitals reviewed. Constitutional: He is oriented to person, place, and time. He appears well-developed and well-nourished.  HENT:  Head: Atraumatic.  Nose: Nose normal.  Eyes: Conjunctivae normal and EOM are normal.  Neck: Normal range of motion.  Cardiovascular: Normal rate, regular rhythm and normal heart sounds.   Pulmonary/Chest: Effort normal and breath sounds normal.  Abdominal: Soft. Bowel sounds are normal. There is no tenderness.  Musculoskeletal: Normal range of motion. He exhibits tenderness.       Mild tenderness over the left thigh. No ecchymosis detected.   Neurological: He is alert and oriented to person, place, and time.  Skin: Skin is warm and dry.  Psychiatric: He has a normal mood and affect. His behavior is normal.    ED Course  Procedures (including critical care time)    COORDINATION OF  CARE:   12:57 PM- Treatment plan concerning muscle strain and application of muscle relaxer's and pain management discussed with patient. Pt agrees with treatment.     Labs Reviewed - No data to display No results found.   No diagnosis found.    MDM  patietn with fall. Doubt fracture. Given muscle relaxer.       I personally performed the services described in this documentation, which was scribed in my presence. The recorded information has been reviewed and considered.     Juliet Rude. Rubin Payor, MD 07/03/12 [phone]

## 2012-07-03 NOTE — ED Notes (Signed)
Fell yesterday and now c/o left upper leg pain

## 2012-07-03 NOTE — ED Notes (Signed)
Pt presents with 3-4 day h/o L leg pain.  Pt reports tripping and falling, landing on leg.

## 2012-07-12 ENCOUNTER — Emergency Department (HOSPITAL_COMMUNITY)
Admission: EM | Admit: 2012-07-12 | Discharge: 2012-07-12 | Disposition: A | Discharge status: Home / Self Care | Payer: Medicare Other | Attending: Emergency Medicine | Admitting: Emergency Medicine

## 2012-07-12 ENCOUNTER — Encounter (HOSPITAL_COMMUNITY): Payer: Self-pay | Admitting: Emergency Medicine

## 2012-07-12 DIAGNOSIS — Z79899 Other long term (current) drug therapy: Secondary | ICD-10-CM | POA: Insufficient documentation

## 2012-07-12 DIAGNOSIS — F17200 Nicotine dependence, unspecified, uncomplicated: Secondary | ICD-10-CM | POA: Insufficient documentation

## 2012-07-12 DIAGNOSIS — Z9181 History of falling: Secondary | ICD-10-CM | POA: Insufficient documentation

## 2012-07-12 DIAGNOSIS — Z8619 Personal history of other infectious and parasitic diseases: Secondary | ICD-10-CM | POA: Insufficient documentation

## 2012-07-12 DIAGNOSIS — F209 Schizophrenia, unspecified: Secondary | ICD-10-CM | POA: Insufficient documentation

## 2012-07-12 DIAGNOSIS — Z7982 Long term (current) use of aspirin: Secondary | ICD-10-CM | POA: Insufficient documentation

## 2012-07-12 DIAGNOSIS — M25559 Pain in unspecified hip: Secondary | ICD-10-CM | POA: Insufficient documentation

## 2012-07-12 DIAGNOSIS — I10 Essential (primary) hypertension: Secondary | ICD-10-CM | POA: Insufficient documentation

## 2012-07-12 DIAGNOSIS — Z593 Problems related to living in residential institution: Secondary | ICD-10-CM | POA: Insufficient documentation

## 2012-07-12 DIAGNOSIS — M25569 Pain in unspecified knee: Secondary | ICD-10-CM

## 2012-07-12 DIAGNOSIS — Z789 Other specified health status: Secondary | ICD-10-CM | POA: Insufficient documentation

## 2012-07-12 DIAGNOSIS — F319 Bipolar disorder, unspecified: Secondary | ICD-10-CM | POA: Insufficient documentation

## 2012-07-12 NOTE — ED Notes (Signed)
Per EMS - Pt from group home, [address] [facility], telephone [phone],  no name on residence. Pt c/o 2 wk hx of left knee pain, fell 2 wks ago. No pain on palpation, slight pain on ROM, walks w/o difficulty.

## 2012-07-12 NOTE — ED Provider Notes (Signed)
History  This chart was scribed for Remi Haggard, non-physician practitioner, working with Doug Sou, MD by Bennett Scrape, ED Scribe. This patient was seen in room WTR6/WTR6 and the patient's care was started at 7:19PM.  CSN: [id number]  Arrival date & time 07/12/12  1903  First MD Initiated Contact with Patient 07/12/12 1919      Chief Complaint  Patient presents with  . Knee Pain  . Hip Pain    Patient is a 61 y.o. male presenting with knee pain. The history is provided by the patient. No language interpreter was used.  Knee Pain This is a new problem. The current episode started more than 1 week ago. The problem occurs constantly. The problem has not changed since onset.The symptoms are aggravated by bending. The symptoms are relieved by rest. He has tried nothing for the symptoms.    Daryl Campbell is a 61 y.o. male brought in by ambulance from a group home (has been there for 2 weeks) with a h/o schizophrenia, who presents to the Emergency Department complaining of 2 weeks of left knee pain and left hip pain after a fall 2 weeks ago. He reports that the symptoms are worsen with bending down to the ground and he reports that he has been rubbing the area with no improvement in his symptoms. He denies taking OTC medications at home to improve symptoms. He was seen in the [facility] ED for the same on 07/03/12 and had a negative left hip x-ray. He also has a h/o HTN, Hep C and seizures. He is a current everyday smoker but denies alcohol use.  Past Medical History  Diagnosis Date  . Hypertension   . Psychiatric disorder   . Hepatitis C   . Seizures   . Schizophrenia   . Bipolar affective     History reviewed. No pertinent past surgical history.  History reviewed. No pertinent family history.  History  Substance Use Topics  . Smoking status: Current Every Day Smoker -- 0.5 packs/day for 3 years    Types: Cigarettes  . Smokeless tobacco: Never Used  . Alcohol Use: No       Review of Systems  Constitutional: Negative.   HENT: Negative.   Respiratory: Negative.   Cardiovascular: Negative.   Gastrointestinal: Negative.   Musculoskeletal:       Positive for left knee pain  Skin: Negative.   Neurological: Negative.   Hematological: Negative.   Psychiatric/Behavioral: Negative.   All other systems reviewed and are negative.    Allergies  Review of patient's allergies indicates no known allergies.  Home Medications   Current Outpatient Rx  Name  Route  Sig  Dispense  Refill  . ASPIRIN EC 81 MG PO TBEC   Oral   Take 81 mg by mouth daily.         Marland Kitchen BENZTROPINE MESYLATE 1 MG PO TABS   Oral   Take 1 mg by mouth 2 (two) times daily.         Marland Kitchen VITAMIN D 1000 UNITS PO TABS   Oral   Take 1,000 Units by mouth daily.         Marland Kitchen HALOPERIDOL 2 MG PO TABS   Oral   Take 2 mg by mouth 2 (two) times daily.          Marland Kitchen HALOPERIDOL DECANOATE 50 MG/ML IM SOLN   Intramuscular   Inject 200 mg into the muscle every 28 (twenty-eight) days.         Marland Kitchen  LEVETIRACETAM 500 MG PO TABS   Oral   Take 500 mg by mouth 2 (two) times daily.         Marland Kitchen LITHIUM CARBONATE 300 MG PO CAPS   Oral   Take 300 mg by mouth 3 (three) times daily with meals.         Marland Kitchen LORAZEPAM 1 MG PO TABS   Oral   Take 1 mg by mouth every 6 (six) hours as needed. Anxiety         . THERA M PLUS PO TABS   Oral   Take 1 tablet by mouth daily.         . SERTRALINE HCL 100 MG PO TABS   Oral   Take 100 mg by mouth daily.           Triage Vitals: BP 113/64  Pulse 69  Temp 98.9 F (37.2 C) (Oral)  Resp 16  SpO2 100%  Physical Exam  Nursing note and vitals reviewed. Constitutional: He is oriented to person, place, and time. He appears well-developed and well-nourished. No distress.  HENT:  Head: Normocephalic and atraumatic.  Eyes: EOM are normal.  Neck: Neck supple. No tracheal deviation present.  Cardiovascular: Normal rate.   Pulmonary/Chest: Effort  normal. No respiratory distress.  Musculoskeletal: Normal range of motion. He exhibits tenderness. He exhibits no edema.       Mild tenderness to the superior aspect of the left patella with palpation, no edema or warmth noted, no pain with ROM  Neurological: He is alert and oriented to person, place, and time.       Gait is normal  Skin: Skin is warm and dry.  Psychiatric: He has a normal mood and affect. His behavior is normal.    ED Course  Procedures (including critical care time)  DIAGNOSTIC STUDIES: Oxygen Saturation is 100% on room air, normal by my interpretation.    COORDINATION OF CARE: 7:35 PM- Discussed discharge plan which includes knee brace with pt and pt agreed to plan. Advised pt to take Tylenol, ice the knee and elevate the knee to improve pain. Will provide a referral to an orthopedic doctor.  Labs Reviewed - No data to display No results found.   No diagnosis found.    MDM  Schizophrenia/bipolar from group home with L knee pain with bending down to the ground. No deformity. +cms below pain. Prior negative x-ray films.  Knee sleeve provided. Will follow up with pcp as needed.  Ibuprofen for pain.        I personally performed the services described in this documentation, which was scribed in my presence. The recorded information has been reviewed and is accurate.   Remi Haggard, NP 07/13/12 1302

## 2012-07-14 NOTE — ED Provider Notes (Signed)
Medical screening examination/treatment/procedure(s) were performed by non-physician practitioner and as supervising physician I was immediately available for consultation/collaboration.  Doug Sou, MD 07/14/12 [phone]

## 2012-07-26 ENCOUNTER — Encounter (HOSPITAL_COMMUNITY): Payer: Self-pay | Admitting: Emergency Medicine

## 2012-07-26 ENCOUNTER — Emergency Department (HOSPITAL_COMMUNITY)
Admission: EM | Admit: 2012-07-26 | Discharge: 2012-07-27 | Disposition: A | Discharge status: Home / Self Care | Payer: Medicare Other | Attending: Emergency Medicine | Admitting: Emergency Medicine

## 2012-07-26 DIAGNOSIS — IMO0002 Reserved for concepts with insufficient information to code with codable children: Secondary | ICD-10-CM | POA: Insufficient documentation

## 2012-07-26 DIAGNOSIS — Z7982 Long term (current) use of aspirin: Secondary | ICD-10-CM | POA: Insufficient documentation

## 2012-07-26 DIAGNOSIS — G40802 Other epilepsy, not intractable, without status epilepticus: Secondary | ICD-10-CM | POA: Insufficient documentation

## 2012-07-26 DIAGNOSIS — R451 Restlessness and agitation: Secondary | ICD-10-CM

## 2012-07-26 DIAGNOSIS — B159 Hepatitis A without hepatic coma: Secondary | ICD-10-CM | POA: Insufficient documentation

## 2012-07-26 DIAGNOSIS — F209 Schizophrenia, unspecified: Secondary | ICD-10-CM | POA: Insufficient documentation

## 2012-07-26 DIAGNOSIS — I10 Essential (primary) hypertension: Secondary | ICD-10-CM | POA: Insufficient documentation

## 2012-07-26 DIAGNOSIS — F17200 Nicotine dependence, unspecified, uncomplicated: Secondary | ICD-10-CM | POA: Insufficient documentation

## 2012-07-26 DIAGNOSIS — F319 Bipolar disorder, unspecified: Secondary | ICD-10-CM | POA: Insufficient documentation

## 2012-07-26 DIAGNOSIS — Z79899 Other long term (current) drug therapy: Secondary | ICD-10-CM | POA: Insufficient documentation

## 2012-07-26 LAB — RAPID URINE DRUG SCREEN, HOSP PERFORMED
Amphetamines: NOT DETECTED
Barbiturates: NOT DETECTED
Benzodiazepines: NOT DETECTED

## 2012-07-26 LAB — CBC WITH DIFFERENTIAL/PLATELET
Eosinophils Absolute: 0.1 10*3/uL (ref 0.0–0.7)
Eosinophils Relative: 2 % (ref 0–5)
HCT: 39.2 % (ref 39.0–52.0)
Hemoglobin: 13.9 g/dL (ref 13.0–17.0)
Lymphs Abs: 1.4 10*3/uL (ref 0.7–4.0)
MCH: 31.4 pg (ref 26.0–34.0)
MCV: 88.7 fL (ref 78.0–100.0)
Monocytes Absolute: 0.6 10*3/uL (ref 0.1–1.0)
Monocytes Relative: 9 % (ref 3–12)
Platelets: 203 10*3/uL (ref 150–400)
RBC: 4.42 MIL/uL (ref 4.22–5.81)

## 2012-07-26 LAB — URINALYSIS, ROUTINE W REFLEX MICROSCOPIC
Bilirubin Urine: NEGATIVE
Glucose, UA: NEGATIVE mg/dL
Ketones, ur: NEGATIVE mg/dL
Leukocytes, UA: NEGATIVE
Nitrite: NEGATIVE
Protein, ur: 100 mg/dL — AB

## 2012-07-26 LAB — URINE MICROSCOPIC-ADD ON

## 2012-07-26 LAB — COMPREHENSIVE METABOLIC PANEL
BUN: 15 mg/dL (ref 6–23)
CO2: 24 mEq/L (ref 19–32)
Calcium: 9.7 mg/dL (ref 8.4–10.5)
Creatinine, Ser: 1 mg/dL (ref 0.50–1.35)
GFR calc Af Amer: >90 mL/min (ref >90)
GFR calc non Af Amer: 79 mL/min — ABNORMAL LOW (ref >90)
Glucose, Bld: 102 mg/dL — ABNORMAL HIGH (ref 70–99)
Total Protein: 7.4 g/dL (ref 6.0–8.3)

## 2012-07-26 LAB — ETHANOL: Alcohol, Ethyl (B): <11 mg/dL (ref 0–11)

## 2012-07-26 LAB — ACETAMINOPHEN LEVEL: Acetaminophen (Tylenol), Serum: <15 ug/mL (ref 10–30)

## 2012-07-26 NOTE — ED Notes (Signed)
Up to the bathroom 

## 2012-07-26 NOTE — ED Notes (Signed)
Pt has 2- one dollar bills with belongings. Security called to wand pt

## 2012-07-26 NOTE — ED Notes (Signed)
[employer] called to disturbance at the [facility], pt talking loudly stating he was going to hurt himself, belligerent, hearing voices in his head. Pt is currently calm, speaking clearly w/ nurse, does acknowledges voices in his head that he is talking back to. States the voices are not telling him to hurt himself or to hurt anyone else. States he is depressed and just needs some help. States he stays at rest home but not method to confirm or deny this.

## 2012-07-26 NOTE — ED Notes (Signed)
Pt watching TV laying in his bed.

## 2012-07-26 NOTE — ED Provider Notes (Signed)
9:14 PM Pt has been seen by psych.  He is medically cleared to be released.  His group home cannot come get him until tomorrow am.   Cheri Guppy, MD 07/26/12 2115

## 2012-07-26 NOTE — ED Notes (Signed)
telepsych info faxed and called 

## 2012-07-26 NOTE — ED Provider Notes (Signed)
History     CSN: [id number]  Arrival date & time 07/26/12  1440   First MD Initiated Contact with Patient 07/26/12 1616      Chief Complaint  Patient presents with  . Medical Clearance    (Consider location/radiation/quality/duration/timing/severity/associated sxs/prior treatment) HPI.... level V caveat for mental health disorder.  Patient claims to have schizophrenia and bipolar disorder.  He was disruptive and belligerent in retail store stating he was hearing voices. No frank suicidal or homicidal ideation. Says he is depressed  Past Medical History  Diagnosis Date  . Hypertension   . Psychiatric disorder   . Hepatitis C   . Seizures   . Schizophrenia   . Bipolar affective     History reviewed. No pertinent past surgical history.  No family history on file.  History  Substance Use Topics  . Smoking status: Current Every Day Smoker -- 0.5 packs/day for 3 years    Types: Cigarettes  . Smokeless tobacco: Never Used  . Alcohol Use: No      Review of Systems  Unable to perform ROS: Psychiatric disorder    Allergies  Review of patient's allergies indicates no known allergies.  Home Medications   Current Outpatient Rx  Name  Route  Sig  Dispense  Refill  . ASPIRIN EC 81 MG PO TBEC   Oral   Take 81 mg by mouth daily.         Marland Kitchen BENZTROPINE MESYLATE 1 MG PO TABS   Oral   Take 1 mg by mouth 2 (two) times daily.         Marland Kitchen VITAMIN D 1000 UNITS PO TABS   Oral   Take 1,000 Units by mouth daily.         Marland Kitchen HALOPERIDOL 2 MG PO TABS   Oral   Take 2 mg by mouth 2 (two) times daily.          Marland Kitchen HALOPERIDOL DECANOATE 50 MG/ML IM SOLN   Intramuscular   Inject 200 mg into the muscle every 28 (twenty-eight) days.         Marland Kitchen LEVETIRACETAM 500 MG PO TABS   Oral   Take 500 mg by mouth 2 (two) times daily.         Marland Kitchen LITHIUM CARBONATE 300 MG PO CAPS   Oral   Take 300 mg by mouth 3 (three) times daily with meals.         Marland Kitchen LORAZEPAM 1 MG PO TABS  Oral   Take 1 mg by mouth every 6 (six) hours as needed. Anxiety         . THERA M PLUS PO TABS   Oral   Take 1 tablet by mouth daily.         . SERTRALINE HCL 100 MG PO TABS   Oral   Take 100 mg by mouth daily.           BP 115/74  Pulse 80  Temp 98.8 F (37.1 C) (Oral)  Resp 14  SpO2 97%  Physical Exam  Nursing note and vitals reviewed. Constitutional: He is oriented to person, place, and time. He appears well-developed and well-nourished.       No obvious psychosis  HENT:  Head: Normocephalic and atraumatic.  Eyes: Conjunctivae normal and EOM are normal. Pupils are equal, round, and reactive to light.  Neck: Normal range of motion. Neck supple.  Cardiovascular: Normal rate, regular rhythm and normal heart sounds.   Pulmonary/Chest: Effort normal and  breath sounds normal.  Abdominal: Soft. Bowel sounds are normal.  Musculoskeletal: Normal range of motion.  Neurological: He is alert and oriented to person, place, and time.  Skin: Skin is warm and dry.  Psychiatric:       Flat affect, depressed    ED Course  Procedures (including critical care time)  Labs Reviewed  COMPREHENSIVE METABOLIC PANEL - Abnormal; Notable for the following:    Glucose, Bld 102 (*)     GFR calc non Af Amer 79 (*)     All other components within normal limits  SALICYLATE LEVEL - Abnormal; Notable for the following:    Salicylate Lvl <2.0 (*)     All other components within normal limits  URINALYSIS, ROUTINE W REFLEX MICROSCOPIC - Abnormal; Notable for the following:    Protein, ur 100 (*)     All other components within normal limits  URINE MICROSCOPIC-ADD ON - Abnormal; Notable for the following:    Casts HYALINE CASTS (*)     All other components within normal limits  CBC WITH DIFFERENTIAL  ETHANOL  ACETAMINOPHEN LEVEL  URINE RAPID DRUG SCREEN (HOSP PERFORMED)   No results found.   No diagnosis found.    MDM  Patient has long-standing mental health problems.  I do not  think he is psychotic.  Will get behavioral health consult. Also will get tele-psychiatry consult        Donnetta Hutching, MD 07/26/12 1644

## 2012-07-26 NOTE — BHH Counselor (Signed)
Pt has been cleared for discharge by telepsych. EDP notified and agrees with plan. This Clinical research associate contacted [facility] [phone]) where pt resides. Per Thereasa Distance, pt is able to return to facility but they are unable to provide transportation until 11AM 11/24.  EDP notified and agrees to allow pt to remain in ED until that time.

## 2012-07-27 MED ORDER — ASPIRIN 81 MG PO CHEW
81.0000 mg | CHEWABLE_TABLET | Freq: Every day | ORAL | Status: DC
  Administered 2012-07-27: 81 mg via ORAL
  Filled 2012-07-27: qty 1

## 2012-07-27 MED ORDER — LEVETIRACETAM 500 MG PO TABS
500.0000 mg | ORAL_TABLET | Freq: Two times a day (BID) | ORAL | Status: DC
  Administered 2012-07-27 (×2): 500 mg via ORAL
  Filled 2012-07-27 (×3): qty 1

## 2012-07-27 MED ORDER — LITHIUM CARBONATE 300 MG PO CAPS
300.0000 mg | ORAL_CAPSULE | Freq: Three times a day (TID) | ORAL | Status: DC
  Administered 2012-07-27 (×2): 300 mg via ORAL
  Filled 2012-07-27 (×2): qty 1

## 2012-07-27 MED ORDER — HALOPERIDOL 5 MG PO TABS
10.0000 mg | ORAL_TABLET | Freq: Two times a day (BID) | ORAL | Status: DC
  Administered 2012-07-27 (×2): 10 mg via ORAL
  Filled 2012-07-27: qty 1
  Filled 2012-07-27: qty 2

## 2012-07-27 MED ORDER — GLUCOSE-VITAMIN C 4-6 GM-MG PO CHEW
1.0000 | CHEWABLE_TABLET | Freq: Every morning | ORAL | Status: DC
  Filled 2012-07-27: qty 1

## 2012-07-27 MED ORDER — BENZTROPINE MESYLATE 1 MG PO TABS
0.5000 mg | ORAL_TABLET | Freq: Two times a day (BID) | ORAL | Status: DC
Start: 2012-07-27 — End: 2012-07-27
  Administered 2012-07-27 (×2): 0.5 mg via ORAL
  Filled 2012-07-27 (×2): qty 1

## 2012-07-27 MED ORDER — BENZTROPINE MESYLATE 1 MG/ML IJ SOLN: 0.5000 mg | Freq: Two times a day (BID) | INTRAMUSCULAR | Status: DC

## 2012-07-27 MED ORDER — IBUPROFEN 600 MG PO TABS
600.0000 mg | ORAL_TABLET | Freq: Three times a day (TID) | ORAL | Status: DC
  Administered 2012-07-27: 600 mg via ORAL
  Filled 2012-07-27: qty 1

## 2012-07-27 MED ORDER — ADULT MULTIVITAMIN W/MINERALS CH
1.0000 | ORAL_TABLET | Freq: Every day | ORAL | Status: DC
  Administered 2012-07-27: 1 via ORAL
  Filled 2012-07-27: qty 1

## 2012-07-27 MED ORDER — VITAMIN B-12 1000 MCG PO TABS
1000.0000 ug | ORAL_TABLET | Freq: Every day | ORAL | Status: DC
  Administered 2012-07-27: 1000 ug via ORAL
  Filled 2012-07-27: qty 1

## 2012-07-27 NOTE — ED Notes (Signed)
Pt's ride is here, pt is aware

## 2012-07-27 NOTE — ED Notes (Signed)
Marcelino Duster at care facility contacted and will contact her empoyer to arrainge for his transport.

## 2012-07-27 NOTE — ED Notes (Signed)
Written dc instructions reviewed w/ pt and care giver Thereasa Distance). Both verbalized understanding.  Pt encouraged to follow up with his dr, and to inform staff at the rest home is he starts having suicidal/homicdal thoughts or urges, Pt verbalized understanding.  Pt ambulatory to dc window, belongings returned after leaving the unit.

## 2012-07-27 NOTE — ED Notes (Signed)
Rest home has not arrived to pick the pt up--number listed is busy.  Will continue to contact

## 2012-09-05 ENCOUNTER — Encounter (HOSPITAL_COMMUNITY): Payer: Self-pay | Admitting: Emergency Medicine

## 2012-09-05 ENCOUNTER — Emergency Department (HOSPITAL_COMMUNITY)
Admission: EM | Admit: 2012-09-05 | Discharge: 2012-09-05 | Disposition: A | Discharge status: Home / Self Care | Payer: Medicare Other | Attending: Emergency Medicine | Admitting: Emergency Medicine

## 2012-09-05 DIAGNOSIS — F17200 Nicotine dependence, unspecified, uncomplicated: Secondary | ICD-10-CM | POA: Insufficient documentation

## 2012-09-05 DIAGNOSIS — F209 Schizophrenia, unspecified: Secondary | ICD-10-CM | POA: Insufficient documentation

## 2012-09-05 DIAGNOSIS — M25569 Pain in unspecified knee: Secondary | ICD-10-CM | POA: Insufficient documentation

## 2012-09-05 DIAGNOSIS — F319 Bipolar disorder, unspecified: Secondary | ICD-10-CM | POA: Insufficient documentation

## 2012-09-05 DIAGNOSIS — I10 Essential (primary) hypertension: Secondary | ICD-10-CM | POA: Insufficient documentation

## 2012-09-05 DIAGNOSIS — M79609 Pain in unspecified limb: Secondary | ICD-10-CM | POA: Insufficient documentation

## 2012-09-05 DIAGNOSIS — Z7982 Long term (current) use of aspirin: Secondary | ICD-10-CM | POA: Insufficient documentation

## 2012-09-05 DIAGNOSIS — G40909 Epilepsy, unspecified, not intractable, without status epilepticus: Secondary | ICD-10-CM | POA: Insufficient documentation

## 2012-09-05 DIAGNOSIS — Z8619 Personal history of other infectious and parasitic diseases: Secondary | ICD-10-CM | POA: Insufficient documentation

## 2012-09-05 DIAGNOSIS — G8929 Other chronic pain: Secondary | ICD-10-CM | POA: Insufficient documentation

## 2012-09-05 DIAGNOSIS — M79605 Pain in left leg: Secondary | ICD-10-CM

## 2012-09-05 DIAGNOSIS — Z79899 Other long term (current) drug therapy: Secondary | ICD-10-CM | POA: Insufficient documentation

## 2012-09-05 MED ORDER — ACETAMINOPHEN 325 MG PO TABS: 650.0000 mg | ORAL_TABLET | Freq: Four times a day (QID) | ORAL | Status: DC | PRN

## 2012-09-05 NOTE — ED Notes (Signed)
Pt presenting to ed with c/o left leg pain x couple of months pt with no redness or swelling noted to extremity. Pt's leg is not warm to touch at this time. Pt states he is able to ambulate on his leg at this time.

## 2012-09-05 NOTE — ED Provider Notes (Signed)
History     CSN: [id number]  Arrival date & time 09/05/12  1403   First MD Initiated Contact with Patient 09/05/12 1523      Chief Complaint  Patient presents with  . Leg Pain    (Consider location/radiation/quality/duration/timing/severity/associated sxs/prior treatment) Patient is a 62 y.o. male presenting with leg pain. The history is provided by the patient.  Leg Pain  Incident onset: Pt presenting with a flare up of pain in the left leg. This is a chronic condition that flares up from time to time. Thjs most recent episode of pain started about a week ago. Incident location: Pt states he was more active than usual last week, playing football with his nephew. Pt does not recount specifc trauma to the affected area. There was no injury mechanism. Pain location: Left patella and posterior knee. The quality of the pain is described as aching. The pain is at a severity of 5/10. The pain is mild. The pain has been constant since onset. Pertinent negatives include no numbness, no inability to bear weight, no loss of motion, no muscle weakness, no loss of sensation and no tingling. He reports no foreign bodies present. The symptoms are aggravated by activity and bearing weight. He has tried nothing for the symptoms.    Past Medical History  Diagnosis Date  . Hypertension   . Psychiatric disorder   . Hepatitis C   . Seizures   . Schizophrenia   . Bipolar affective     History reviewed. No pertinent past surgical history.  No family history on file.  History  Substance Use Topics  . Smoking status: Current Every Day Smoker -- 0.5 packs/day for 3 years    Types: Cigarettes  . Smokeless tobacco: Never Used  . Alcohol Use: No      Review of Systems  Constitutional: Negative for fever, chills and diaphoresis.  Respiratory: Negative for apnea, cough, chest tightness, shortness of breath, wheezing and stridor.   Cardiovascular: Negative for chest pain, palpitations and leg  swelling.  Gastrointestinal: Negative for nausea, vomiting, abdominal pain, diarrhea, constipation and abdominal distention.  Musculoskeletal: Positive for arthralgias. Negative for back pain, joint swelling and gait problem.       Chronic pain in left knee both anterior and posterior. Pain does not feel different than other episodes. Pt rates 5/10.   Skin: Negative for rash.  Neurological: Negative for dizziness, tingling, tremors, syncope, weakness, light-headedness, numbness and headaches.  All other systems reviewed and are negative.    Allergies  Review of patient's allergies indicates no known allergies.  Home Medications   Current Outpatient Rx  Name  Route  Sig  Dispense  Refill  . ASPIRIN EC 81 MG PO TBEC   Oral   Take 81 mg by mouth daily.         Marland Kitchen BENZTROPINE MESYLATE 0.5 MG PO TABS   Oral   Take 0.5 mg by mouth 2 (two) times daily.         Marland Kitchen VITAMIN D 1000 UNITS PO TABS   Oral   Take 1,000 Units by mouth daily.         Marland Kitchen HALOPERIDOL 10 MG PO TABS   Oral   Take 10 mg by mouth 2 (two) times daily.         Marland Kitchen HALOPERIDOL DECANOATE 50 MG/ML IM SOLN   Intramuscular   Inject 200 mg into the muscle every 28 (twenty-eight) days.         Marland Kitchen  IBUPROFEN 600 MG PO TABS   Oral   Take 600 mg by mouth every 8 (eight) hours as needed. For pain.         Marland Kitchen LEVETIRACETAM 500 MG PO TABS   Oral   Take 500 mg by mouth 2 (two) times daily.         Marland Kitchen LITHIUM CARBONATE 300 MG PO CAPS   Oral   Take 300 mg by mouth 3 (three) times daily with meals.         Carma Leaven M PLUS PO TABS   Oral   Take 1 tablet by mouth daily.         Marland Kitchen VITAMIN B-12 1000 MCG PO TABS   Oral   Take 1,000 mcg by mouth daily.           BP 105/69  Pulse 82  Temp 98.4 F (36.9 C) (Oral)  Resp 20  SpO2 96%  Physical Exam  Nursing note and vitals reviewed. Constitutional: He is oriented to person, place, and time. He appears well-developed and well-nourished.  HENT:  Head:  Normocephalic and atraumatic.  Neck: Normal range of motion. Neck supple.  Cardiovascular: Normal rate, regular rhythm, normal heart sounds and intact distal pulses.  Exam reveals no gallop and no friction rub.   No murmur heard. Pulmonary/Chest: Effort normal and breath sounds normal. No respiratory distress. He has no wheezes. He has no rales. He exhibits no tenderness.  Abdominal: Soft. Bowel sounds are normal. He exhibits no distension. There is no tenderness. There is no rebound and no guarding.  Musculoskeletal: Normal range of motion. He exhibits no edema and no tenderness.       Legs:      Mild pain on palpation of left knee anterior and posterior. No crepitus. FROM. 5/5 muscle strength. No erythema. No warmth. No swelling. No deformity. No bruising. Negative Lachman, negative posterior drawer, negative McMurray's.  Neurological: He is alert and oriented to person, place, and time.  Skin: Skin is warm and dry. No rash noted. He is not diaphoretic.  Psychiatric: His behavior is normal. Judgment normal.    ED Course  Procedures (including critical care time)  Labs Reviewed - No data to display No results found.  Diagnosis: chronic knee pain    MDM  Mild pain on palpation of left knee anterior and posterior. No crepitus. FROM. 5/5 muscle strength. No erythema. No warmth. No swelling. No deformity. No bruising. Negative Lachman, negative posterior drawer, negative McMurray's. Ruled out acute trauma. Prescribed Tylenol for pain and inflammation of chronic condition. Advised patient to take Tylenol (explained he can also buy this over the counter), ice, elevate, rest, and utilize his knee brace when he feels the pain flare up. Referred patient to ortho to follow for long-term care.      Glade Nurse, PA-C 09/05/12 2331

## 2012-09-06 NOTE — ED Provider Notes (Signed)
Medical screening examination/treatment/procedure(s) were performed by non-physician practitioner and as supervising physician I was immediately available for consultation/collaboration.  Toy Baker, MD 09/06/12 [phone]

## 2012-09-14 ENCOUNTER — Encounter (HOSPITAL_COMMUNITY): Payer: Self-pay | Admitting: *Deleted

## 2012-09-14 ENCOUNTER — Emergency Department (HOSPITAL_COMMUNITY)
Admission: EM | Admit: 2012-09-14 | Discharge: 2012-09-14 | Disposition: A | Discharge status: Home / Self Care | Payer: Medicare Other | Attending: Emergency Medicine | Admitting: Emergency Medicine

## 2012-09-14 ENCOUNTER — Emergency Department (HOSPITAL_COMMUNITY): Payer: Medicare Other

## 2012-09-14 DIAGNOSIS — Z7982 Long term (current) use of aspirin: Secondary | ICD-10-CM | POA: Insufficient documentation

## 2012-09-14 DIAGNOSIS — Z79899 Other long term (current) drug therapy: Secondary | ICD-10-CM | POA: Insufficient documentation

## 2012-09-14 DIAGNOSIS — G40909 Epilepsy, unspecified, not intractable, without status epilepticus: Secondary | ICD-10-CM | POA: Insufficient documentation

## 2012-09-14 DIAGNOSIS — F23 Brief psychotic disorder: Secondary | ICD-10-CM | POA: Insufficient documentation

## 2012-09-14 DIAGNOSIS — G8929 Other chronic pain: Secondary | ICD-10-CM | POA: Insufficient documentation

## 2012-09-14 DIAGNOSIS — Z8619 Personal history of other infectious and parasitic diseases: Secondary | ICD-10-CM | POA: Insufficient documentation

## 2012-09-14 DIAGNOSIS — I10 Essential (primary) hypertension: Secondary | ICD-10-CM | POA: Insufficient documentation

## 2012-09-14 DIAGNOSIS — F319 Bipolar disorder, unspecified: Secondary | ICD-10-CM | POA: Insufficient documentation

## 2012-09-14 DIAGNOSIS — F17200 Nicotine dependence, unspecified, uncomplicated: Secondary | ICD-10-CM | POA: Insufficient documentation

## 2012-09-14 DIAGNOSIS — M25569 Pain in unspecified knee: Secondary | ICD-10-CM | POA: Insufficient documentation

## 2012-09-14 DIAGNOSIS — F209 Schizophrenia, unspecified: Secondary | ICD-10-CM | POA: Insufficient documentation

## 2012-09-14 MED ORDER — NAPROXEN 500 MG PO TABS: 500.0000 mg | ORAL_TABLET | Freq: Two times a day (BID) | ORAL | Status: DC

## 2012-09-14 NOTE — ED Provider Notes (Signed)
History     CSN: [id number]  Arrival date & time 09/14/12  1055   First MD Initiated Contact with Patient 09/14/12 1208      Chief Complaint  Patient presents with  . Knee Pain    (Consider location/radiation/quality/duration/timing/severity/associated sxs/prior treatment) HPI  Daryl Campbell is a 62 y.o. male  with a hx of HTN, schizophrenia, schizoaffective and chronic knee pain presents to the Emergency Department complaining of gradual, persistent, progressively worsening left knee pain onset in the past few weeks.  Patient states he slipped and fell on the ice sometime in the last 2 weeks but he's not sure when.  He states he remembers the fall, he did not lose consciousness, he did not hit his head he does not have neck or back pain.  He states the pain in his knee today is no different than his chronic knee pain.  He states it hurts worse when he tries to cross his legs. Associated symptoms include pain with walking.  Nothing makes it better and walking makes it worse.  Pt denies fever, chills, headache, neck pain, back pain, chest pain, abdominal pain, nausea, vomiting, diarrhea, swelling of the joint, erythema of the joint, weakness, numbness, tingling in the extremity, syncope.     Past Medical History  Diagnosis Date  . Hypertension   . Psychiatric disorder   . Hepatitis C   . Seizures   . Schizophrenia   . Bipolar affective     History reviewed. No pertinent past surgical history.  History reviewed. No pertinent family history.  History  Substance Use Topics  . Smoking status: Current Every Day Smoker -- 0.5 packs/day for 3 years    Types: Cigarettes  . Smokeless tobacco: Never Used  . Alcohol Use: No      Review of Systems  Musculoskeletal: Positive for joint swelling.  Skin: Negative for wound.  Neurological: Negative for numbness.  All other systems reviewed and are negative.    Allergies  Review of patient's allergies indicates no known  allergies.  Home Medications   Current Outpatient Rx  Name  Route  Sig  Dispense  Refill  . ACETAMINOPHEN 325 MG PO TABS   Oral   Take 2 tablets (650 mg total) by mouth every 6 (six) hours as needed for pain.   60 tablet   0   . ASPIRIN EC 81 MG PO TBEC   Oral   Take 81 mg by mouth daily.         Marland Kitchen BENZTROPINE MESYLATE 0.5 MG PO TABS   Oral   Take 0.5 mg by mouth 2 (two) times daily.         Marland Kitchen VITAMIN D 1000 UNITS PO TABS   Oral   Take 1,000 Units by mouth daily.         Marland Kitchen HALOPERIDOL 10 MG PO TABS   Oral   Take 10 mg by mouth 2 (two) times daily.         Marland Kitchen HALOPERIDOL DECANOATE 50 MG/ML IM SOLN   Intramuscular   Inject 200 mg into the muscle every 28 (twenty-eight) days.         . IBUPROFEN 600 MG PO TABS   Oral   Take 600 mg by mouth every 8 (eight) hours as needed. For pain.         Marland Kitchen LEVETIRACETAM 500 MG PO TABS   Oral   Take 500 mg by mouth 2 (two) times daily.         Marland Kitchen  LITHIUM CARBONATE 300 MG PO CAPS   Oral   Take 300 mg by mouth 3 (three) times daily with meals.         Carma Leaven M PLUS PO TABS   Oral   Take 1 tablet by mouth daily.         Marland Kitchen NAPROXEN 500 MG PO TABS   Oral   Take 1 tablet (500 mg total) by mouth 2 (two) times daily with a meal.   30 tablet   0   . VITAMIN B-12 1000 MCG PO TABS   Oral   Take 1,000 mcg by mouth daily.           BP 116/77  Pulse 71  Temp 98.5 F (36.9 C) (Oral)  Resp 16  SpO2 97%  Physical Exam  Nursing note and vitals reviewed. Constitutional: He appears well-developed and well-nourished. No distress.  HENT:  Head: Normocephalic and atraumatic.  Eyes: Conjunctivae normal are normal.  Cardiovascular: Normal rate, regular rhythm, S1 normal, normal heart sounds and intact distal pulses.  Exam reveals no gallop and no friction rub.   No murmur heard. Pulses:      Dorsalis pedis pulses are 2+ on the right side, and 2+ on the left side.       Posterior tibial pulses are 2+ on the right  side, and 2+ on the left side.       Capillary refill less than 3 seconds  Pulmonary/Chest: Effort normal and breath sounds normal.  Musculoskeletal: He exhibits tenderness. He exhibits no edema.       Left knee: He exhibits normal range of motion, no swelling, no effusion, no ecchymosis, no deformity, no laceration, no erythema, normal alignment, no LCL laxity, normal patellar mobility, no bony tenderness, normal meniscus and no MCL laxity. no tenderness found.       ROM: Full range of motion with pain of the left knee No pain to palpation of the spinous processes or paraspinal muscles in the C-spine, T-spine or L-spine - full range of motion of entire spine  Neurological: He is alert. Coordination normal. GCS eye subscore is 4. GCS verbal subscore is 5. GCS motor subscore is 6.  Reflex Scores:      Patellar reflexes are 2+ on the right side and 2+ on the left side.      Achilles reflexes are 2+ on the right side and 2+ on the left side.      Sensation intact to dull and sharp in the entire left extremity Strength 5 out of 5 left toes, left ankle left knee and left hip Patient ambulatory without difficulty but states that it hurts the left knee.  Skin: Skin is warm and dry. He is not diaphoretic.    ED Course  Procedures (including critical care time)  Labs Reviewed - No data to display Dg Knee Complete 4 Views Left  09/14/2012  *RADIOLOGY REPORT*  Clinical Data: Left knee pain  LEFT KNEE - COMPLETE 4+ VIEW  Comparison: None.  Findings: Normal alignment without fracture or effusion.  Preserved joint spaces.  No significant arthropathy or degenerative process.  IMPRESSION: No acute osseous finding   Original Report Authenticated By: Judie Petit. Shick, M.D.      1. Chronic knee pain       MDM  Daryl Campbell presents with chronic L knee pain after a fall.  Patient unable to distinguish today's pain from chronic pain.  With reports of new trauma will obtain knee x-ray  though I doubt fracture.   Discussed with patient up front I will not be prescribing narcotic pain medication.  Left knee x-ray without acute osseous finding, arthropathy or degenerative process.  Patient has been here many times in the past complaining of chronic knee pain.  Syndrome with anti-inflammatories and knee brace.  Patient has been advised to followup with primary care physician or orthopedist.  I have also discussed reasons to return immediately to the ER.  Patient expresses understanding and agrees with plan.  1. Medications: Naprosyn, usual home medications  2. Treatment: rest, drink plenty of fluids, rest, ice, use knee brace  3. Follow Up: Please followup with your primary doctor for discussion of your diagnoses and further evaluation after today's visit; if you do not have a primary care doctor use the resource guide provided to find one; followup with primary care provider or with orthopedics.         Dahlia Client Cameran Pettey, PA-C 09/14/12 1414

## 2012-09-14 NOTE — ED Notes (Signed)
Pt arrived by [facility] for left knee pain, had a fall two weeks ago. Ambulatory with ems.

## 2012-09-14 NOTE — Progress Notes (Signed)
Orthopedic Tech Progress Note Patient Details:  Daryl Campbell [birth year] [id number]  Ortho Devices Type of Ortho Device: Knee Sleeve Ortho Device/Splint Location: LEFT KNEE SUPPORT Ortho Device/Splint Interventions: Application   Shawnie Pons 09/14/2012, 2:19 PM

## 2012-09-14 NOTE — ED Notes (Signed)
Er staff is attempting to find someone to pick up the pt, no answer at number pt is providing, called multiple times, pt became angry and began yelling at staff, edpa and gpd aware. gpd to bedside and is assisting pt to contact someone at his residence

## 2012-09-15 NOTE — ED Provider Notes (Signed)
Medical screening examination/treatment/procedure(s) were performed by non-physician practitioner and as supervising physician I was immediately available for consultation/collaboration.   Laray Anger, DO 09/15/12 1021

## 2012-09-19 ENCOUNTER — Encounter (HOSPITAL_COMMUNITY): Payer: Self-pay | Admitting: Emergency Medicine

## 2012-09-19 ENCOUNTER — Emergency Department (HOSPITAL_COMMUNITY)
Admission: EM | Admit: 2012-09-19 | Discharge: 2012-09-22 | Disposition: A | Discharge status: Skilled Nursing Facility | Payer: Medicare Other | Attending: Dermatology | Admitting: Dermatology

## 2012-09-19 DIAGNOSIS — IMO0002 Reserved for concepts with insufficient information to code with codable children: Secondary | ICD-10-CM | POA: Insufficient documentation

## 2012-09-19 DIAGNOSIS — Z791 Long term (current) use of non-steroidal anti-inflammatories (NSAID): Secondary | ICD-10-CM | POA: Insufficient documentation

## 2012-09-19 DIAGNOSIS — F39 Unspecified mood [affective] disorder: Secondary | ICD-10-CM | POA: Insufficient documentation

## 2012-09-19 DIAGNOSIS — Z8619 Personal history of other infectious and parasitic diseases: Secondary | ICD-10-CM | POA: Insufficient documentation

## 2012-09-19 DIAGNOSIS — Z8669 Personal history of other diseases of the nervous system and sense organs: Secondary | ICD-10-CM | POA: Insufficient documentation

## 2012-09-19 DIAGNOSIS — Z79899 Other long term (current) drug therapy: Secondary | ICD-10-CM | POA: Insufficient documentation

## 2012-09-19 DIAGNOSIS — F209 Schizophrenia, unspecified: Secondary | ICD-10-CM | POA: Insufficient documentation

## 2012-09-19 DIAGNOSIS — S0100XA Unspecified open wound of scalp, initial encounter: Secondary | ICD-10-CM | POA: Insufficient documentation

## 2012-09-19 DIAGNOSIS — F17200 Nicotine dependence, unspecified, uncomplicated: Secondary | ICD-10-CM | POA: Insufficient documentation

## 2012-09-19 DIAGNOSIS — I10 Essential (primary) hypertension: Secondary | ICD-10-CM | POA: Insufficient documentation

## 2012-09-19 DIAGNOSIS — Z7982 Long term (current) use of aspirin: Secondary | ICD-10-CM | POA: Insufficient documentation

## 2012-09-19 LAB — ETHANOL: Alcohol, Ethyl (B): <11 mg/dL (ref 0–11)

## 2012-09-19 LAB — RAPID URINE DRUG SCREEN, HOSP PERFORMED
Amphetamines: NOT DETECTED
Barbiturates: NOT DETECTED
Benzodiazepines: NOT DETECTED
Cocaine: NOT DETECTED
Opiates: NOT DETECTED
Tetrahydrocannabinol: NOT DETECTED

## 2012-09-19 LAB — POCT I-STAT, CHEM 8
Calcium, Ion: 1.27 mmol/L (ref 1.13–1.30)
Creatinine, Ser: 1 mg/dL (ref 0.50–1.35)
Glucose, Bld: 95 mg/dL (ref 70–99)
Hemoglobin: 14.3 g/dL (ref 13.0–17.0)
TCO2: 26 mmol/L (ref 0–100)

## 2012-09-19 MED ORDER — ACETAMINOPHEN 325 MG PO TABS
650.0000 mg | ORAL_TABLET | ORAL | Status: DC | PRN
  Administered 2012-09-19 – 2012-09-20 (×2): 650 mg via ORAL
  Filled 2012-09-19 (×2): qty 2

## 2012-09-19 MED ORDER — ONDANSETRON HCL 4 MG PO TABS: 4.0000 mg | ORAL_TABLET | Freq: Three times a day (TID) | ORAL | Status: DC | PRN

## 2012-09-19 MED ORDER — LORAZEPAM 1 MG PO TABS
1.0000 mg | ORAL_TABLET | Freq: Three times a day (TID) | ORAL | Status: DC | PRN
  Administered 2012-09-19 – 2012-09-21 (×3): 1 mg via ORAL
  Filled 2012-09-19 (×3): qty 1

## 2012-09-19 MED ORDER — IBUPROFEN 600 MG PO TABS
600.0000 mg | ORAL_TABLET | Freq: Three times a day (TID) | ORAL | Status: DC | PRN
  Administered 2012-09-19 – 2012-09-21 (×2): 600 mg via ORAL
  Filled 2012-09-19: qty 1
  Filled 2012-09-19: qty 3
  Filled 2012-09-19: qty 1

## 2012-09-19 MED ORDER — ALUM & MAG HYDROXIDE-SIMETH 200-200-20 MG/5ML PO SUSP
30.0000 mL | ORAL | Status: DC | PRN
  Administered 2012-09-21: 30 mL via ORAL
  Filled 2012-09-19: qty 30
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
## 2012-09-19 NOTE — ED Notes (Signed)
Patient is talking outloud having a 2 way  conversation with GOD.
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
## 2012-09-20 MED ORDER — SERTRALINE HCL 50 MG PO TABS
100.0000 mg | ORAL_TABLET | Freq: Every day | ORAL | Status: DC
  Administered 2012-09-20 – 2012-09-22 (×3): 100 mg via ORAL
  Filled 2012-09-20 (×2): qty 1
  Filled 2012-09-20 (×2): qty 2

## 2012-09-20 MED ORDER — LEVETIRACETAM 500 MG PO TABS
500.0000 mg | ORAL_TABLET | Freq: Two times a day (BID) | ORAL | Status: DC
Start: 2012-09-20 — End: 2012-09-22
  Administered 2012-09-20 – 2012-09-22 (×4): 500 mg via ORAL
  Filled 2012-09-20 (×5): qty 1

## 2012-09-20 MED ORDER — ASPIRIN EC 81 MG PO TBEC
81.0000 mg | DELAYED_RELEASE_TABLET | Freq: Every day | ORAL | Status: DC
  Administered 2012-09-20 – 2012-09-22 (×3): 81 mg via ORAL
  Filled 2012-09-20 (×3): qty 1

## 2012-09-20 MED ORDER — DOXYCYCLINE HYCLATE 100 MG PO TABS
100.0000 mg | ORAL_TABLET | Freq: Two times a day (BID) | ORAL | Status: DC
Start: 2012-09-20 — End: 2012-09-22
  Administered 2012-09-20 – 2012-09-22 (×4): 100 mg via ORAL
  Filled 2012-09-20 (×4): qty 1

## 2012-09-20 MED ORDER — LITHIUM CARBONATE 300 MG PO CAPS
300.0000 mg | ORAL_CAPSULE | Freq: Two times a day (BID) | ORAL | Status: DC
  Administered 2012-09-21 – 2012-09-22 (×3): 300 mg via ORAL
  Filled 2012-09-20 (×3): qty 1

## 2012-09-20 MED ORDER — ADULT MULTIVITAMIN W/MINERALS CH
1.0000 | ORAL_TABLET | Freq: Every day | ORAL | Status: DC
  Administered 2012-09-20 – 2012-09-22 (×3): 1 via ORAL
  Filled 2012-09-20 (×3): qty 1

## 2012-09-20 MED ORDER — HALOPERIDOL 5 MG PO TABS
5.0000 mg | ORAL_TABLET | Freq: Every day | ORAL | Status: DC
  Administered 2012-09-20 – 2012-09-21 (×2): 5 mg via ORAL
  Filled 2012-09-20 (×2): qty 1

## 2012-09-20 MED ORDER — VITAMIN B-12 1000 MCG PO TABS
1000.0000 ug | ORAL_TABLET | Freq: Every day | ORAL | Status: DC
  Administered 2012-09-20 – 2012-09-22 (×3): 1000 ug via ORAL
  Filled 2012-09-20 (×3): qty 1

## 2012-09-20 MED ORDER — POLYETHYLENE GLYCOL 3350 17 G PO PACK
17.0000 g | PACK | Freq: Every day | ORAL | Status: DC | PRN
  Filled 2012-09-20: qty 1

## 2012-09-20 MED ORDER — ASPIRIN EC 81 MG PO TBEC: 81.0000 mg | DELAYED_RELEASE_TABLET | Freq: Every day | ORAL | Status: DC

## 2012-09-20 MED ORDER — CLONAZEPAM 1 MG PO TABS
1.0000 mg | ORAL_TABLET | Freq: Every day | ORAL | Status: DC
  Administered 2012-09-20 – 2012-09-21 (×2): 1 mg via ORAL
  Filled 2012-09-20 (×2): qty 1

## 2012-09-20 MED ORDER — HALOPERIDOL DECANOATE 100 MG/ML IM SOLN
200.0000 mg | INTRAMUSCULAR | Status: DC
  Administered 2012-09-20: 200 mg via INTRAMUSCULAR
  Filled 2012-09-20: qty 2

## 2012-09-20 MED ORDER — VITAMIN B-1 100 MG PO TABS
100.0000 mg | ORAL_TABLET | Freq: Every day | ORAL | Status: DC
  Administered 2012-09-20 – 2012-09-22 (×3): 100 mg via ORAL
  Filled 2012-09-20 (×3): qty 1

## 2012-09-20 MED ORDER — VITAMIN D3 25 MCG (1000 UNIT) PO TABS
1000.0000 [IU] | ORAL_TABLET | Freq: Every day | ORAL | Status: DC
  Administered 2012-09-20 – 2012-09-22 (×3): 1000 [IU] via ORAL
  Filled 2012-09-20 (×3): qty 1

## 2012-09-20 MED ORDER — LITHIUM CARBONATE 300 MG PO TABS: 300.0000 mg | ORAL_TABLET | Freq: Two times a day (BID) | ORAL | Status: DC

## 2012-09-20 MED ORDER — MELOXICAM 15 MG PO TABS
15.0000 mg | ORAL_TABLET | Freq: Every day | ORAL | Status: DC
  Administered 2012-09-20 – 2012-09-22 (×3): 15 mg via ORAL
  Filled 2012-09-20 (×3): qty 1

## 2012-09-20 MED ORDER — FOLIC ACID 1 MG PO TABS
1.0000 mg | ORAL_TABLET | Freq: Every day | ORAL | Status: DC
  Administered 2012-09-20 – 2012-09-22 (×3): 1 mg via ORAL
  Filled 2012-09-20 (×3): qty 1

## 2012-09-20 MED ORDER — ACETAMINOPHEN 325 MG PO TABS: 650.0000 mg | ORAL_TABLET | Freq: Four times a day (QID) | ORAL | Status: DC | PRN

## 2012-09-20 NOTE — ED Notes (Signed)
Up to the bathroom to shower and change  

## 2012-09-20 NOTE — ED Notes (Signed)
Patient is resting comfortably. 
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
## 2012-09-20 NOTE — BHH Counselor (Signed)
Patient has been declined at [facility] at this time by Serena Colonel NP. Patient does not meet criteria for inpatient admission.

## 2012-09-20 NOTE — ED Provider Notes (Signed)
[name] is a 62 y.o. male who is being evaluated for confusion and inappropriate behavior. He lives in a care facility. He is calm, cooperative here. He was assessed by ACT, who felt that he was stable for discharge. I will have ACT andsocial work evaluate him for return to his current facility versus assessing the requirement for placement at another facility.  Flint Melter, MD 09/20/12 [phone]

## 2012-09-20 NOTE — ED Notes (Signed)
Attempted to reach nursing home that pt is from, given number to administration no answer. Left message and will try back as well.

## 2012-09-20 NOTE — ED Notes (Signed)
Phone number of administrator [phone]

## 2012-09-21 NOTE — ED Notes (Signed)
Pt up to nursing station asking for Bible

## 2012-09-21 NOTE — ED Notes (Signed)
Pt is up for discharge. Act team called to make sure pt was able to go back to facility [facility]. Act team spoke with Ledell Peoples and she said to go ahead and send him back.  The Nichelle called and said she spoke with her director and they said no he can not go back. I informed charge nurse. Charge nurse called Child psychotherapist. Social worker spoke with facility Directors son states she needs to speak with pt guardian before he can come back. Social worker atempted to call but did.

## 2012-09-21 NOTE — Progress Notes (Addendum)
CSW was contacted by staff to assist with Pt d/c back to facility. Pt is ready for d/c and the facility was stating to staff that they could not accept the Pt back.   CSW spoke with the Administrators son (Roddie Ramcharan) of facility and requested the facility accept Pt back.   Mr. Lardizabal stated that the Pt is a ward of the state and that the guardian would need to be located prior to d/c. CSW then requested that the facility provide contact information for the guardian so that d/c planning could be completed and Pt return to the facility today. Mr. Gatley stated that he would contact the facility (he was currently traveling) and would call ED back with contact information.   CSW awaiting guardian contact information for d/c planning.   Leron Croak, LCSWA [employer] [phone]

## 2012-09-21 NOTE — Progress Notes (Signed)
CSW contacted ED Officer concerning charges that the facility might have placed on the Pt at the time of admission.   Pt was charged at the time of admission for the incidence that caused his injury to his forehead, as well as IVC'd.   Pt is no longer under IVC and cleared to return to [facility].   DSS Ms. Nehemiah Massed was contacted concerning Pt's charges and is checking to see if they can have Pt go back in the home post charges.   DSS worker is to call ED CSW back concerning Pt's d/c plan.   Leron Croak, LCSWA [employer] [phone]

## 2012-09-21 NOTE — ED Notes (Signed)
Pt telepysch ordered

## 2012-09-21 NOTE — ED Notes (Signed)
Spoke to [facility] and charge nurse of ED and after making several attempts to contact group home information was provided that DSS guardian must be at home for patient to be received back in the group home. Follow-up to and completion of discharge will be completed in am with social worker and contact of the DSS guardian of the patient.

## 2012-09-21 NOTE — ED Provider Notes (Addendum)
Patient continues to be calm, and cooperative   Patient has not criteria for admission to a psychiatric facility.  Tele-psychiatric consultation ordered today  Social work consultation ordered  Flint Melter, MD 09/21/12 [phone]  He has been seen by Telepsych, who has diagnosed chronic paranoid schizophrenia, and feels that he can go back to his group home. The group home, was contacted by social work and they are tentatively accepting him, with the final decision to be made after consultation with his guardian.    Flint Melter, MD 09/21/12 1159

## 2012-09-21 NOTE — ED Notes (Signed)
Pt up to bathroom.

## 2012-09-21 NOTE — ED Notes (Signed)
Attempting to call the group home in relation to discharging patient back to his home. Patient

## 2012-09-21 NOTE — Progress Notes (Signed)
CSW spoke with Daryl Campbell concerning her consultation with her supervisor about this Daryl Campbell.    DSS supervisor is aware of this Daryl Campbell and it is the understanding of that supervisor that the Daryl Campbell is being worked up for an alternate facility but the hold up has been that the administrator has not been returning phone calls.   DSS worker stated that according to her supervisor, Daryl Campbell should be able to return to facility and the facility should be contacted to arrange d/c.   CSW will notify ED Officer and Charge Nurse to assist.   Leron Croak, [employer] [phone]

## 2012-09-21 NOTE — Progress Notes (Signed)
CSW was contacted by ED Pt nurse concerning contact name and phone information to move forward with d/c planning.   CSW noted guardian name is:   Daryl Campbell [facility] DSS Guardian) [phone] (Office)   CSW attempted to contact Guardian using the above number and was unsuccessful. Number given by facility was an office number and no emergency contact number was provided.   CSW contacted the facility back [facility] ([phone]---spoke with Daryl Campbell) to obtain a different number. Facility stated that there was no other number given. CSW requested additional contact information for the facility administrator and administrators son.   CSW obtained these numbers:  Daryl Campbell (Admin.'s son) [phone]  Daryl Campbell  (Admin.)  [phone] hm  [phone] cell    CSW attempted numerous times to contact both contacts with no success. CSW left several messages and received no call backs.   CSW then spoke with ED Officer to gain assistance in contacting facility administrator and/or DSS Guardian.   Officer spoke with the facility Daryl Campbell) concerning the seriousness of the situation and that he needed to speak with the administrator as soon as possible. Officer was unsuccessful in speaking with Production designer, theatre/television/film. ED Officer then obtained a emergent DSS number for contacting state guardian for assistance in returning Pt to facility. ED officer spoke with Daryl Campbell [facility] DSS On [phone]) and explained circumstances. DSS will assist in returning Pt back into home.   CSW was able to speak with DSS worker to further explain the situation and Ms. Daryl Campbell will be attempting to reach administrator and contact ED CSW back with an update.   CSW to follow.   Daryl Campbell, LCSWA [employer] [phone]

## 2012-09-22 NOTE — ED Notes (Signed)
Staff called from [facility] and stated that they forgot to get patient medications from Pharmacy. Staff stated that they would come and get medication between the hours of 10:00 am- 11:00 am.

## 2012-09-22 NOTE — ED Notes (Addendum)
Patient discharge via ambulatory with steady gait.  

## 2012-09-22 NOTE — Progress Notes (Signed)
CSW attempted to reach [facility] [phone], number continues to be busy.   CSW attempted call Thom Ollinger at [phone] and was unable to leave a message due to voice mail box being full.  CSW attempted to call Chen Saadeh at home number [phone] and left message.   Catha Gosselin, LCSWA  [phone] .09/22/2012 1644pm

## 2012-09-22 NOTE — ED Provider Notes (Signed)
8:38 AM Patient asleep.  Per RN, no acute events overnight.  Patient has 1/19 psych recommendation of IVC reversal.  He is awaiting placement with the assistance of SW.    Gerhard Munch, MD 09/22/12 [phone]

## 2012-09-22 NOTE — Progress Notes (Addendum)
CSW spoke with Pilar Plate [phone] regarding patient discharge. Per Mr. Whiteley, patient can return to group home if he can receive prolixin injection. Pt does not currently have prolixin listed on his current medications. Per Mr. Susman patient recieves prolixin injections from [facility] however pt has been currently refusing them which led to patient outburst. Mr. Monday agreed to send current [facility] to review. Once received csw will consult EDP regarding medication.   CSW called Mr. Kaufhold who stated that the medication mentioned was incorrect. Mr. Koepp requested that patient be given Haldol deconate injection which pt recieves monthly at [facility].   Per chart review pt received medication on 09/20/2012 at 21:48pm.   Pt accepted to return to [facility] and will provide transportation at approximately 8-830pm.     .Catha Gosselin, LCSWA  [phone] .09/22/2012 1743pm

## 2012-09-22 NOTE — Progress Notes (Addendum)
2:13pm- CSW has left 2 additional messages for Murvin Donning (DSS guardian) in regards to pt d/c plan.  CSW has also left additional messages for Brayn Eckstein, group home owner [phone]).  CSW has received no return call from either party.  CSW requested assistance from ED [employer].  ED [employer] stated he would have a car sent out to the group home to advise that pt is ready for d/c and they will need to come and pick pt up.     11:33am - CSW has attempted to call Murvin Donning [facility] DSS guardian) numerous times and received no answer.  CSW has left 2 different messages on vm since 8:30am.    CSW has also contacted [place] Organ [facility] DSS Facility Coordinator) for assistance.  CSW left message. His number is [phone].    CSW has also contacted Hoy Register (DSS on-call) with no success.  CSW left a message.    CSW reviewed this pt with AC.  AC stated that Terri had given her a number on a small piece of paper with the SW name on it from the group home.  [facility] today could not locate this information.  AC stated she would try to call Terri and ask.  CSW f/u with [facility] regarding the call and [facility] stated she did not get in touch with Terri.    Per Hayden Pedro, RN note at 11:35pm last evening [employer] was also in on the d/c plans for this pt.  CSW discussed the matter with the current ED [employer] officer.  [employer] officer was not up to date on situation.    CSW will continue attempts to reach parties and facilitate d/c.  Vickii Penna, LCSWA [phone]  Clinical Social Work

## 2012-09-29 NOTE — ED Provider Notes (Signed)
Medical screening examination/treatment/procedure(s) were conducted as a shared visit with non-physician practitioner(s) and myself.  I personally evaluated the patient during the encounter Pt with episode agitation at group home. Currently calm and alert. Labs, psych eval.   Suzi Roots, MD 09/29/12 [phone]

## 2012-11-28 ENCOUNTER — Ambulatory Visit
Admission: RE | Admit: 2012-11-28 | Discharge: 2012-11-28 | Disposition: A | Discharge status: Home / Self Care | Payer: No Typology Code available for payment source | Source: Ambulatory Visit | Attending: Family Medicine | Admitting: Family Medicine

## 2012-11-28 ENCOUNTER — Other Ambulatory Visit: Payer: Self-pay | Admitting: Family Medicine

## 2012-11-28 DIAGNOSIS — J209 Acute bronchitis, unspecified: Secondary | ICD-10-CM

## 2013-01-27 IMAGING — CR DG CHEST 2V
2 series · 2 of 2 positions shown · non-contrast
Comparison: 01/06/2012

CLINICAL DATA: Medical clearance.

CHEST - 2 VIEW

[w chest pa]
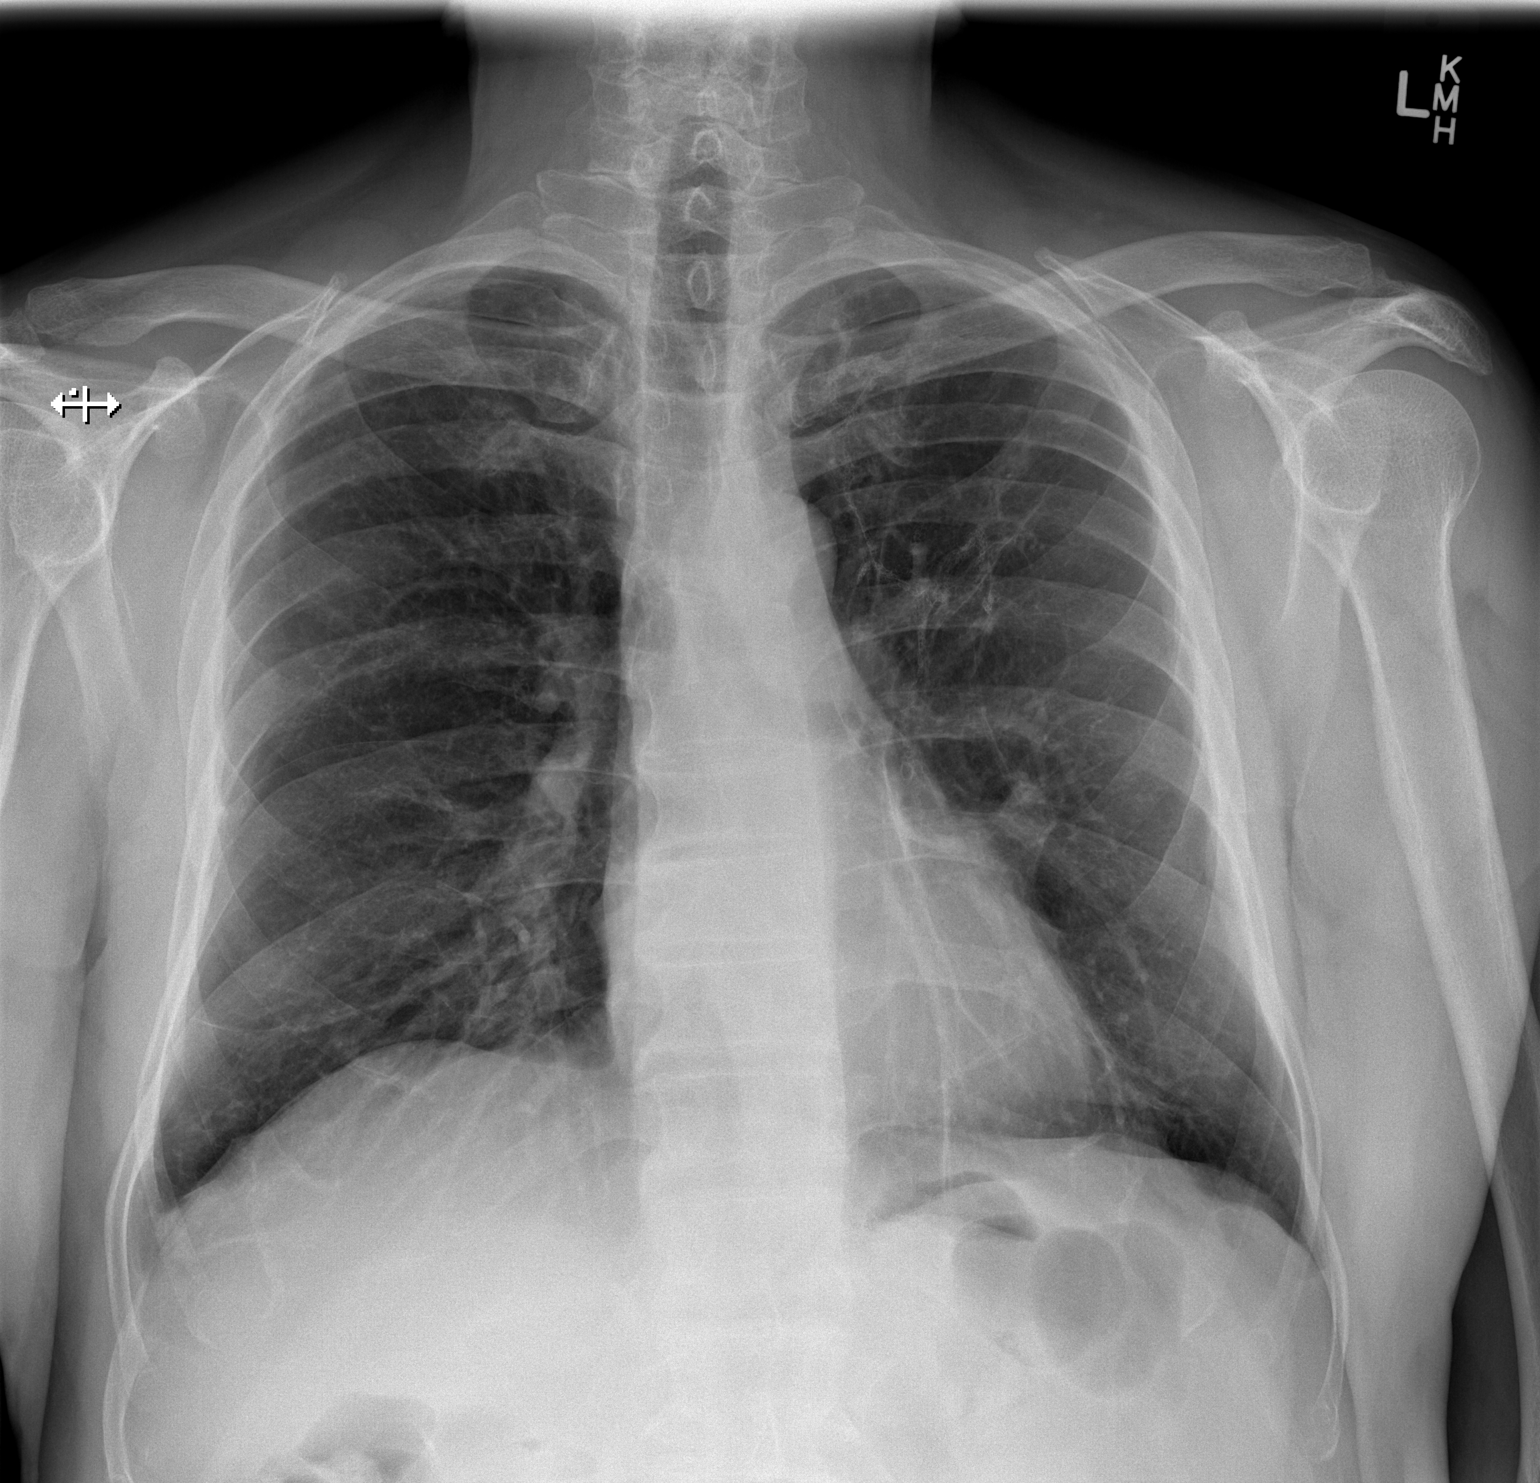

[w chest lat]
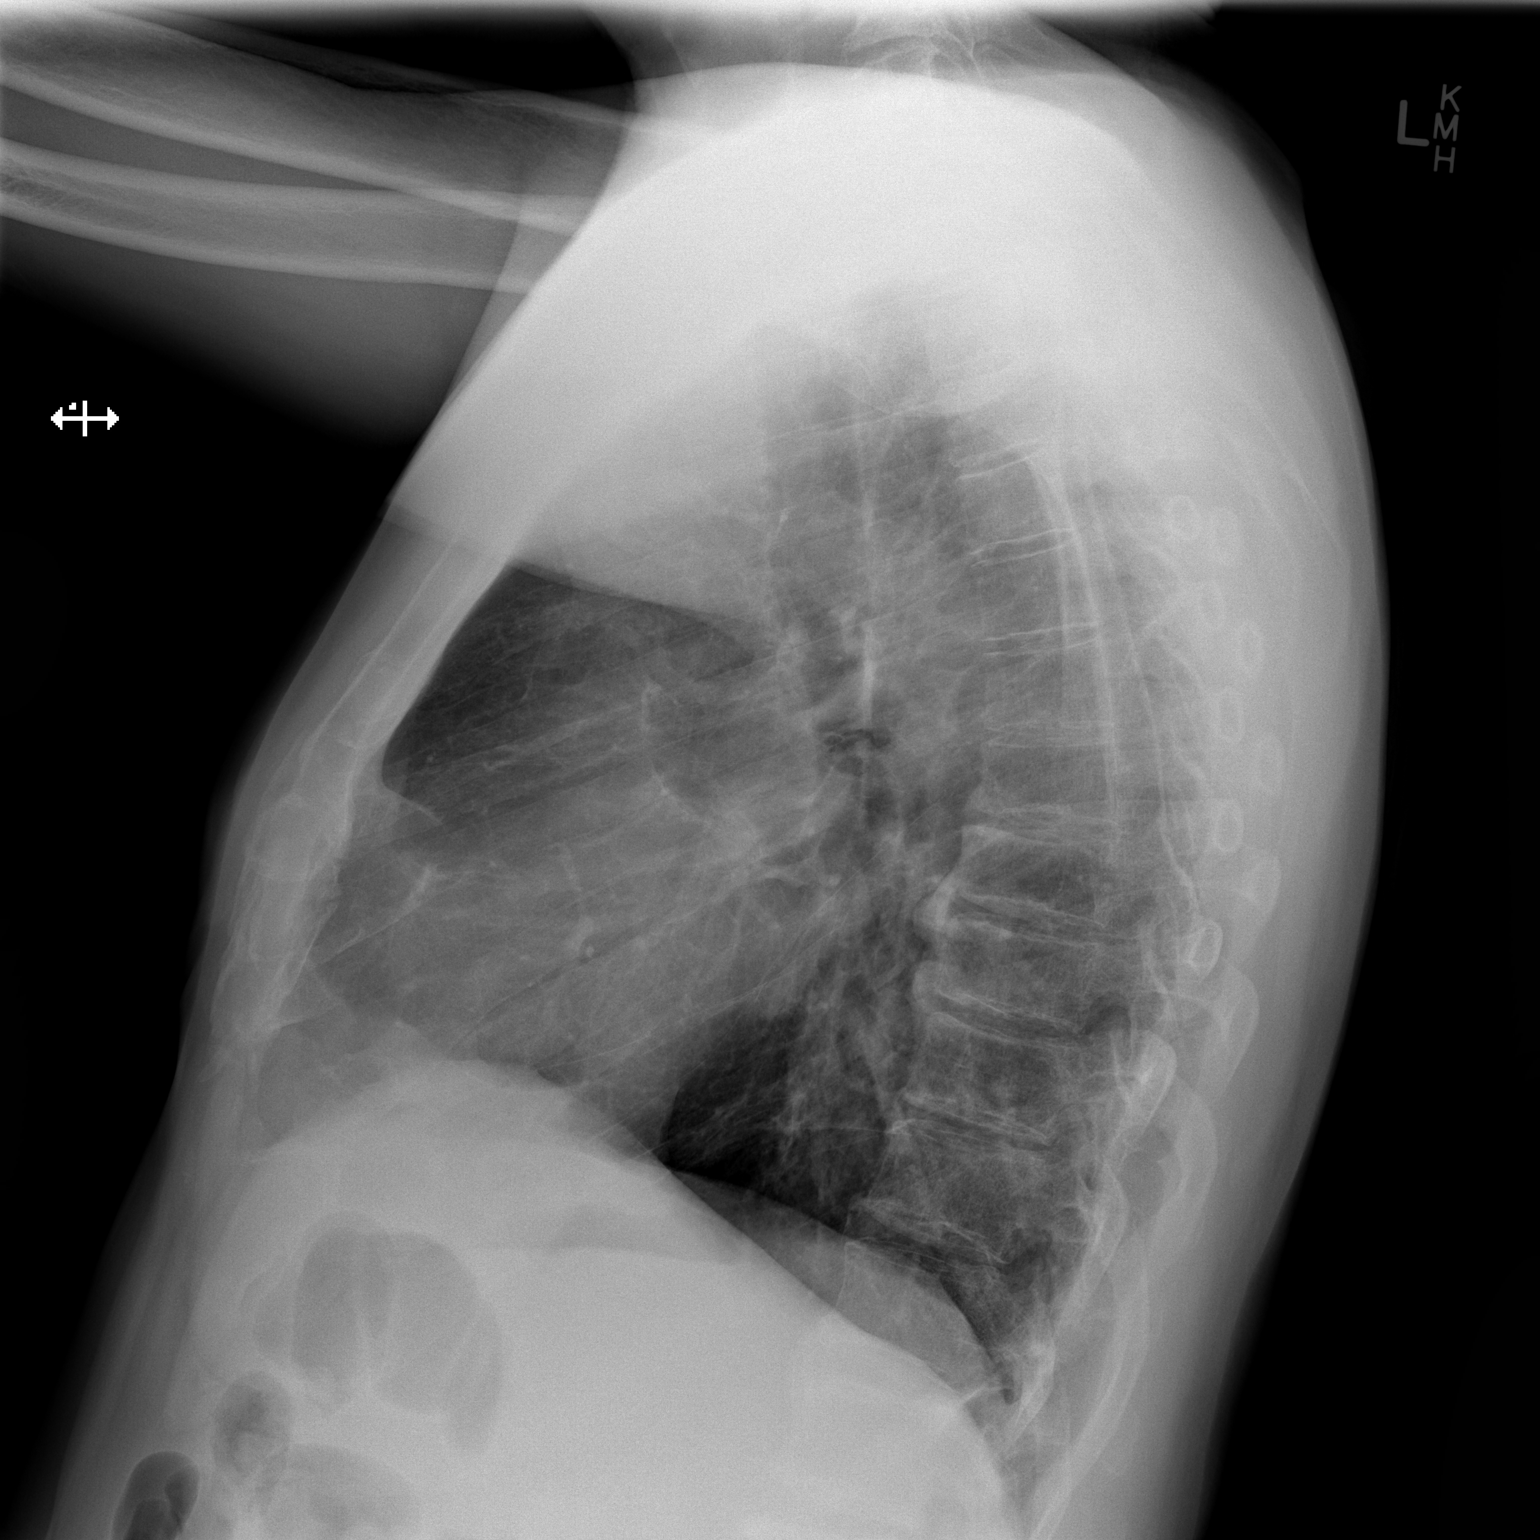

[2 of 2 positions shown; findings below may reference images not displayed]

FINDINGS: Linear densities in the lung bases and left upper lobe,
compatible with scarring.  No acute opacities or effusions.  Heart
is normal size.  No acute bony abnormality.
IMPRESSION: No acute cardiopulmonary disease.

## 2013-03-15 ENCOUNTER — Encounter (HOSPITAL_COMMUNITY): Payer: Self-pay

## 2013-03-15 ENCOUNTER — Emergency Department (HOSPITAL_COMMUNITY)
Admission: EM | Admit: 2013-03-15 | Discharge: 2013-03-16 | Disposition: A | Discharge status: Home / Self Care | Payer: Medicare Other | Attending: Emergency Medicine | Admitting: Emergency Medicine

## 2013-03-15 DIAGNOSIS — Z7982 Long term (current) use of aspirin: Secondary | ICD-10-CM | POA: Insufficient documentation

## 2013-03-15 DIAGNOSIS — Z8659 Personal history of other mental and behavioral disorders: Secondary | ICD-10-CM | POA: Insufficient documentation

## 2013-03-15 DIAGNOSIS — F17200 Nicotine dependence, unspecified, uncomplicated: Secondary | ICD-10-CM | POA: Insufficient documentation

## 2013-03-15 DIAGNOSIS — F319 Bipolar disorder, unspecified: Secondary | ICD-10-CM | POA: Insufficient documentation

## 2013-03-15 DIAGNOSIS — R443 Hallucinations, unspecified: Secondary | ICD-10-CM

## 2013-03-15 DIAGNOSIS — F209 Schizophrenia, unspecified: Secondary | ICD-10-CM

## 2013-03-15 DIAGNOSIS — F18121 Inhalant abuse with intoxication delirium: Secondary | ICD-10-CM | POA: Diagnosis present

## 2013-03-15 DIAGNOSIS — Z8619 Personal history of other infectious and parasitic diseases: Secondary | ICD-10-CM | POA: Insufficient documentation

## 2013-03-15 DIAGNOSIS — G40909 Epilepsy, unspecified, not intractable, without status epilepticus: Secondary | ICD-10-CM | POA: Insufficient documentation

## 2013-03-15 DIAGNOSIS — I10 Essential (primary) hypertension: Secondary | ICD-10-CM | POA: Insufficient documentation

## 2013-03-15 DIAGNOSIS — F1910 Other psychoactive substance abuse, uncomplicated: Secondary | ICD-10-CM | POA: Insufficient documentation

## 2013-03-15 DIAGNOSIS — R454 Irritability and anger: Secondary | ICD-10-CM | POA: Insufficient documentation

## 2013-03-15 DIAGNOSIS — Z79899 Other long term (current) drug therapy: Secondary | ICD-10-CM | POA: Insufficient documentation

## 2013-03-15 LAB — COMPREHENSIVE METABOLIC PANEL
Albumin: 4.2 g/dL (ref 3.5–5.2)
Alkaline Phosphatase: 91 U/L (ref 39–117)
BUN: 12 mg/dL (ref 6–23)
Creatinine, Ser: 1.03 mg/dL (ref 0.50–1.35)
Potassium: 3.5 mEq/L (ref 3.5–5.1)
Total Protein: 8.2 g/dL (ref 6.0–8.3)

## 2013-03-15 LAB — CBC WITH DIFFERENTIAL/PLATELET
Basophils Absolute: 0 10*3/uL (ref 0.0–0.1)
Basophils Relative: 0 % (ref 0–1)
Eosinophils Absolute: 0 10*3/uL (ref 0.0–0.7)
Hemoglobin: 15.3 g/dL (ref 13.0–17.0)
MCH: 31.4 pg (ref 26.0–34.0)
MCHC: 35.4 g/dL (ref 30.0–36.0)
Monocytes Relative: 6 % (ref 3–12)
Neutrophils Relative %: 79 % — ABNORMAL HIGH (ref 43–77)
Platelets: 202 10*3/uL (ref 150–400)
RDW: 12.2 % (ref 11.5–15.5)

## 2013-03-15 LAB — RAPID URINE DRUG SCREEN, HOSP PERFORMED
Amphetamines: NOT DETECTED
Cocaine: NOT DETECTED
Opiates: NOT DETECTED
Tetrahydrocannabinol: NOT DETECTED

## 2013-03-15 MED ORDER — ZOLPIDEM TARTRATE 5 MG PO TABS: 5.0000 mg | ORAL_TABLET | Freq: Every evening | ORAL | Status: DC | PRN

## 2013-03-15 MED ORDER — ACETAMINOPHEN 325 MG PO TABS: 650.0000 mg | ORAL_TABLET | Freq: Four times a day (QID) | ORAL | Status: DC | PRN

## 2013-03-15 MED ORDER — ASPIRIN EC 81 MG PO TBEC
81.0000 mg | DELAYED_RELEASE_TABLET | Freq: Every day | ORAL | Status: DC
  Filled 2013-03-15 (×2): qty 1

## 2013-03-15 MED ORDER — FOLIC ACID 1 MG PO TABS
1.0000 mg | ORAL_TABLET | Freq: Every day | ORAL | Status: DC
Start: 2013-03-15 — End: 2013-03-16
  Administered 2013-03-15: 1 mg via ORAL
  Filled 2013-03-15: qty 1

## 2013-03-15 MED ORDER — THIAMINE HCL 100 MG PO TABS: 100.0000 mg | ORAL_TABLET | Freq: Every day | ORAL | Status: DC

## 2013-03-15 MED ORDER — POLYETHYLENE GLYCOL 3350 17 G PO PACK
17.0000 g | PACK | Freq: Every day | ORAL | Status: DC | PRN
  Filled 2013-03-15: qty 1

## 2013-03-15 MED ORDER — SERTRALINE HCL 50 MG PO TABS
100.0000 mg | ORAL_TABLET | Freq: Every day | ORAL | Status: DC
  Administered 2013-03-15: 100 mg via ORAL
  Filled 2013-03-15: qty 2

## 2013-03-15 MED ORDER — LITHIUM CARBONATE 300 MG PO TABS: 300.0000 mg | ORAL_TABLET | Freq: Two times a day (BID) | ORAL | Status: DC

## 2013-03-15 MED ORDER — VITAMIN B-12 1000 MCG PO TABS
1000.0000 ug | ORAL_TABLET | Freq: Every day | ORAL | Status: DC
  Filled 2013-03-15 (×2): qty 1

## 2013-03-15 MED ORDER — HALOPERIDOL 5 MG PO TABS
5.0000 mg | ORAL_TABLET | Freq: Every day | ORAL | Status: DC
  Administered 2013-03-15: 5 mg via ORAL
  Filled 2013-03-15: qty 1

## 2013-03-15 MED ORDER — VITAMIN D3 25 MCG (1000 UNIT) PO TABS
1000.0000 [IU] | ORAL_TABLET | Freq: Every day | ORAL | Status: DC
  Filled 2013-03-15 (×2): qty 1

## 2013-03-15 MED ORDER — ADULT MULTIVITAMIN W/MINERALS CH
1.0000 | ORAL_TABLET | Freq: Every day | ORAL | Status: DC
  Administered 2013-03-15: 1 via ORAL
  Filled 2013-03-15: qty 1

## 2013-03-15 MED ORDER — ONDANSETRON HCL 4 MG PO TABS: 4.0000 mg | ORAL_TABLET | Freq: Three times a day (TID) | ORAL | Status: DC | PRN

## 2013-03-15 MED ORDER — CLONAZEPAM 1 MG PO TABS
1.0000 mg | ORAL_TABLET | Freq: Every day | ORAL | Status: DC
  Administered 2013-03-15: 1 mg via ORAL
  Filled 2013-03-15: qty 1

## 2013-03-15 MED ORDER — LITHIUM CARBONATE 300 MG PO CAPS
300.0000 mg | ORAL_CAPSULE | Freq: Two times a day (BID) | ORAL | Status: DC
  Administered 2013-03-16: 300 mg via ORAL
  Filled 2013-03-15: qty 1

## 2013-03-15 MED ORDER — LEVETIRACETAM 500 MG PO TABS
500.0000 mg | ORAL_TABLET | Freq: Two times a day (BID) | ORAL | Status: DC
  Filled 2013-03-15 (×3): qty 1

## 2013-03-15 MED ORDER — VITAMIN B-1 100 MG PO TABS
100.0000 mg | ORAL_TABLET | Freq: Every day | ORAL | Status: DC
  Administered 2013-03-15: 100 mg via ORAL
  Filled 2013-03-15: qty 1

## 2013-03-15 NOTE — ED Provider Notes (Signed)
History    CSN: [id number] Arrival date & time 03/15/13  1601  First MD Initiated Contact with Patient 03/15/13 1614     No chief complaint on file.  (Consider location/radiation/quality/duration/timing/severity/associated sxs/prior Treatment) The history is provided by the patient. No language interpreter was used.  Daryl Campbell is a 62 y/o M with PMHx of HTN, psychological disorder, Hep C, seizures, schizophrenia, bipolar disorder brought into the ED by [employer]. Patient reported that he thinks his neighbor called the cops on him because he was outside trying to get high on "rubber cement" - patient reported that it is his birthday today and that he wanted to get high. Patient stated that he was inhaling "rubber cement" through a paper bag - stated that "rubber cement" is a mixture of chemicals. When asked why he is here, patient stated that he does not know. Denied using illicit drugs. Reported that he does not drink alcohol and quit smoking many years ago. Stated that he used to smoke marijuana, but has not smoked in a long time. Patient reported that he was brought to [name] by [facility], but was told that there was no room left so came to [name]. Patient reported that he has a group that he goes to called [facility]. Reported that he wants help for detox. Denied chest pain, shortness of breath, difficulty breathing, abdominal symptoms, urinary symptoms, SI, HI, hallucinations.   Past Medical History  Diagnosis Date  . Hypertension   . Psychiatric disorder   . Hepatitis C   . Seizures   . Schizophrenia   . Bipolar affective    History reviewed. No pertinent past surgical history. History reviewed. No pertinent family history. History  Substance Use Topics  . Smoking status: Current Every Day Smoker -- 0.50 packs/day for 3 years    Types: Cigarettes  . Smokeless tobacco: Never Used  . Alcohol Use: No    Review of Systems  Constitutional: Negative for fever and chills.   HENT: Negative for sore throat, trouble swallowing, neck pain and neck stiffness.   Eyes: Negative for visual disturbance.  Respiratory: Negative for chest tightness and shortness of breath.   Cardiovascular: Negative for chest pain.  Gastrointestinal: Negative for nausea, vomiting, abdominal pain, diarrhea and constipation.  Genitourinary: Negative for dysuria, hematuria, decreased urine volume and difficulty urinating.  Musculoskeletal: Negative for arthralgias.  Neurological: Negative for dizziness, weakness, light-headedness and headaches.  Psychiatric/Behavioral: Negative for suicidal ideas, hallucinations and confusion. The patient is not nervous/anxious.        Detox request  All other systems reviewed and are negative.    Allergies  Review of patient's allergies indicates no known allergies.  Home Medications   Current Outpatient Rx  Name  Route  Sig  Dispense  Refill  . acetaminophen (TYLENOL) 325 MG tablet   Oral   Take 650 mg by mouth every 6 (six) hours as needed. For pain         . aspirin EC 81 MG tablet   Oral   Take 81 mg by mouth daily.         . cholecalciferol (VITAMIN D) 1000 UNITS tablet   Oral   Take 1,000 Units by mouth daily.         . clonazePAM (KLONOPIN) 1 MG tablet   Oral   Take 1 mg by mouth at bedtime.         . folic acid (FOLVITE) 1 MG tablet   Oral  Take 1 mg by mouth daily.         . haloperidol (HALDOL) 5 MG tablet   Oral   Take 5 mg by mouth at bedtime.         . haloperidol decanoate (HALDOL DECANOATE) 50 MG/ML injection   Intramuscular   Inject 200 mg into the muscle every 28 (twenty-eight) days.         Marland Kitchen levETIRAcetam (KEPPRA) 500 MG tablet   Oral   Take 500 mg by mouth 2 (two) times daily.         Marland Kitchen lithium 300 MG tablet   Oral   Take 300 mg by mouth 2 (two) times daily.         . meloxicam (MOBIC) 15 MG tablet   Oral   Take 15 mg by mouth daily.         . Multiple Vitamin (MULTIVITAMIN WITH  MINERALS) TABS   Oral   Take 1 tablet by mouth daily.         . polyethylene glycol (MIRALAX / GLYCOLAX) packet   Oral   Take 17 g by mouth daily as needed. For constipation         . sertraline (ZOLOFT) 100 MG tablet   Oral   Take 100 mg by mouth daily.         Marland Kitchen thiamine 100 MG tablet   Oral   Take 100 mg by mouth daily.         . vitamin B-12 (CYANOCOBALAMIN) 1000 MCG tablet   Oral   Take 1,000 mcg by mouth daily.          BP 110/73  Pulse 71  Temp(Src) 98.5 F (36.9 C) (Oral)  Resp 20  SpO2 97% Physical Exam  Nursing note and vitals reviewed. Constitutional: He is oriented to person, place, and time. He appears well-developed and well-nourished. No distress.  HENT:  Head: Normocephalic and atraumatic.  Eyes: Conjunctivae and EOM are normal. Pupils are equal, round, and reactive to light. Right eye exhibits no discharge. Left eye exhibits no discharge.  Neck: Normal range of motion. Neck supple.  Cardiovascular: Normal rate, regular rhythm and normal heart sounds.  Exam reveals no friction rub.   No murmur heard. Pulses:      Radial pulses are 2+ on the right side, and 2+ on the left side.  Pulmonary/Chest: Effort normal and breath sounds normal. No respiratory distress. He has no wheezes. He has no rales.  Abdominal: Soft. Bowel sounds are normal. He exhibits no distension. There is no tenderness. There is no rebound.  Musculoskeletal: Normal range of motion.  Neurological: He is alert and oriented to person, place, and time. No cranial nerve deficit. He exhibits normal muscle tone. Coordination normal.  Cranial nerves III-XII grossly intact  Skin: Skin is warm and dry. No rash noted. He is not diaphoretic. No erythema.  Psychiatric: He has a normal mood and affect. His behavior is normal. Thought content normal.  Lisp     ED Course  Procedures (including critical care time)  Patient becoming agitated while waiting for room in psych ED. Started yelling  about Jesus and God. Sat and spoke with patient about what was going on - patient reported that he has been seeing Jesus in his dreams and in reality. Stated that Jesus has been giving him strength. Stated that both him and Jesus and 5 letters in their name. Reported that Jesus comes to him during the day and gives  him strength and tells him good things.   Labs Reviewed  CBC WITH DIFFERENTIAL - Abnormal; Notable for the following:    Neutrophils Relative % 79 (*)    All other components within normal limits  COMPREHENSIVE METABOLIC PANEL - Abnormal; Notable for the following:    GFR calc non Af Amer 76 (*)    GFR calc Af Amer 88 (*)    All other components within normal limits  URINE RAPID DRUG SCREEN (HOSP PERFORMED)  ETHANOL   No results found. 1. Schizophrenia   2. Polysubstance abuse   3. Hallucination     MDM  Patient is 62 y/o M presenting to the ED with request for detox - stated that he wants to get his life straight - but, when asked about drug use patient denies all drug use.  Patient medically cleared and moved to psych ED. Discussed with Reita Cliche from ACT team who recommended Telepsych. Psych ED orders placed. Patient seen by Psych PA Eloisa Northern, cleared patient for discharge and recommended patient to follow up with NA regarding to decrease sniffing glue. Discussed case with Dr. Cheri Rous - cleared patient for discharge. Patient discharged. Discussed with patient to refrain from sniffing glue - reported that most of these glues are toxic and can be fatal. Discussed with patient to follow BH to seek aid in detox if he still wishes. Discussed with patient the dangers of illicit drug use. Discussed with patient to monitor symptoms and if symptoms are to worsen or change to report back to the ED.  Patient agreed to plan of care, understood, all questions answered.   Raymon Mutton, PA-C 03/15/13 2150  Raymon Mutton, PA-C 03/15/13 2228  Raymon Mutton, PA-C 03/15/13 2238

## 2013-03-15 NOTE — ED Notes (Signed)
Pt changed in blue scrubs. Pt searched and wanded by security.

## 2013-03-15 NOTE — ED Notes (Signed)
Patient has $5.63 placed in belonging bag via security [place].

## 2013-03-15 NOTE — Consult Note (Signed)
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 male.  HPI: See ED admission note and reason for consult  Past Medical History  Diagnosis Date  . Hypertension   . Psychiatric disorder   . Hepatitis C   . Seizures   . Schizophrenia   . Bipolar affective     History reviewed. No pertinent past surgical history.  History reviewed. No pertinent family history.  Social History:  reports that he has been smoking Cigarettes.  He has a 1.5 pack-year smoking history. He has never used smokeless tobacco. He reports that he uses illicit drugs. He reports that he does not drink alcohol.  Allergies: No Known Allergies  Medications: I have reviewed the patient's current medications.  Results for orders placed during the hospital encounter of 03/15/13 (from the past 48 hour(s))  CBC WITH DIFFERENTIAL     Status: Abnormal   Collection Time    03/15/13  5:20 PM      Result Value Range   WBC 9.3  4.0 - 10.5 K/uL   RBC 4.87  4.22 - 5.81 MIL/uL   Hemoglobin 15.3  13.0 - 17.0 g/dL   HCT 04.5  40.9 - 81.1 %   MCV 88.7  78.0 - 100.0 fL   MCH 31.4  26.0 - 34.0 pg   MCHC 35.4  30.0 - 36.0 g/dL   RDW 91.4  78.2 - 95.6 %   Platelets 202  150 - 400 K/uL   Neutrophils Relative % 79 (*) 43 - 77 %   Neutro Abs 7.3  1.7 - 7.7 K/uL   Lymphocytes Relative 15  12 - 46 %   Lymphs Abs 1.4  0.7 - 4.0 K/uL   Monocytes Relative 6  3 - 12 %   Monocytes Absolute 0.6  0.1 - 1.0 K/uL   Eosinophils Relative 0  0 - 5 %   Eosinophils Absolute 0.0  0.0 - 0.7 K/uL   Basophils Relative 0  0 - 1 %   Basophils  Absolute 0.0  0.0 - 0.1 K/uL  COMPREHENSIVE METABOLIC PANEL     Status: Abnormal   Collection Time    03/15/13  5:20 PM      Result Value Range   Sodium 138  135 - 145 mEq/L   Potassium 3.5  3.5 - 5.1 mEq/L   Chloride 103  96 - 112 mEq/L   CO2 25  19 - 32 mEq/L   Glucose, Bld 81  70 - 99 mg/dL   BUN 12  6 - 23 mg/dL   Creatinine, Ser 2.13  0.50 - 1.35 mg/dL   Calcium 08.6  8.4 - 57.8 mg/dL   Total Protein 8.2  6.0 - 8.3 g/dL   Albumin 4.2  3.5 - 5.2 g/dL   AST 31  0 - 37 U/L   ALT 31  0 - 53 U/L   Alkaline Phosphatase 91  39 - 117 U/L   Total Bilirubin 0.6  0.3 - 1.2 mg/dL   GFR calc non Af Amer 76 (*) >90 mL/min   GFR calc Af Amer 88 (*) >  90 mL/min   Comment:            The eGFR has been calculated     using the CKD EPI equation.     This calculation has not been     validated in all clinical     situations.     eGFR's persistently     <90 mL/min signify     possible Chronic Kidney Disease.  ETHANOL     Status: None   Collection Time    03/15/13  5:20 PM      Result Value Range   Alcohol, Ethyl (B) <11  0 - 11 mg/dL   Comment:            LOWEST DETECTABLE LIMIT FOR     SERUM ALCOHOL IS 11 mg/dL     FOR MEDICAL PURPOSES ONLY  URINE RAPID DRUG SCREEN (HOSP PERFORMED)     Status: None   Collection Time    03/15/13  6:10 PM      Result Value Range   Opiates NONE DETECTED  NONE DETECTED   Cocaine NONE DETECTED  NONE DETECTED   Benzodiazepines NONE DETECTED  NONE DETECTED   Amphetamines NONE DETECTED  NONE DETECTED   Tetrahydrocannabinol NONE DETECTED  NONE DETECTED   Barbiturates NONE DETECTED  NONE DETECTED   Comment:            DRUG SCREEN FOR MEDICAL PURPOSES     ONLY.  IF CONFIRMATION IS NEEDED     FOR ANY PURPOSE, NOTIFY LAB     WITHIN 5 DAYS.                LOWEST DETECTABLE LIMITS     FOR URINE DRUG SCREEN     Drug Class       Cutoff (ng/mL)     Amphetamine      1000     Barbiturate      200     Benzodiazepine   200     Tricyclics       300     Opiates           300     Cocaine          300     THC              50    No results found.  Review of Systems  Constitutional: Negative.   HENT: Negative.   Eyes: Negative.   Respiratory: Negative.   Cardiovascular: Negative.        Hx of hypertension  Gastrointestinal: Negative.        Hx of hep C  Genitourinary: Negative.   Musculoskeletal: Negative.   Skin: Negative.   Neurological: Positive for seizures.       No current seizure activity  Endo/Heme/Allergies: Negative.   Psychiatric/Behavioral: Positive for substance abuse. Negative for depression, suicidal ideas, hallucinations and memory loss. The patient does not have insomnia.        Hx of schizophrenia and Affective DO No hallucinations since glue effect has worn off   Blood pressure 110/73, pulse 71, temperature 98.5 F (36.9 C), temperature source Oral, resp. rate 20, SpO2 97.00%. Physical Exam  Constitutional: He is oriented to person, place, and time. He appears well-developed and well-nourished. No distress.  HENT:  Head: Normocephalic and atraumatic.  Right Ear: External ear normal.  Left Ear: External ear normal.  Nose: Nose normal.  Eyes: Conjunctivae and EOM are normal. Pupils are  equal, round, and reactive to light. Right eye exhibits no discharge. Left eye exhibits no discharge. No scleral icterus.  Neck: Normal range of motion. No JVD present. No tracheal deviation present.  Cardiovascular: Normal rate and regular rhythm.   Respiratory: Effort normal and breath sounds normal. No stridor. No respiratory distress. He has no wheezes. He exhibits no tenderness.  GI:  Deferred  Genitourinary:  Deferred  Musculoskeletal: Normal range of motion.  Lymphadenopathy:    He has no cervical adenopathy.  Neurological: He is alert and oriented to person, place, and time. No cranial nerve deficit. Coordination normal.  Skin: Skin is warm and dry. He is not diaphoretic.  Psychiatric: He has a normal mood and affect. His  speech is normal and behavior is normal. Thought content is not paranoid and not delusional. Cognition and memory are not impaired. He expresses impulsivity and inappropriate judgment. He expresses no homicidal and no suicidal ideation. He expresses no suicidal plans and no homicidal plans. He exhibits abnormal recent memory and abnormal remote memory.  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

## 2013-03-15 NOTE — BH Assessment (Signed)
BHH Assessment Progress Note  Pt denies having an active SA problem and is not in need of detox based on report ACT received from PA.  Pt is denying SI/HI and has hx of schizophrenia related illnesses but does not appear to be under any duress.  Pt reports he has not taken his medications in a while.  Pt reports "I want to get my life together."  ACT informed PA that it did not appear he needed ACT services and she agreed.  Recommend a Tele Psych for med mgt recommendations.  If pt changes his mind and appears to need some psych related services ACT can provide then ACT is glad to evaluate the pt.

## 2013-03-15 NOTE — ED Notes (Addendum)
He tells me that it is his birthday and he was "sniffing glue" earlier today.  He was brought to [place] by two [employer] officers, and is awake, alert and somewhat tearful at times.  He gives a vague account of "hearing voices--I couldn't understand them".  He states he lives in a group home.

## 2013-03-15 NOTE — ED Notes (Signed)
Pt transferred from triage, presents for medical clearance.  Pt  Reports today is his birthday, he was celebrating attempting to get high on cement glue.  Pt states he wants to go home, denies SI or HI or AV hallucinations.  Pt pleasant & cooperative.

## 2013-03-15 NOTE — ED Provider Notes (Signed)
Medical screening examination/treatment/procedure(s) were performed by non-physician practitioner and as supervising physician I was immediately available for consultation/collaboration.   Richardean Canal, MD 03/15/13 [phone]

## 2013-03-16 DIAGNOSIS — F200 Paranoid schizophrenia: Secondary | ICD-10-CM

## 2013-03-16 NOTE — BHH Counselor (Signed)
Pt to be discharged per recommendations of Maryjean Morn, PA-C. Group home contacted [phone] and voicemail message left. Writer also contacted group home owner-Ms. Davisw via cell phone and no answer. Lackey,Cheryl [phone] is patient's legal guardian at [employer]. She was informed of patient's discharge. She will assist with calling Ms. Malinak to have patient picked up from the ED. Pt to remain in the ED until group home owner picks patient up.

## 2013-03-16 NOTE — Progress Notes (Signed)
Patient Identification:  Daryl Campbell Date of Evaluation:  03/16/2013   History of Present Illness: Patient is 62 year old Caucasian male who was admitted due to bizarre behavior.  Apparently patient has been sniffing glue.  She's been experiencing hallucination paranoia and irritability.  Patient seen today and chart reviewed.  Patient is doing much better.  He denies any active or passive suicidal thoughts or homicidal thoughts.  Lives in a group home.  He has not a management problem in the psychiatric emergency room.  She's compliant with his medication.  He denied any side effects.  He denies any tremors or shakes.   Past Psychiatric History: See previous notes.   Past Medical History:     Past Medical History  Diagnosis Date  . Hypertension   . Psychiatric disorder   . Hepatitis C   . Seizures   . Schizophrenia   . Bipolar affective       History reviewed. No pertinent past surgical history.  Allergies: No Known Allergies  Current Medications:  Prior to Admission medications   Medication Sig Start Date End Date Taking? Authorizing Provider  acetaminophen (TYLENOL) 325 MG tablet Take 650 mg by mouth every 6 (six) hours as needed. For pain   Yes Historical Provider, MD  aspirin EC 81 MG tablet Take 81 mg by mouth daily.   Yes Historical Provider, MD  cholecalciferol (VITAMIN D) 1000 UNITS tablet Take 1,000 Units by mouth daily.   Yes Historical Provider, MD  clonazePAM (KLONOPIN) 1 MG tablet Take 1 mg by mouth at bedtime.   Yes Historical Provider, MD  folic acid (FOLVITE) 1 MG tablet Take 1 mg by mouth daily.   Yes Historical Provider, MD  haloperidol (HALDOL) 5 MG tablet Take 5 mg by mouth at bedtime.   Yes Historical Provider, MD  haloperidol decanoate (HALDOL DECANOATE) 50 MG/ML injection Inject 200 mg into the muscle every 28 (twenty-eight) days.   Yes Historical Provider, MD  levETIRAcetam (KEPPRA) 500 MG tablet Take 500 mg by mouth 2 (two) times daily.   Yes Historical  Provider, MD  lithium 300 MG tablet Take 300 mg by mouth 2 (two) times daily.   Yes Historical Provider, MD  meloxicam (MOBIC) 15 MG tablet Take 15 mg by mouth daily.   Yes Historical Provider, MD  Multiple Vitamin (MULTIVITAMIN WITH MINERALS) TABS Take 1 tablet by mouth daily.   Yes Historical Provider, MD  polyethylene glycol (MIRALAX / GLYCOLAX) packet Take 17 g by mouth daily as needed. For constipation   Yes Historical Provider, MD  sertraline (ZOLOFT) 100 MG tablet Take 100 mg by mouth daily.   Yes Historical Provider, MD  thiamine 100 MG tablet Take 100 mg by mouth daily.   Yes Historical Provider, MD  vitamin B-12 (CYANOCOBALAMIN) 1000 MCG tablet Take 1,000 mcg by mouth daily.   Yes Historical Provider, MD    Social History:    reports that he has been smoking Cigarettes.  He has a 1.5 pack-year smoking history. He has never used smokeless tobacco. He reports that he uses illicit drugs. He reports that he does not drink alcohol.   Family History:    History reviewed. No pertinent family history.  Mental Status Examination/Evaluation: Patient is order man who appears older than his his stated age.  His speech is rambling pressured but he's cooperative and pleasant.  His attention and concentration is fair.  He has any auditory or visual hallucination.  There were no paranoia or delusion obsession present at  this time.  There were no tremors or shakes present.  His thought processes rambling sometime.  He's alert and oriented x3.  His insight judgment and impulse control is okay.   DIAGNOSIS:   AXIS I   schizophrenia chronic paranoid type, inhalant intoxication now resolved   AXIS II  Deffered  AXIS III See medical notes.  AXIS IV other psychosocial or environmental problems and problems related to social environment  AXIS V 61-70 mild symptoms     Assessment/Plan: Recommend discharge to group home.  Followup with [facility].  Patient understands his disposition and discharge  planning.

## 2013-03-16 NOTE — BHH Counselor (Signed)
Received a call from patient's guardian at [employer]-Lackey,Cheryl [phone]. She provided a # for a staff member at [facility]. Sts that this staff member may be able to pick patient up from the ED.  The staff member is Mr. Alcide Goodness # [phone].  Writer contacted Mr. Alcide Goodness and he sts that he is longer working with patient. He will however, contact the owner and have her call [facility].

## 2013-05-19 IMAGING — CR DG ANKLE COMPLETE 3+V*R*
3 series · 3 of 3 positions shown · non-contrast
Comparison: None.

CLINICAL DATA: Foot pain, toe pain

RIGHT ANKLE - COMPLETE 3+ VIEW

[x ankle obl right (1 of 2)]
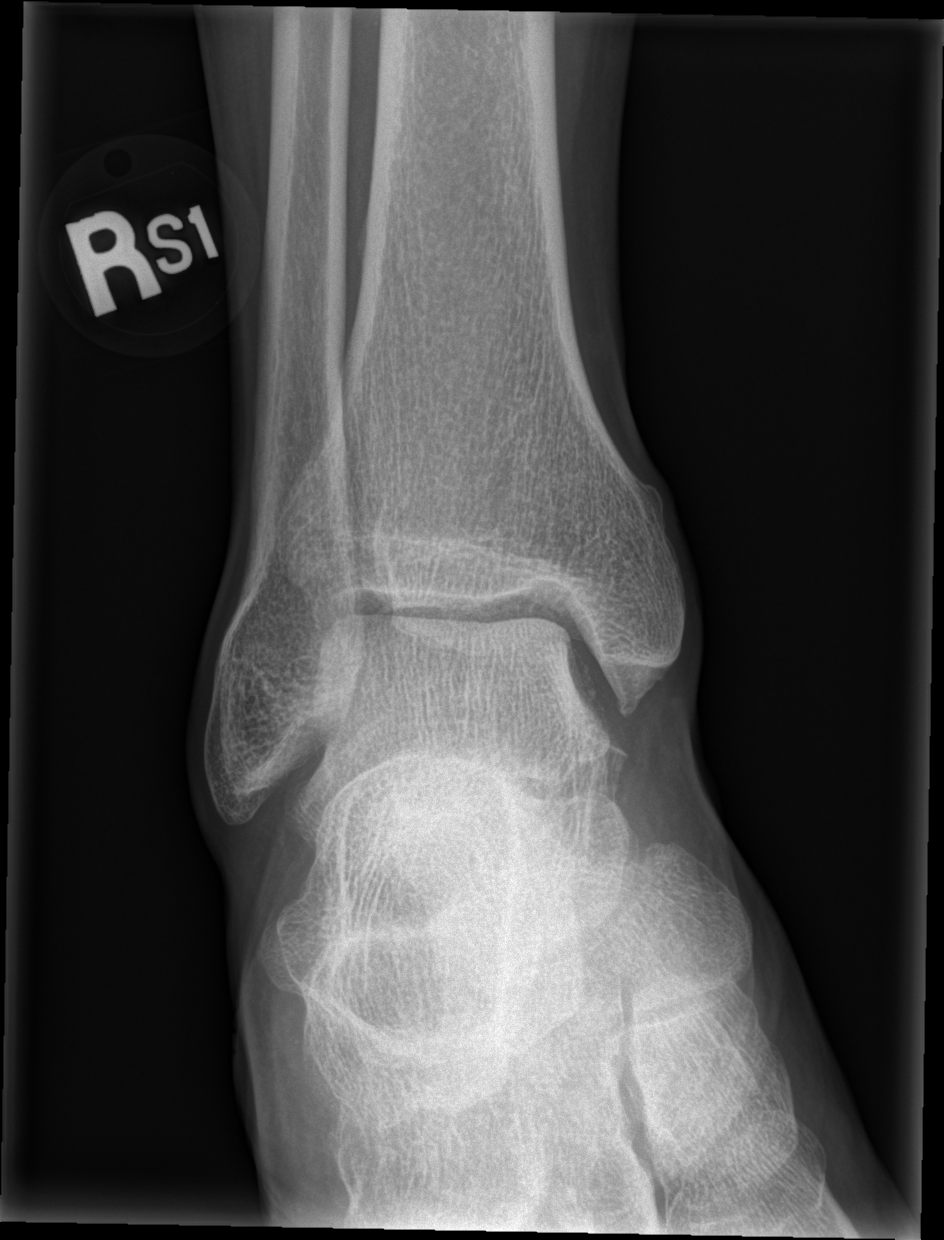

[x ankle obl right (2 of 2)]
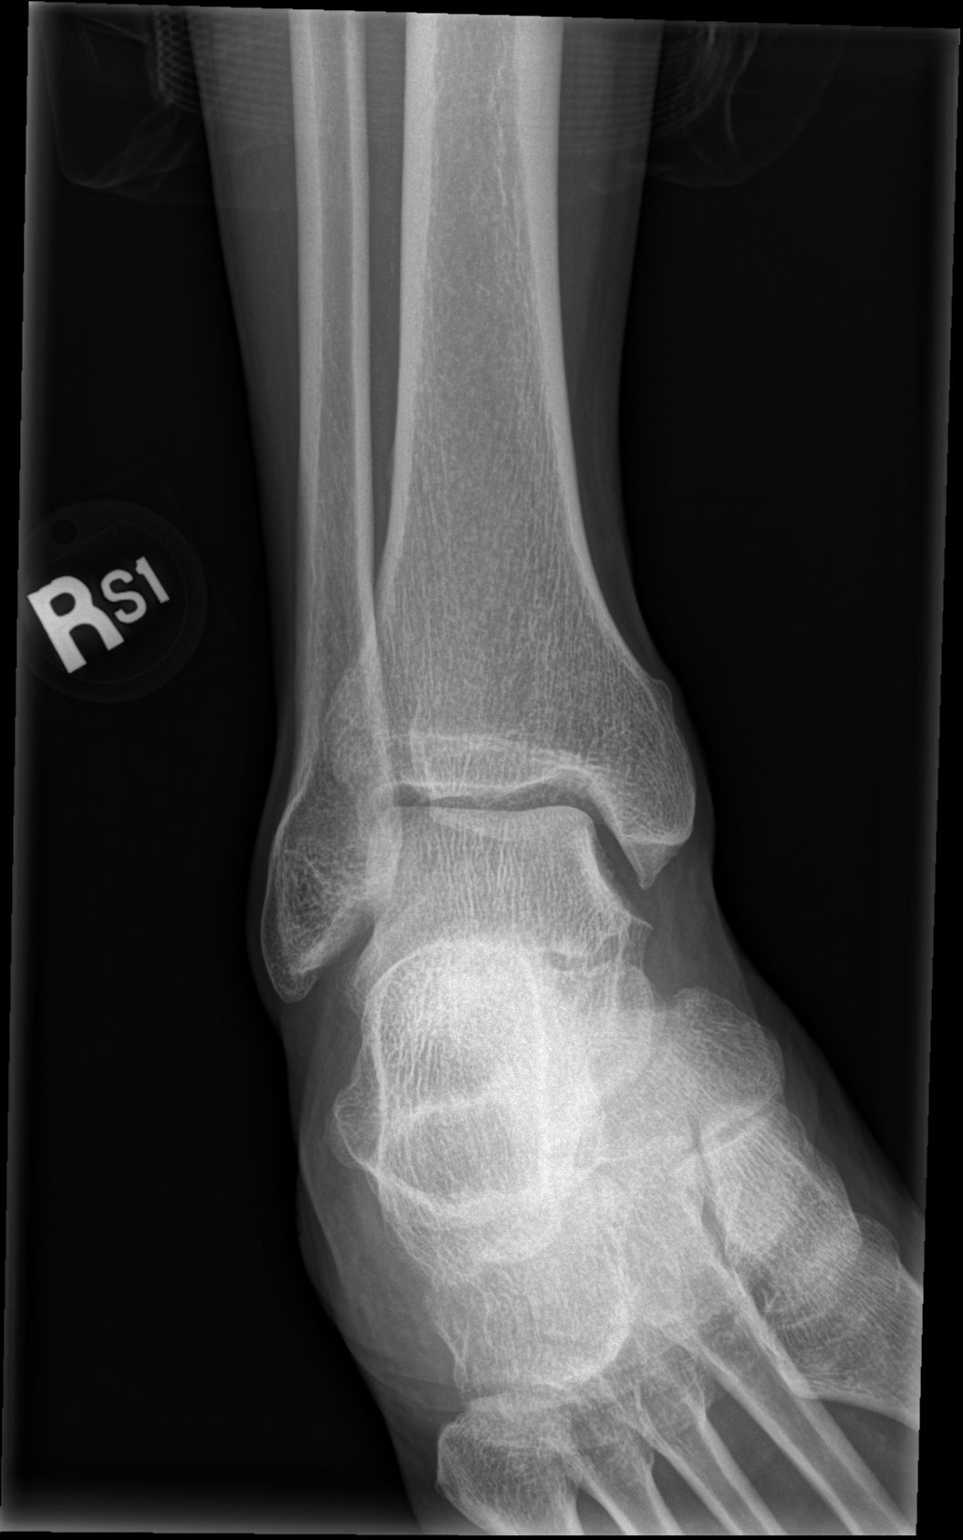

[x ankle lat right]
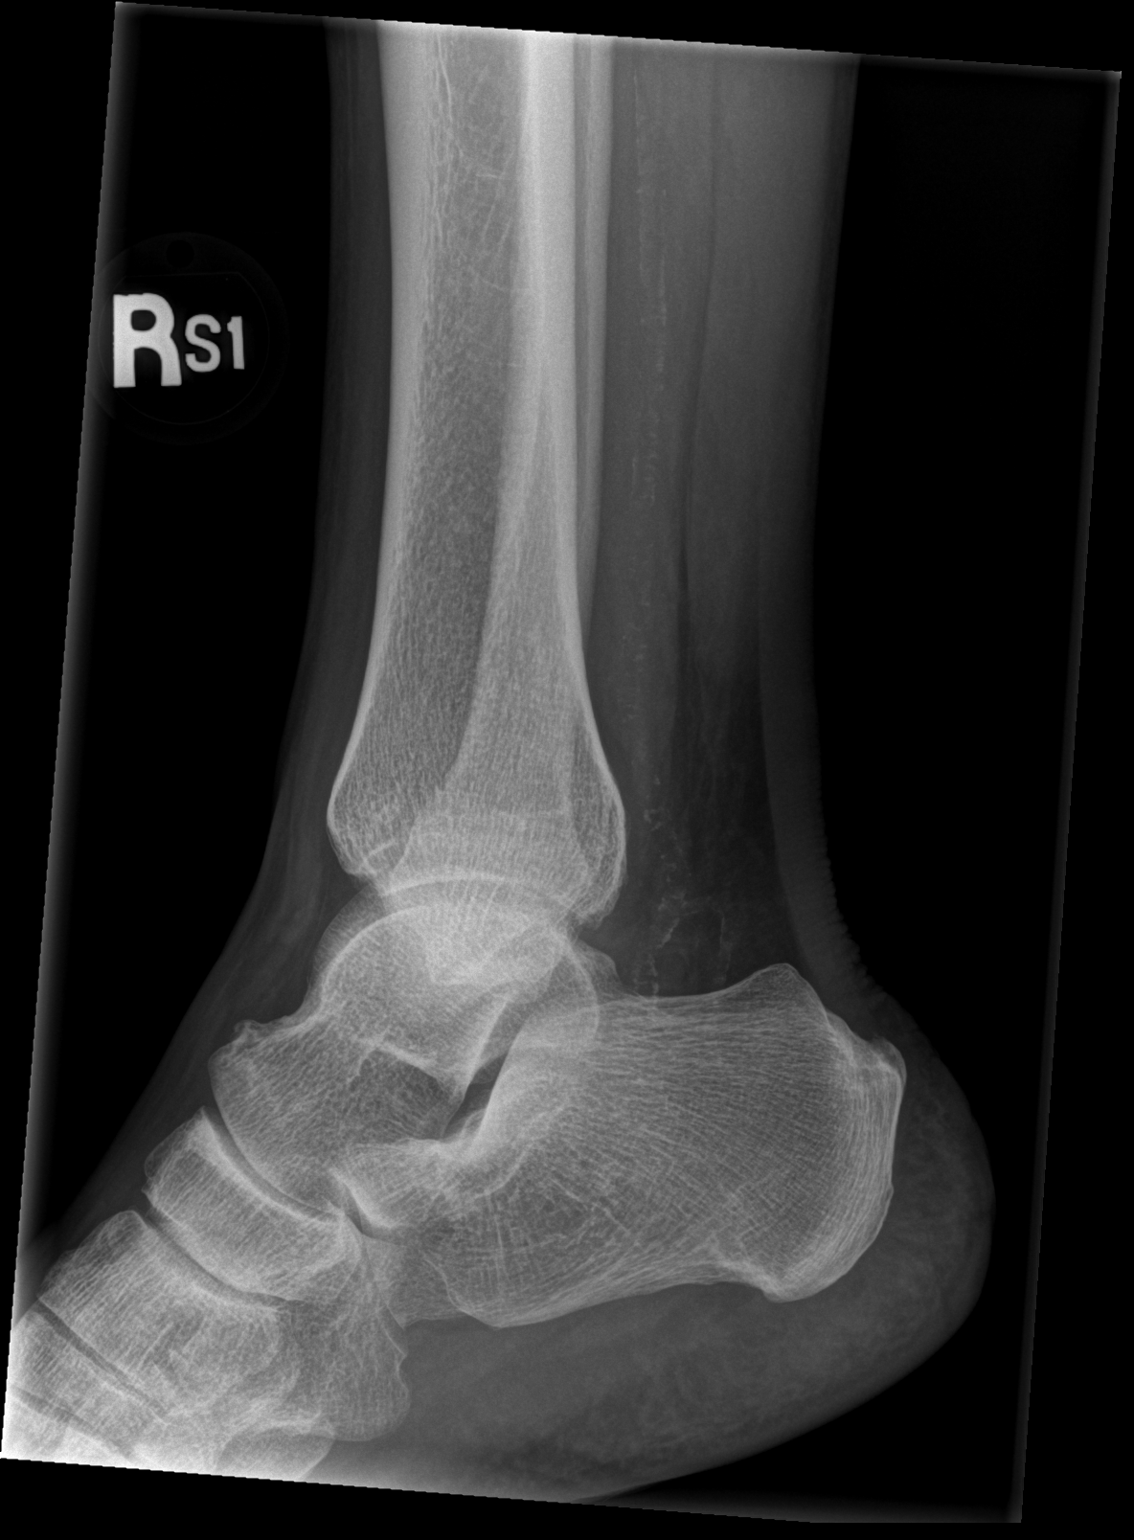

[3 of 3 positions shown; findings below may reference images not displayed]

FINDINGS: Three views of the right ankle submitted.  No acute
fracture or subluxation.  Ankle mortise is preserved.
IMPRESSION: No acute fracture or subluxation.

## 2013-05-19 IMAGING — CR DG FOOT COMPLETE 3+V*R*
3 series · 3 of 3 positions shown · non-contrast
Comparison: None.

CLINICAL DATA: Right great toe pain

RIGHT FOOT COMPLETE - 3+ VIEW

[x foot ap right]
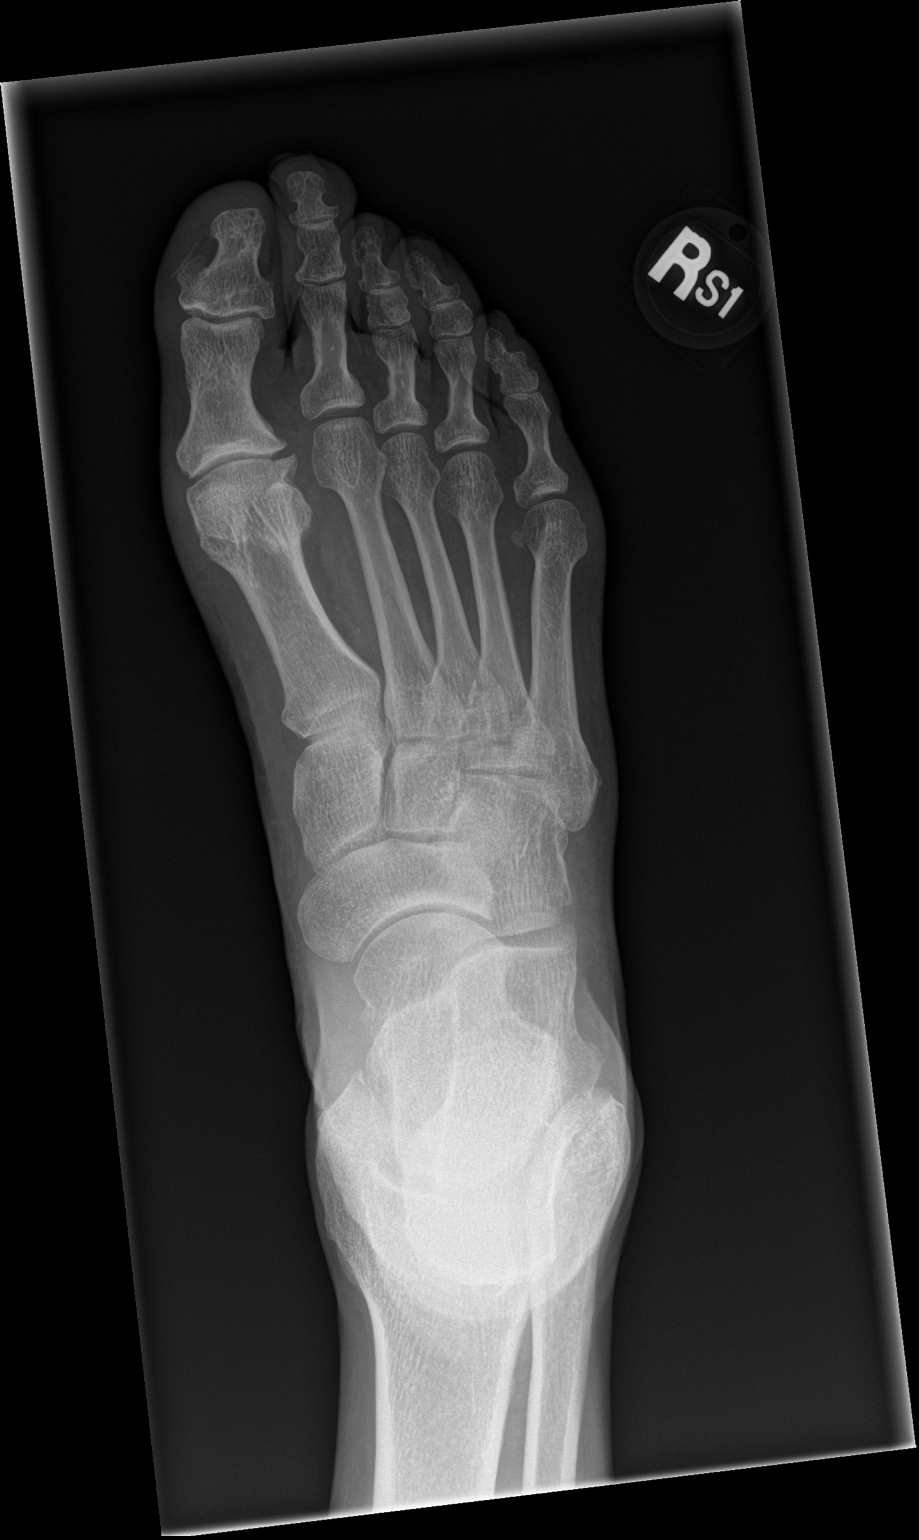

[x foot obl right]
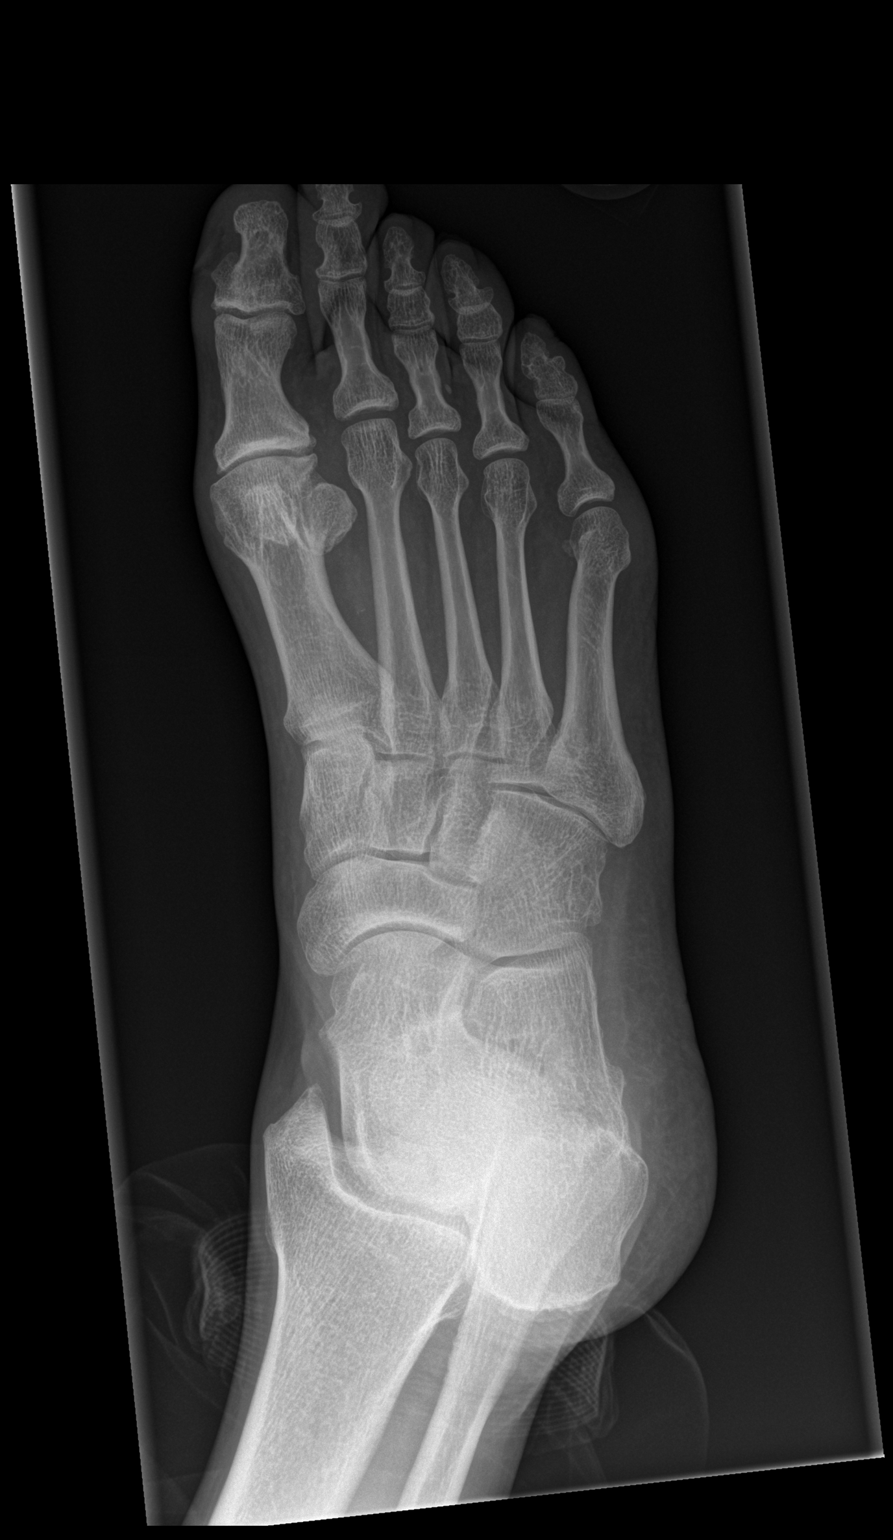

[x foot lat right]
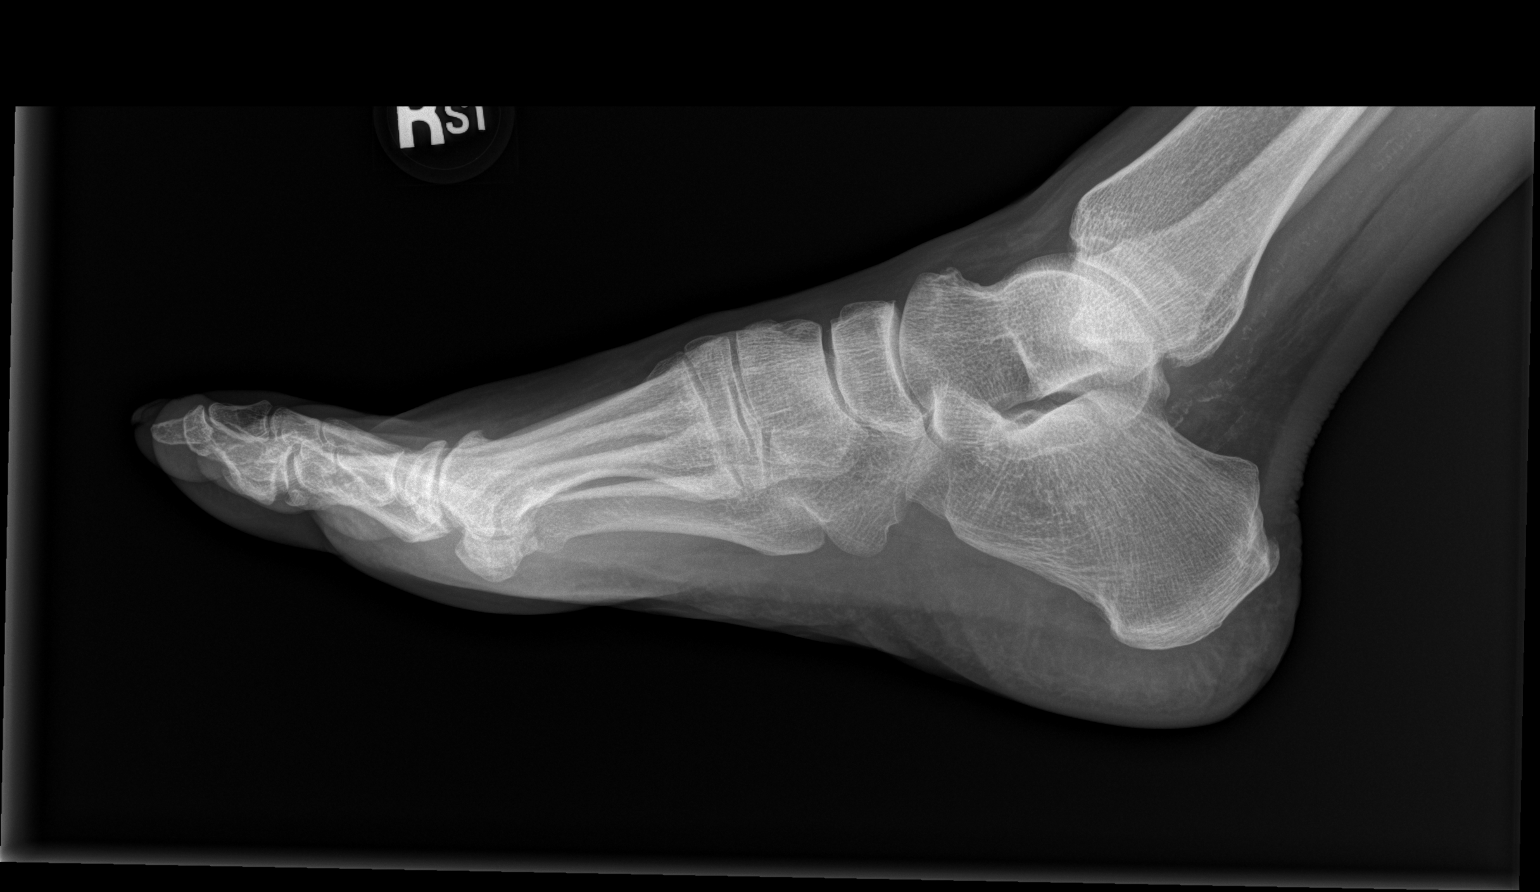

[3 of 3 positions shown; findings below may reference images not displayed]

FINDINGS: Three views of the right foot submitted.  No acute
fracture or subluxation.  Mild degenerative changes noted first
metatarsal phalangeal joint.
IMPRESSION: No acute fracture or subluxation.  Mild degenerative changes first
metatarsal phalangeal joint.

## 2013-05-25 ENCOUNTER — Encounter (HOSPITAL_COMMUNITY): Payer: Self-pay

## 2013-05-25 ENCOUNTER — Emergency Department (HOSPITAL_COMMUNITY)
Admission: EM | Admit: 2013-05-25 | Discharge: 2013-05-26 | Disposition: A | Discharge status: Home / Self Care | Payer: Medicare Other | Attending: Emergency Medicine | Admitting: Emergency Medicine

## 2013-05-25 DIAGNOSIS — G40909 Epilepsy, unspecified, not intractable, without status epilepticus: Secondary | ICD-10-CM | POA: Insufficient documentation

## 2013-05-25 DIAGNOSIS — Z8619 Personal history of other infectious and parasitic diseases: Secondary | ICD-10-CM | POA: Insufficient documentation

## 2013-05-25 DIAGNOSIS — F17200 Nicotine dependence, unspecified, uncomplicated: Secondary | ICD-10-CM | POA: Insufficient documentation

## 2013-05-25 DIAGNOSIS — I10 Essential (primary) hypertension: Secondary | ICD-10-CM | POA: Insufficient documentation

## 2013-05-25 DIAGNOSIS — F209 Schizophrenia, unspecified: Secondary | ICD-10-CM | POA: Insufficient documentation

## 2013-05-25 DIAGNOSIS — F329 Major depressive disorder, single episode, unspecified: Secondary | ICD-10-CM

## 2013-05-25 DIAGNOSIS — Z7982 Long term (current) use of aspirin: Secondary | ICD-10-CM | POA: Insufficient documentation

## 2013-05-25 DIAGNOSIS — Z79899 Other long term (current) drug therapy: Secondary | ICD-10-CM | POA: Insufficient documentation

## 2013-05-25 DIAGNOSIS — R45851 Suicidal ideations: Secondary | ICD-10-CM | POA: Insufficient documentation

## 2013-05-25 DIAGNOSIS — Z791 Long term (current) use of non-steroidal anti-inflammatories (NSAID): Secondary | ICD-10-CM | POA: Insufficient documentation

## 2013-05-25 HISTORY — DX: Depression, unspecified: F32.A

## 2013-05-25 HISTORY — DX: Major depressive disorder, single episode, unspecified: F32.9

## 2013-05-25 LAB — ETHANOL: Alcohol, Ethyl (B): <11 mg/dL (ref 0–11)

## 2013-05-25 LAB — RAPID URINE DRUG SCREEN, HOSP PERFORMED
Benzodiazepines: NOT DETECTED
Cocaine: NOT DETECTED

## 2013-05-25 LAB — URINALYSIS, ROUTINE W REFLEX MICROSCOPIC
Hgb urine dipstick: NEGATIVE
Leukocytes, UA: NEGATIVE
Nitrite: NEGATIVE
Specific Gravity, Urine: 1.017 (ref 1.005–1.030)
Urobilinogen, UA: 1 mg/dL (ref 0.0–1.0)
pH: 7 (ref 5.0–8.0)

## 2013-05-25 LAB — COMPREHENSIVE METABOLIC PANEL
Albumin: 3.7 g/dL (ref 3.5–5.2)
Alkaline Phosphatase: 76 U/L (ref 39–117)
BUN: 13 mg/dL (ref 6–23)
Potassium: 3.8 mEq/L (ref 3.5–5.1)
Total Protein: 7.3 g/dL (ref 6.0–8.3)

## 2013-05-25 LAB — CBC
HCT: 40.4 % (ref 39.0–52.0)
MCHC: 35.4 g/dL (ref 30.0–36.0)
RDW: 12.8 % (ref 11.5–15.5)

## 2013-05-25 LAB — ACETAMINOPHEN LEVEL: Acetaminophen (Tylenol), Serum: <15 ug/mL (ref 10–30)

## 2013-05-25 LAB — SALICYLATE LEVEL: Salicylate Lvl: <2 mg/dL — ABNORMAL LOW (ref 2.8–20.0)

## 2013-05-25 LAB — LITHIUM LEVEL: Lithium Lvl: 0.44 mEq/L — ABNORMAL LOW (ref 0.80–1.40)

## 2013-05-25 MED ORDER — NICOTINE 21 MG/24HR TD PT24
21.0000 mg | MEDICATED_PATCH | Freq: Every day | TRANSDERMAL | Status: DC
  Administered 2013-05-26: 21 mg via TRANSDERMAL
  Filled 2013-05-25: qty 1

## 2013-05-25 MED ORDER — ALUM & MAG HYDROXIDE-SIMETH 200-200-20 MG/5ML PO SUSP: 30.0000 mL | ORAL | Status: DC | PRN

## 2013-05-25 MED ORDER — ZOLPIDEM TARTRATE 5 MG PO TABS: 5.0000 mg | ORAL_TABLET | Freq: Every evening | ORAL | Status: DC | PRN

## 2013-05-25 MED ORDER — LORAZEPAM 1 MG PO TABS
1.0000 mg | ORAL_TABLET | Freq: Three times a day (TID) | ORAL | Status: DC | PRN
  Administered 2013-05-26: 1 mg via ORAL
  Filled 2013-05-25: qty 1

## 2013-05-25 MED ORDER — IBUPROFEN 200 MG PO TABS: 600.0000 mg | ORAL_TABLET | Freq: Three times a day (TID) | ORAL | Status: DC | PRN

## 2013-05-25 MED ORDER — ACETAMINOPHEN 325 MG PO TABS: 650.0000 mg | ORAL_TABLET | ORAL | Status: DC | PRN

## 2013-05-25 MED ORDER — ONDANSETRON HCL 4 MG PO TABS: 4.0000 mg | ORAL_TABLET | Freq: Three times a day (TID) | ORAL | Status: DC | PRN

## 2013-05-25 NOTE — ED Notes (Signed)
Pt brought in by [employer], pt from [facility], states he is hearing voices for a couple hours, states "they are telling me to kill myself", pt states hx of SI and feels manic.

## 2013-05-25 NOTE — ED Notes (Addendum)
Pt Belongings: At nurses station in triage  - 1 green, yellow, red baseball hat  - 1 pair brown tennis shoes  - 1 pair blue slacks - 1 black belt  - 1 pair boxers

## 2013-05-25 NOTE — ED Notes (Addendum)
Security called: pt and belongings wanded.

## 2013-05-25 NOTE — ED Provider Notes (Signed)
CSN: [id number]     Arrival date & time 05/25/13  1817 History   First MD Initiated Contact with Patient 05/25/13 1943     Chief Complaint  Patient presents with  . Medical Clearance   (Consider location/radiation/quality/duration/timing/severity/associated sxs/prior Treatment) HPI Patient presents to the emergency department with auditory hallucinations.  Patient, states, that he is not feeling suicidal, but does have a history of suicidal ideation.  Patient, states his medications are not helping with his symptoms.  The patient denies chest pain, shortness of breath, blurred vision, weakness, numbness, dizziness, abdominal pain, nausea, or vomiting.  Patient, states, that nothing seems to make his condition, better or worse. Past Medical History  Diagnosis Date  . Hypertension   . Psychiatric disorder   . Hepatitis C   . Seizures   . Schizophrenia   . Bipolar affective    History reviewed. No pertinent past surgical history. No family history on file. History  Substance Use Topics  . Smoking status: Current Every Day Smoker -- 0.50 packs/day for 3 years    Types: Cigarettes  . Smokeless tobacco: Never Used  . Alcohol Use: No    Review of Systems All other systems negative except as documented in the HPI. All pertinent positives and negatives as reviewed in the HPI. Allergies  Review of patient's allergies indicates no known allergies.  Home Medications   Current Outpatient Rx  Name  Route  Sig  Dispense  Refill  . acetaminophen (TYLENOL) 325 MG tablet   Oral   Take 650 mg by mouth every 6 (six) hours as needed. For pain         . aspirin EC 81 MG tablet   Oral   Take 81 mg by mouth daily.         . cholecalciferol (VITAMIN D) 1000 UNITS tablet   Oral   Take 1,000 Units by mouth daily.         . clonazePAM (KLONOPIN) 1 MG tablet   Oral   Take 1 mg by mouth at bedtime.         . folic acid (FOLVITE) 1 MG tablet   Oral   Take 1 mg by mouth daily.          . haloperidol (HALDOL) 5 MG tablet   Oral   Take 5 mg by mouth at bedtime.         . haloperidol decanoate (HALDOL DECANOATE) 50 MG/ML injection   Intramuscular   Inject 200 mg into the muscle every 28 (twenty-eight) days.         Marland Kitchen levETIRAcetam (KEPPRA) 500 MG tablet   Oral   Take 500 mg by mouth 2 (two) times daily.         Marland Kitchen lithium 300 MG tablet   Oral   Take 300 mg by mouth 2 (two) times daily.         . meloxicam (MOBIC) 15 MG tablet   Oral   Take 15 mg by mouth daily.         . Multiple Vitamin (MULTIVITAMIN WITH MINERALS) TABS   Oral   Take 1 tablet by mouth daily.         . polyethylene glycol (MIRALAX / GLYCOLAX) packet   Oral   Take 17 g by mouth daily as needed. For constipation         . sertraline (ZOLOFT) 100 MG tablet   Oral   Take 100 mg by mouth daily.         Marland Kitchen  thiamine 100 MG tablet   Oral   Take 100 mg by mouth daily.         . vitamin B-12 (CYANOCOBALAMIN) 1000 MCG tablet   Oral   Take 1,000 mcg by mouth daily.          BP 135/86  Pulse 67  Temp(Src) 98.6 F (37 C) (Oral)  Resp 20  SpO2 95% Physical Exam  Nursing note and vitals reviewed. Constitutional: He is oriented to person, place, and time. He appears well-developed and well-nourished. No distress.  HENT:  Head: Normocephalic and atraumatic.  Eyes: Pupils are equal, round, and reactive to light.  Neck: Normal range of motion. Neck supple.  Cardiovascular: Normal rate, regular rhythm and normal heart sounds.  Exam reveals no gallop and no friction rub.   No murmur heard. Pulmonary/Chest: Effort normal and breath sounds normal.  Neurological: He is alert and oriented to person, place, and time.  Skin: Skin is warm and dry. No rash noted. No erythema.  Psychiatric: His mood appears not anxious. His affect is not angry. His speech is not rapid and/or pressured. He is actively hallucinating. He is not agitated. Thought content is not paranoid and not  delusional. He exhibits a depressed mood. He expresses no homicidal and no suicidal ideation. He expresses no suicidal plans and no homicidal plans.    ED Course  Procedures (including critical care time) Labs Review Labs Reviewed  CBC  URINE RAPID DRUG SCREEN (HOSP PERFORMED)  ACETAMINOPHEN LEVEL  COMPREHENSIVE METABOLIC PANEL  ETHANOL  SALICYLATE LEVEL   Patient will need psychiatric evaluation  MDM    Carlyle Dolly, PA-C 05/26/13 0050  Carlyle Dolly, PA-C 05/26/13 [phone]

## 2013-05-26 ENCOUNTER — Encounter (HOSPITAL_COMMUNITY): Payer: Self-pay | Admitting: *Deleted

## 2013-05-26 DIAGNOSIS — F329 Major depressive disorder, single episode, unspecified: Secondary | ICD-10-CM

## 2013-05-26 NOTE — ED Provider Notes (Signed)
Medical screening examination/treatment/procedure(s) were performed by non-physician practitioner and as supervising physician I was immediately available for consultation/collaboration.  [name], MD 05/26/13 [phone]

## 2013-05-26 NOTE — ED Notes (Signed)
Called to [facility] home where pt resides and there was no answer. Pt is going to be released after lunch and I was attempting to notify facility so that they could arrange for transportation.
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
## 2013-05-26 NOTE — Consult Note (Signed)
  Evaluation for inpatient treatment; Face to face interview and consulted with Dr.  Referred by:  EDP   HPI:  Patient presents to [facility] with complaints suicidal thoughts off and on.  "I have thoughts of hurting my self off and on but I would never act on them.  I fight it ."  Patient states that he has a scheduled appointment with primary 05/29/2013.  Patient states that he is able to contract for safety.  Patient denies suicidal ideation at this them.  Patient also denies homicidal ideation, psychosis, and paranoia.      @ Axis I: Major Depressive Disorder Axis II: Deferred Axis III:  Past Medical History  Diagnosis Date  . Hypertension   . Psychiatric disorder   . Hepatitis C   . Seizures   . Schizophrenia   . Bipolar affective   . Depression    Axis IV: other psychosocial or environmental problems and problems with primary support group Axis V: 51-60 moderate symptoms  @Psychiatric  Specialty Exam: @PHYSEXAMBYAGE2 @  @ROS @  Blood pressure 124/87, pulse 59, temperature 98.8 F (37.1 C), temperature source Oral, resp. rate 16, SpO2 96.00%.There is no weight on file to calculate BMI.  General Appearance: Casual and Disheveled  Eye Contact::  Good  Speech:  Clear and Coherent, Normal Rate and Slurred  Volume:  Normal  Mood:  Depressed  Affect:  Depressed  Thought Process:  Circumstantial and Goal Directed  Orientation:  Full (Time, Place, and Person)  Thought Content:  Rumination  Suicidal Thoughts:  No  Homicidal Thoughts:  No  Memory:  Immediate;   Fair Recent;   Fair Remote;   Fair  Judgement:  Fair  Insight:  Fair  Psychomotor Activity:  Normal  Concentration:  Fair  Recall:  Fair  Akathisia:  No  Handed:  Right  AIMS (if indicated):     Assets:  Communication Skills Desire for Improvement Housing Social Support  Sleep:       Plan: Disposition:  Discharge to assisted living facility.  Follow up with primary outpatient provider Vesta Mixer) with scheduled  appointment on 05/29/2013

## 2013-05-28 NOTE — Consult Note (Signed)
Agree with plan 

## 2013-05-31 ENCOUNTER — Encounter (HOSPITAL_COMMUNITY): Payer: Self-pay | Admitting: Emergency Medicine

## 2013-05-31 ENCOUNTER — Emergency Department (HOSPITAL_COMMUNITY): Payer: Medicare Other

## 2013-05-31 ENCOUNTER — Emergency Department (HOSPITAL_COMMUNITY)
Admission: EM | Admit: 2013-05-31 | Discharge: 2013-06-01 | Disposition: A | Discharge status: Home / Self Care | Payer: Medicare Other | Attending: Emergency Medicine | Admitting: Emergency Medicine

## 2013-05-31 DIAGNOSIS — Z79899 Other long term (current) drug therapy: Secondary | ICD-10-CM | POA: Insufficient documentation

## 2013-05-31 DIAGNOSIS — Z8619 Personal history of other infectious and parasitic diseases: Secondary | ICD-10-CM | POA: Insufficient documentation

## 2013-05-31 DIAGNOSIS — S0990XA Unspecified injury of head, initial encounter: Secondary | ICD-10-CM | POA: Insufficient documentation

## 2013-05-31 DIAGNOSIS — F17200 Nicotine dependence, unspecified, uncomplicated: Secondary | ICD-10-CM | POA: Insufficient documentation

## 2013-05-31 DIAGNOSIS — Z7982 Long term (current) use of aspirin: Secondary | ICD-10-CM | POA: Insufficient documentation

## 2013-05-31 DIAGNOSIS — Y9389 Activity, other specified: Secondary | ICD-10-CM | POA: Insufficient documentation

## 2013-05-31 DIAGNOSIS — W1809XA Striking against other object with subsequent fall, initial encounter: Secondary | ICD-10-CM | POA: Insufficient documentation

## 2013-05-31 DIAGNOSIS — R55 Syncope and collapse: Secondary | ICD-10-CM | POA: Insufficient documentation

## 2013-05-31 DIAGNOSIS — I10 Essential (primary) hypertension: Secondary | ICD-10-CM | POA: Insufficient documentation

## 2013-05-31 DIAGNOSIS — Z8659 Personal history of other mental and behavioral disorders: Secondary | ICD-10-CM | POA: Insufficient documentation

## 2013-05-31 DIAGNOSIS — F319 Bipolar disorder, unspecified: Secondary | ICD-10-CM | POA: Insufficient documentation

## 2013-05-31 DIAGNOSIS — Y92009 Unspecified place in unspecified non-institutional (private) residence as the place of occurrence of the external cause: Secondary | ICD-10-CM | POA: Insufficient documentation

## 2013-05-31 DIAGNOSIS — F489 Nonpsychotic mental disorder, unspecified: Secondary | ICD-10-CM | POA: Insufficient documentation

## 2013-05-31 LAB — URINALYSIS, ROUTINE W REFLEX MICROSCOPIC
Bilirubin Urine: NEGATIVE
Glucose, UA: NEGATIVE mg/dL
Hgb urine dipstick: NEGATIVE
Ketones, ur: NEGATIVE mg/dL
Leukocytes, UA: NEGATIVE
Nitrite: NEGATIVE
Protein, ur: NEGATIVE mg/dL
Urobilinogen, UA: 1 mg/dL (ref 0.0–1.0)
pH: 7 (ref 5.0–8.0)

## 2013-05-31 LAB — CBC WITH DIFFERENTIAL/PLATELET
Basophils Absolute: 0 10*3/uL (ref 0.0–0.1)
Basophils Relative: 1 % (ref 0–1)
Eosinophils Relative: 4 % (ref 0–5)
HCT: 38.1 % — ABNORMAL LOW (ref 39.0–52.0)
Lymphocytes Relative: 38 % (ref 12–46)
Lymphs Abs: 2.2 10*3/uL (ref 0.7–4.0)
MCV: 89 fL (ref 78.0–100.0)
Monocytes Absolute: 0.5 10*3/uL (ref 0.1–1.0)
Neutro Abs: 2.9 10*3/uL (ref 1.7–7.7)
Neutrophils Relative %: 49 % (ref 43–77)
Platelets: 181 10*3/uL (ref 150–400)
RBC: 4.28 MIL/uL (ref 4.22–5.81)
RDW: 12.7 % (ref 11.5–15.5)
WBC: 5.9 10*3/uL (ref 4.0–10.5)

## 2013-05-31 LAB — RAPID URINE DRUG SCREEN, HOSP PERFORMED
Amphetamines: NOT DETECTED
Benzodiazepines: NOT DETECTED
Cocaine: NOT DETECTED
Opiates: NOT DETECTED
Tetrahydrocannabinol: NOT DETECTED

## 2013-05-31 LAB — ETHANOL: Alcohol, Ethyl (B): <11 mg/dL (ref 0–11)

## 2013-05-31 LAB — BASIC METABOLIC PANEL
CO2: 25 mEq/L (ref 19–32)
Calcium: 9.3 mg/dL (ref 8.4–10.5)
Chloride: 105 mEq/L (ref 96–112)
Creatinine, Ser: 0.94 mg/dL (ref 0.50–1.35)
GFR calc Af Amer: >90 mL/min (ref >90)
GFR calc non Af Amer: 88 mL/min — ABNORMAL LOW (ref >90)
Potassium: 3.7 mEq/L (ref 3.5–5.1)
Sodium: 140 mEq/L (ref 135–145)

## 2013-05-31 MED ORDER — SODIUM CHLORIDE 0.9 % IV BOLUS (SEPSIS)
1000.0000 mL | Freq: Once | INTRAVENOUS | Status: AC
  Administered 2013-05-31: 1000 mL via INTRAVENOUS

## 2013-05-31 NOTE — ED Notes (Signed)
Per EMS: pt from group home, staff states pt was standing up and all of a sudden fell on his back into a full body shaking seizure. Pt was A&O x 4 when EMS arrived. 18 g in left AC. CBG 100 after oral glucose.

## 2013-05-31 NOTE — ED Provider Notes (Signed)
CSN: [id number]     Arrival date & time 05/31/13  1839 History   First MD Initiated Contact with Patient 05/31/13 1909     Chief Complaint  Patient presents with  . Seizures   (Consider location/radiation/quality/duration/timing/severity/associated sxs/prior Treatment) HPI  62 year old male with history of psychiatric disorder, hepatitis C, seizure was taking Keppra presents for evaluation of suspect seizure vs syncope. Patient was brought here via EMS. Patient is at a group home.  Pt has a suspect witnessed seizure episode when he was stand up but suddenly fall backward, hitting his head, and seized (body tremor) for 1 min.  . Pt however sts he was sitting, got up too fast, got dizzy and passed out. Denies tongue biting or urinary/bowel incontinence.  Report having a similar episode a year ago. Sts he has not being treated for it and currently not on any medication for it.  Currently denies having headache, neck pain, cp, sob, abd pain.  Pt denies SI/HI, hallucination.  Denies recent EtOH or rec drug use.  Request to be discharge.      Past Medical History  Diagnosis Date  . Hypertension   . Psychiatric disorder   . Hepatitis C   . Seizures   . Schizophrenia   . Bipolar affective   . Depression    History reviewed. No pertinent past surgical history. No family history on file. History  Substance Use Topics  . Smoking status: Current Every Day Smoker -- 0.50 packs/day for 3 years    Types: Cigarettes  . Smokeless tobacco: Never Used  . Alcohol Use: No    Review of Systems  All other systems reviewed and are negative.    Allergies  Review of patient's allergies indicates no known allergies.  Home Medications   Current Outpatient Rx  Name  Route  Sig  Dispense  Refill  . aspirin EC 81 MG tablet   Oral   Take 81 mg by mouth daily.         . benztropine (COGENTIN) 0.5 MG tablet   Oral   Take 0.5 mg by mouth 2 (two) times daily.         . cholecalciferol (VITAMIN D)  1000 UNITS tablet   Oral   Take 1,000 Units by mouth daily.         . colchicine 0.6 MG tablet   Oral   Take 0.6 mg by mouth daily.         . haloperidol (HALDOL) 10 MG tablet   Oral   Take 10 mg by mouth 2 (two) times daily.         . haloperidol (HALDOL) 2 MG tablet   Oral   Take 2 mg by mouth at bedtime.         . haloperidol decanoate (HALDOL DECANOATE) 100 MG/ML injection   Intramuscular   Inject 100 mg into the muscle every 28 (twenty-eight) days.         Marland Kitchen ibuprofen (ADVIL,MOTRIN) 600 MG tablet   Oral   Take 600 mg by mouth every 6 (six) hours as needed for pain.         Marland Kitchen lithium carbonate (LITHOBID) 300 MG CR tablet   Oral   Take 300 mg by mouth 2 (two) times daily.         . Multiple Vitamin (MULTIVITAMIN WITH MINERALS) TABS tablet   Oral   Take 1 tablet by mouth daily.         Marland Kitchen thiamine (VITAMIN  B-1) 100 MG tablet   Oral   Take 100 mg by mouth daily.         . vitamin B-12 (CYANOCOBALAMIN) 1000 MCG tablet   Oral   Take 1,000 mcg by mouth daily.          BP 116/66  Pulse 64  Temp(Src) 98.4 F (36.9 C) (Oral)  Resp 18  SpO2 98% Physical Exam  Nursing note and vitals reviewed. Constitutional: He is oriented to person, place, and time. He appears well-developed and well-nourished. No distress.  Awake, alert, nontoxic appearance  HENT:  Head: Atraumatic.  No scalp tenderness, no hemotympanum, no septal hematoma, no midface tenderness, no malocclusion.  No tongue biting  Eyes: Conjunctivae are normal. Right eye exhibits no discharge. Left eye exhibits no discharge.  Neck: Normal range of motion. Neck supple.  Cardiovascular: Normal rate and regular rhythm.   Pulmonary/Chest: Effort normal. No respiratory distress. He exhibits no tenderness.  Abdominal: Soft. There is no tenderness. There is no rebound.  Musculoskeletal: He exhibits no tenderness.  ROM appears intact, no obvious focal weakness  Neurological: He is alert and  oriented to person, place, and time. He has normal strength. No cranial nerve deficit or sensory deficit. He exhibits normal muscle tone. He displays a negative Romberg sign. Coordination and gait normal. GCS eye subscore is 4. GCS verbal subscore is 5. GCS motor subscore is 6.  Skin: Skin is warm and dry. No rash noted.  Psychiatric: He has a normal mood and affect.    ED Course  Procedures (including critical care time)  Date: 05/31/2013  Rate: 56  Rhythm: normal sinus rhythm  QRS Axis: normal  Intervals: PR prolonged  ST/T Wave abnormalities: nonspecific T wave changes  Conduction Disutrbances:first-degree A-V block   Narrative Interpretation:   Old EKG Reviewed: unchanged    8:36 PM Pt with syncope vs. Seizure.  Pt is A&Ox4.  Doubt seizure.  Work up initiated.    10:03 PM EKG unchanged, normal orthostatic vital sign.  Pt request to be discharge.  sts he is back to his baseline.  Does not think he had a seizure episode.  He did take Keppra remotely but sts he no longer takes them.  I did offer to refill his Keppra and recommend f/u with neurology for further care.  Care discussed with attending.    11:19 PM Work up unremarkable. Pt is back to baseline.  Pt stable for discharge.  Return precaution discussed. Pt does nto want to take Keppra.    Labs Review Labs Reviewed  BASIC METABOLIC PANEL - Abnormal; Notable for the following:    GFR calc non Af Amer 88 (*)    All other components within normal limits  CBC WITH DIFFERENTIAL - Abnormal; Notable for the following:    HCT 38.1 (*)    All other components within normal limits  URINE RAPID DRUG SCREEN (HOSP PERFORMED)  ETHANOL  URINALYSIS, ROUTINE W REFLEX MICROSCOPIC  GLUCOSE, CAPILLARY   Imaging Review Ct Head Wo Contrast  05/31/2013   CLINICAL DATA:  Fall. Seizure.  EXAM: CT HEAD WITHOUT CONTRAST  TECHNIQUE: Contiguous axial images were obtained from the base of the skull through the vertex without intravenous contrast.   COMPARISON:  05/12/2012  FINDINGS: The ventricles are normal in size, for this patient's age, and normal in configuration.  There are no parenchymal masses or mass effect. There are no areas of abnormal parenchymal attenuation. There is no evidence of a recent infarct.  There  are no extra-axial masses or abnormal fluid collections.  There is no intracranial hemorrhage.  No skull fracture. The visualized sinuses and mastoid air cells are clear.  IMPRESSION: Normal unenhanced CT scan of the brain for age.   Electronically Signed   By: Amie Portland   On: 05/31/2013 21:14    MDM   1. Syncope    BP 123/88  Pulse 57  Temp(Src) 98.4 F (36.9 C) (Oral)  Resp 18  SpO2 98%  I have reviewed nursing notes and vital signs. I personally reviewed the imaging tests through PACS system  I reviewed available ER/hospitalization records thought the EMR     Fayrene Helper, PA-C 05/31/13 2320  Fayrene Helper, PA-C 05/31/13 2320

## 2013-05-31 NOTE — ED Provider Notes (Signed)
Medical screening examination/treatment/procedure(s) were performed by non-physician practitioner and as supervising physician I was immediately available for consultation/collaboration.    Nelia Shi, MD 05/31/13 2322

## 2013-06-01 NOTE — ED Notes (Signed)
Report given to [facility] at [facility], pt being transported by [facility]

## 2013-06-01 NOTE — ED Notes (Signed)
[facility] notified for transport back to [facility]

## 2013-06-13 ENCOUNTER — Encounter (HOSPITAL_COMMUNITY): Payer: Self-pay | Admitting: Emergency Medicine

## 2013-06-13 ENCOUNTER — Emergency Department (HOSPITAL_COMMUNITY)
Admission: EM | Admit: 2013-06-13 | Discharge: 2013-06-13 | Disposition: A | Discharge status: Home / Self Care | Payer: Medicare Other | Attending: Emergency Medicine | Admitting: Emergency Medicine

## 2013-06-13 DIAGNOSIS — I10 Essential (primary) hypertension: Secondary | ICD-10-CM | POA: Diagnosis not present

## 2013-06-13 DIAGNOSIS — R1012 Left upper quadrant pain: Secondary | ICD-10-CM | POA: Diagnosis not present

## 2013-06-13 DIAGNOSIS — Z8619 Personal history of other infectious and parasitic diseases: Secondary | ICD-10-CM | POA: Diagnosis not present

## 2013-06-13 DIAGNOSIS — Z79899 Other long term (current) drug therapy: Secondary | ICD-10-CM | POA: Diagnosis not present

## 2013-06-13 DIAGNOSIS — R1013 Epigastric pain: Secondary | ICD-10-CM

## 2013-06-13 DIAGNOSIS — Z8669 Personal history of other diseases of the nervous system and sense organs: Secondary | ICD-10-CM | POA: Diagnosis not present

## 2013-06-13 DIAGNOSIS — R42 Dizziness and giddiness: Secondary | ICD-10-CM | POA: Diagnosis not present

## 2013-06-13 DIAGNOSIS — Z7982 Long term (current) use of aspirin: Secondary | ICD-10-CM | POA: Diagnosis not present

## 2013-06-13 DIAGNOSIS — F319 Bipolar disorder, unspecified: Secondary | ICD-10-CM | POA: Diagnosis not present

## 2013-06-13 DIAGNOSIS — F209 Schizophrenia, unspecified: Secondary | ICD-10-CM | POA: Diagnosis not present

## 2013-06-13 DIAGNOSIS — R51 Headache: Secondary | ICD-10-CM | POA: Diagnosis not present

## 2013-06-13 DIAGNOSIS — F17200 Nicotine dependence, unspecified, uncomplicated: Secondary | ICD-10-CM | POA: Insufficient documentation

## 2013-06-13 LAB — CBC WITH DIFFERENTIAL/PLATELET
Eosinophils Absolute: 0.1 10*3/uL (ref 0.0–0.7)
HCT: 39 % (ref 39.0–52.0)
Hemoglobin: 13.8 g/dL (ref 13.0–17.0)
Lymphs Abs: 1.7 10*3/uL (ref 0.7–4.0)
MCH: 31.6 pg (ref 26.0–34.0)
MCHC: 35.4 g/dL (ref 30.0–36.0)
MCV: 89.2 fL (ref 78.0–100.0)
Monocytes Absolute: 0.4 10*3/uL (ref 0.1–1.0)
Monocytes Relative: 10 % (ref 3–12)
Neutro Abs: 1.9 10*3/uL (ref 1.7–7.7)
Neutrophils Relative %: 47 % (ref 43–77)
RBC: 4.37 MIL/uL (ref 4.22–5.81)
RDW: 12.2 % (ref 11.5–15.5)

## 2013-06-13 LAB — COMPREHENSIVE METABOLIC PANEL
ALT: 21 U/L (ref 0–53)
Alkaline Phosphatase: 80 U/L (ref 39–117)
BUN: 8 mg/dL (ref 6–23)
Chloride: 105 mEq/L (ref 96–112)
Creatinine, Ser: 0.9 mg/dL (ref 0.50–1.35)
GFR calc Af Amer: >90 mL/min (ref >90)
GFR calc non Af Amer: 89 mL/min — ABNORMAL LOW (ref >90)
Glucose, Bld: 92 mg/dL (ref 70–99)
Potassium: 3.6 mEq/L (ref 3.5–5.1)
Total Bilirubin: 0.8 mg/dL (ref 0.3–1.2)
Total Protein: 6.8 g/dL (ref 6.0–8.3)

## 2013-06-13 LAB — LIPASE, BLOOD: Lipase: 27 U/L (ref 11–59)

## 2013-06-13 LAB — LITHIUM LEVEL: Lithium Lvl: <0.25 mEq/L — ABNORMAL LOW (ref 0.80–1.40)

## 2013-06-13 LAB — POCT I-STAT TROPONIN I

## 2013-06-13 NOTE — ED Notes (Signed)
Pt from home reports that he is having epigastric pain, "not enough air to my brain", hearing voices telling him to kill himself, but no one else. Pt in NAD

## 2013-06-13 NOTE — ED Provider Notes (Signed)
CSN: [id number]     Arrival date & time 06/13/13  1034 History   First MD Initiated Contact with Patient 06/13/13 1117     Chief Complaint  Patient presents with  . Abdominal Pain   (Consider location/radiation/quality/duration/timing/severity/associated sxs/prior Treatment) The history is provided by the patient and medical records.   This is a 62 y.o. Male with PMH significant for HTN, Hep C, seizures, schizophrenia, bipolar d/o, depression, presenting to the ED for epigastric abdominal pain.  Pt describes pain as sharp, non-radiating.  No associated nausea, vomiting, or diarrhea.  No fevers, sweats, or chills.  Normal PO intake.  Pt also complains headache.  States he had a "dizzy spell" this morning and fell.  He did not experience head trauma or LOC.  No associated photophobia, phonophobia, aura, tinnitus, visual disturbance, confusion, or changes in speech.  Pt experienced auditory hallucinations yesterday, but none today-- has a hx of the same.  No visual hallucinations.  Denies any homicidal or suicidal ideation.  Pt states he has not been taking his lithium this past week but did take it this morning.  Denies any recent seizure activity.  No EtOH or illicit drug use.  Denies any chest pain, SOB, or palpitations.  Past Medical History  Diagnosis Date  . Hypertension   . Psychiatric disorder   . Hepatitis C   . Seizures   . Schizophrenia   . Bipolar affective   . Depression    History reviewed. No pertinent past surgical history. No family history on file. History  Substance Use Topics  . Smoking status: Current Every Day Smoker -- 0.50 packs/day for 3 years    Types: Cigarettes  . Smokeless tobacco: Never Used  . Alcohol Use: No    Review of Systems  Gastrointestinal: Positive for abdominal pain.  Neurological: Positive for headaches.  All other systems reviewed and are negative.    Allergies  Review of patient's allergies indicates no known allergies.  Home  Medications   Current Outpatient Rx  Name  Route  Sig  Dispense  Refill  . aspirin EC 81 MG tablet   Oral   Take 81 mg by mouth daily.         . benztropine (COGENTIN) 0.5 MG tablet   Oral   Take 0.5 mg by mouth 2 (two) times daily.         . colchicine 0.6 MG tablet   Oral   Take 0.6 mg by mouth daily.         . haloperidol (HALDOL) 10 MG tablet   Oral   Take 10 mg by mouth 2 (two) times daily.         . haloperidol (HALDOL) 2 MG tablet   Oral   Take 2 mg by mouth at bedtime.         Marland Kitchen ibuprofen (ADVIL,MOTRIN) 600 MG tablet   Oral   Take 600 mg by mouth every 6 (six) hours as needed for pain.         Marland Kitchen lithium carbonate (LITHOBID) 300 MG CR tablet   Oral   Take 300 mg by mouth 2 (two) times daily.         Marland Kitchen thiamine (VITAMIN B-1) 100 MG tablet   Oral   Take 100 mg by mouth daily.          BP 133/78  Pulse 62  Temp(Src) 98.1 F (36.7 C) (Oral)  Resp 16  SpO2 97%  Physical Exam  Nursing note  and vitals reviewed. Constitutional: He is oriented to person, place, and time. He appears well-developed and well-nourished. No distress.  HENT:  Head: Normocephalic and atraumatic. Head is without raccoon's eyes, without Battle's sign, without abrasion, without contusion and without laceration.  Right Ear: Tympanic membrane and ear canal normal.  Left Ear: Tympanic membrane and ear canal normal.  Nose: Nose normal.  Mouth/Throat: Uvula is midline, oropharynx is clear and moist and mucous membranes are normal.  No visible signs of head trauma; no hemotympanum  Eyes: Conjunctivae and EOM are normal. Pupils are equal, round, and reactive to light.  Neck: Normal range of motion and full passive range of motion without pain. Neck supple. No rigidity.  No meningeal signs  Cardiovascular: Normal rate, regular rhythm and normal heart sounds.   Pulmonary/Chest: Effort normal and breath sounds normal. No respiratory distress. He has no wheezes.  Abdominal: There  is tenderness in the epigastric area and left upper quadrant. There is no CVA tenderness, no tenderness at McBurney's point and negative Murphy's sign.  Abdomen soft; no peritoneal signs  Musculoskeletal: Normal range of motion. He exhibits no edema.  Neurological: He is alert and oriented to person, place, and time. He has normal strength. He displays no tremor. No cranial nerve deficit or sensory deficit. He displays no seizure activity. Gait normal.  CN grossly intact, moves all extremities appropriately without ataxia, no focal neuro deficits or facial droop appreciated; ambulating unassisted without difficulty  Skin: Skin is warm and dry. He is not diaphoretic.  Psychiatric: He has a normal mood and affect. His speech is normal and behavior is normal. He is not actively hallucinating. Thought content is not delusional. He expresses no homicidal and no suicidal ideation. He expresses no suicidal plans and no homicidal plans.    ED Course  Procedures (including critical care time)   Date: 06/13/2013  Rate: 62  Rhythm: normal sinus rhythm  QRS Axis: normal  Intervals: normal  ST/T Wave abnormalities: normal  Conduction Disutrbances:none  Narrative Interpretation:   Old EKG Reviewed: unchanged   Labs Review Labs Reviewed  COMPREHENSIVE METABOLIC PANEL - Abnormal; Notable for the following:    Albumin 3.4 (*)    GFR calc non Af Amer 89 (*)    All other components within normal limits  LITHIUM LEVEL - Abnormal; Notable for the following:    Lithium Lvl <0.25 (*)    All other components within normal limits  CBC WITH DIFFERENTIAL  LIPASE, BLOOD  POCT I-STAT TROPONIN I   Imaging Review No results found.    MDM   1. Epigastric abdominal pain    Orthostatic VS appropriate.  EKG NSR, no acute ischemic changes.  Trop negative.  Labs largely WNL-- lithium level low as pt has been noncompliant with his medications.  Abdomen soft, without peritoneal signs-- i doubt acute/surgical  abdomen at this time.  Headache without focal neuro deficits-- i doubt ICH, SAH, TIA, stroke, or meningitis.  Pt afebrile, non-toxic appearing, NAD, VS stable.  No active hallucinations, denies SI/HI/AVH at this time.  Strongly encouraged pt to continue taking his lithium on a daily basis.  FU with psychiatrist and or PCP if problems occur.  Discussed plan with pt, he agreed.  Return precautions advised.    Garlon Hatchet, PA-C 06/13/13 1352

## 2013-06-13 NOTE — ED Notes (Addendum)
Pt from home reports that he fell in floor today, but was not injured. Pt adds that he has a HA and had epigastric pain. Pt adds that he is hearing voices, but "can't make them out. They're mumbling." Pt denies wanting to harm himself or anyone else. Pt is A&O and in NAD. Pt has psych hx

## 2013-06-13 NOTE — ED Provider Notes (Signed)
Medical screening examination/treatment/procedure(s) were performed by non-physician practitioner and as supervising physician I was immediately available for consultation/collaboration.  Shon Baton, MD 06/13/13 [phone]

## 2013-07-28 ENCOUNTER — Emergency Department (HOSPITAL_COMMUNITY): Payer: Medicare Other

## 2013-07-28 ENCOUNTER — Encounter (HOSPITAL_COMMUNITY): Payer: Self-pay | Admitting: Emergency Medicine

## 2013-07-28 ENCOUNTER — Emergency Department (HOSPITAL_COMMUNITY)
Admission: EM | Admit: 2013-07-28 | Discharge: 2013-07-28 | Disposition: A | Discharge status: Home / Self Care | Payer: Medicare Other | Attending: Emergency Medicine | Admitting: Emergency Medicine

## 2013-07-28 DIAGNOSIS — F319 Bipolar disorder, unspecified: Secondary | ICD-10-CM | POA: Insufficient documentation

## 2013-07-28 DIAGNOSIS — R109 Unspecified abdominal pain: Secondary | ICD-10-CM

## 2013-07-28 DIAGNOSIS — R112 Nausea with vomiting, unspecified: Secondary | ICD-10-CM | POA: Insufficient documentation

## 2013-07-28 DIAGNOSIS — R1033 Periumbilical pain: Secondary | ICD-10-CM | POA: Insufficient documentation

## 2013-07-28 DIAGNOSIS — Z7982 Long term (current) use of aspirin: Secondary | ICD-10-CM | POA: Insufficient documentation

## 2013-07-28 DIAGNOSIS — G40909 Epilepsy, unspecified, not intractable, without status epilepticus: Secondary | ICD-10-CM | POA: Insufficient documentation

## 2013-07-28 DIAGNOSIS — R1013 Epigastric pain: Secondary | ICD-10-CM | POA: Insufficient documentation

## 2013-07-28 DIAGNOSIS — F17200 Nicotine dependence, unspecified, uncomplicated: Secondary | ICD-10-CM | POA: Insufficient documentation

## 2013-07-28 DIAGNOSIS — Z8619 Personal history of other infectious and parasitic diseases: Secondary | ICD-10-CM | POA: Insufficient documentation

## 2013-07-28 DIAGNOSIS — I10 Essential (primary) hypertension: Secondary | ICD-10-CM | POA: Insufficient documentation

## 2013-07-28 DIAGNOSIS — F259 Schizoaffective disorder, unspecified: Secondary | ICD-10-CM | POA: Insufficient documentation

## 2013-07-28 DIAGNOSIS — Z79899 Other long term (current) drug therapy: Secondary | ICD-10-CM | POA: Insufficient documentation

## 2013-07-28 HISTORY — DX: Schizoaffective disorder, unspecified: F25.9

## 2013-07-28 LAB — URINALYSIS, ROUTINE W REFLEX MICROSCOPIC
Bilirubin Urine: NEGATIVE
Glucose, UA: NEGATIVE mg/dL
Leukocytes, UA: NEGATIVE
Protein, ur: 100 mg/dL — AB
pH: 7 (ref 5.0–8.0)

## 2013-07-28 LAB — CBC WITH DIFFERENTIAL/PLATELET
Basophils Absolute: 0 10*3/uL (ref 0.0–0.1)
Basophils Relative: 0 % (ref 0–1)
Eosinophils Absolute: 0 10*3/uL (ref 0.0–0.7)
Eosinophils Relative: 0 % (ref 0–5)
HCT: 44.4 % (ref 39.0–52.0)
Hemoglobin: 16 g/dL (ref 13.0–17.0)
Lymphocytes Relative: 9 % — ABNORMAL LOW (ref 12–46)
Lymphs Abs: 0.8 10*3/uL (ref 0.7–4.0)
MCH: 32.1 pg (ref 26.0–34.0)
MCHC: 36 g/dL (ref 30.0–36.0)
MCV: 89 fL (ref 78.0–100.0)
Monocytes Absolute: 0.5 10*3/uL (ref 0.1–1.0)
Monocytes Relative: 5 % (ref 3–12)
Neutro Abs: 8.1 10*3/uL — ABNORMAL HIGH (ref 1.7–7.7)
Neutrophils Relative %: 86 % — ABNORMAL HIGH (ref 43–77)
Platelets: 218 10*3/uL (ref 150–400)
RBC: 4.99 MIL/uL (ref 4.22–5.81)
RDW: 12.4 % (ref 11.5–15.5)
WBC: 9.3 10*3/uL (ref 4.0–10.5)

## 2013-07-28 LAB — COMPREHENSIVE METABOLIC PANEL WITH GFR
ALT: 33 U/L (ref 0–53)
AST: 34 U/L (ref 0–37)
Albumin: 4.4 g/dL (ref 3.5–5.2)
Alkaline Phosphatase: 108 U/L (ref 39–117)
BUN: 8 mg/dL (ref 6–23)
CO2: 26 meq/L (ref 19–32)
Calcium: 9.4 mg/dL (ref 8.4–10.5)
Chloride: 102 meq/L (ref 96–112)
Creatinine, Ser: 0.89 mg/dL (ref 0.50–1.35)
GFR calc Af Amer: >90 mL/min (ref >90)
GFR calc non Af Amer: 90 mL/min — ABNORMAL LOW (ref >90)
Glucose, Bld: 144 mg/dL — ABNORMAL HIGH (ref 70–99)
Potassium: 3.9 meq/L (ref 3.5–5.1)
Sodium: 140 meq/L (ref 135–145)
Total Bilirubin: 0.9 mg/dL (ref 0.3–1.2)
Total Protein: 7.9 g/dL (ref 6.0–8.3)

## 2013-07-28 LAB — URINE MICROSCOPIC-ADD ON

## 2013-07-28 MED ORDER — IOHEXOL 300 MG/ML  SOLN
25.0000 mL | INTRAMUSCULAR | Status: AC
  Administered 2013-07-28: 25 mL via ORAL

## 2013-07-28 MED ORDER — IOHEXOL 300 MG/ML  SOLN
80.0000 mL | Freq: Once | INTRAMUSCULAR | Status: AC | PRN
  Administered 2013-07-28: 80 mL via INTRAVENOUS

## 2013-07-28 MED ORDER — HYDROMORPHONE HCL PF 1 MG/ML IJ SOLN
0.5000 mg | Freq: Once | INTRAMUSCULAR | Status: AC
  Administered 2013-07-28: 0.5 mg via INTRAVENOUS
  Filled 2013-07-28: qty 1

## 2013-07-28 MED ORDER — ONDANSETRON HCL 4 MG/2ML IJ SOLN
4.0000 mg | Freq: Once | INTRAMUSCULAR | Status: AC
  Administered 2013-07-28: 4 mg via INTRAVENOUS
  Filled 2013-07-28: qty 2

## 2013-07-28 MED ORDER — SODIUM CHLORIDE 0.9 % IV SOLN
1000.0000 mL | Freq: Once | INTRAVENOUS | Status: AC
  Administered 2013-07-28: 1000 mL via INTRAVENOUS

## 2013-07-28 NOTE — ED Notes (Addendum)
Called [facility], staff member to come pick patient up and transport back to facility.

## 2013-07-28 NOTE — ED Notes (Signed)
Patient's family member left number to call if pt is d/c or admitted before he returns.  Family member's name: Maisie Fus Phone Number: [phone]

## 2013-07-28 NOTE — ED Notes (Signed)
Pt in restroom 

## 2013-07-28 NOTE — ED Notes (Signed)
Pt c/o N/V starting today; pt from group home; pt sts some abd pain

## 2013-07-28 NOTE — ED Notes (Signed)
CT notified pt drank first cup of contrast 

## 2013-07-28 NOTE — ED Notes (Signed)
Pt c/o n/v that started today, reports he has vomited X 4 today. Denies diarrhea/bld in vomit. sts he has been a little constipated. Nad, skin warm and dry, resp e/u. Reports he has been here before for psych issues and hearing voices. Denies hearing voices at this moment. Reports he isn't having anything SI/HI.

## 2013-07-28 NOTE — ED Provider Notes (Addendum)
CSN: [id number]     Arrival date & time 07/28/13  1222 History   First MD Initiated Contact with Patient 07/28/13 1233     Chief Complaint  Patient presents with  . Emesis  . Abdominal Pain   (Consider location/radiation/quality/duration/timing/severity/associated sxs/prior Treatment) HPI Patient presents with abdominal pain, nausea, vomiting. Patient states that this episode began within the past 12 hours.  Since onset is had persistent abdominal pain.  The pain is anterior, midline, severe, waxing/waning.  There is associated anorexia, vomiting.  Patient hasn't had a bowel movement essentially since the onset of symptoms. He does have flatus production still. He denies fevers, chills, cough, chest pain, lightheadedness, syncope. Patient saw his primary care physician, was referred here to rule out small bowel obstruction. Patient has multiple psychiatric diagnoses, lives in a group home.  He is here with a caregiver. Past Medical History  Diagnosis Date  . Hypertension   . Psychiatric disorder   . Hepatitis C   . Seizures   . Schizophrenia   . Bipolar affective   . Depression   . Schizoaffective disorder    History reviewed. No pertinent past surgical history. History reviewed. No pertinent family history. History  Substance Use Topics  . Smoking status: Current Every Day Smoker -- 0.50 packs/day for 3 years    Types: Cigarettes  . Smokeless tobacco: Never Used  . Alcohol Use: No    Review of Systems  Constitutional:       Per HPI, otherwise negative  HENT:       Per HPI, otherwise negative  Respiratory:       Per HPI, otherwise negative  Cardiovascular:       Per HPI, otherwise negative  Gastrointestinal: Positive for nausea and vomiting.  Endocrine:       Negative aside from HPI  Genitourinary:       Neg aside from HPI   Musculoskeletal:       Per HPI, otherwise negative  Skin: Negative.   Neurological: Negative for syncope.    Allergies  Review of  patient's allergies indicates no known allergies.  Home Medications   Current Outpatient Rx  Name  Route  Sig  Dispense  Refill  . aspirin EC 81 MG tablet   Oral   Take 81 mg by mouth daily.         . benztropine (COGENTIN) 0.5 MG tablet   Oral   Take 0.5 mg by mouth 2 (two) times daily.         . colchicine 0.6 MG tablet   Oral   Take 0.6 mg by mouth daily.         . haloperidol (HALDOL) 10 MG tablet   Oral   Take 10 mg by mouth 2 (two) times daily.         . haloperidol (HALDOL) 2 MG tablet   Oral   Take 2 mg by mouth at bedtime.         Marland Kitchen ibuprofen (ADVIL,MOTRIN) 600 MG tablet   Oral   Take 600 mg by mouth every 6 (six) hours as needed for pain.         Marland Kitchen lithium carbonate (LITHOBID) 300 MG CR tablet   Oral   Take 300 mg by mouth 2 (two) times daily.         Marland Kitchen thiamine (VITAMIN B-1) 100 MG tablet   Oral   Take 100 mg by mouth daily.  BP 125/78  Pulse 73  Temp(Src) 98.2 F (36.8 C) (Oral)  Resp 12  Wt 182 lb (82.555 kg)  SpO2 92% Physical Exam  Nursing note and vitals reviewed. Constitutional: He is oriented to person, place, and time. He appears well-developed. No distress.  HENT:  Head: Normocephalic and atraumatic.  Eyes: Conjunctivae and EOM are normal.  Cardiovascular: Normal rate and regular rhythm.   Pulmonary/Chest: Effort normal. No stridor. No respiratory distress.  Abdominal: Normal appearance. He exhibits no distension. There is tenderness in the epigastric area and periumbilical area.  Musculoskeletal: He exhibits no edema.  Neurological: He is alert and oriented to person, place, and time.  Skin: Skin is warm and dry.  Psychiatric: He has a normal mood and affect. His speech is tangential. He is slowed. Cognition and memory are impaired.    ED Course  Procedures (including critical care time) Labs Review Labs Reviewed  CBC WITH DIFFERENTIAL - Abnormal; Notable for the following:    Neutrophils Relative % 86 (*)     Neutro Abs 8.1 (*)    Lymphocytes Relative 9 (*)    All other components within normal limits  COMPREHENSIVE METABOLIC PANEL - Abnormal; Notable for the following:    Glucose, Bld 144 (*)    GFR calc non Af Amer 90 (*)    All other components within normal limits  URINALYSIS, ROUTINE W REFLEX MICROSCOPIC - Abnormal; Notable for the following:    Protein, ur 100 (*)    All other components within normal limits  LIPASE, BLOOD  URINE MICROSCOPIC-ADD ON  CG4 I-STAT (LACTIC ACID)   Imaging Review No results found.  EKG Interpretation   None       3:39 PM On exam the patient states that"pain is gone"and is passing substantial amounts of gas.  He denies any complaints.  I reviewed labs with him.  He requests discharge.  This seems reasonable.  On repeat physical exam the patient's abdomen is entirely soft, with no guarding, no rebound.  MDM  No diagnosis found. This patient presents with abdominal pain from his primary care physician's office.  With patient's history of no pain, nausea, vomiting, anorexia, bowel obstruction was a possibility.  Patient's labs were reassuring.  Patient's CT scan has some small bowel dilatation, but no overt evidence of bowel obstruction.  In addition to the patient's symptoms resolved entirely, and presents last, described the appetite, and denied any ongoing pain.  With no prior abdominal surgery, he has minimal risk profile, though this was an initial consideration.  With resolution of symptoms and return labs, and absence of fever or distress, he was discharged in stable condition with explicit return precautions.    Gerhard Munch, MD 07/28/13 1541  Gerhard Munch, MD 07/28/13 [phone]

## 2013-12-08 ENCOUNTER — Encounter (HOSPITAL_COMMUNITY): Payer: Self-pay | Admitting: Emergency Medicine

## 2013-12-08 ENCOUNTER — Emergency Department (HOSPITAL_COMMUNITY)
Admission: EM | Admit: 2013-12-08 | Discharge: 2013-12-08 | Disposition: A | Discharge status: Home / Self Care | Payer: Medicare Other | Attending: Emergency Medicine | Admitting: Emergency Medicine

## 2013-12-08 DIAGNOSIS — Z79899 Other long term (current) drug therapy: Secondary | ICD-10-CM | POA: Insufficient documentation

## 2013-12-08 DIAGNOSIS — F319 Bipolar disorder, unspecified: Secondary | ICD-10-CM | POA: Insufficient documentation

## 2013-12-08 DIAGNOSIS — F259 Schizoaffective disorder, unspecified: Secondary | ICD-10-CM | POA: Insufficient documentation

## 2013-12-08 DIAGNOSIS — Z7982 Long term (current) use of aspirin: Secondary | ICD-10-CM | POA: Insufficient documentation

## 2013-12-08 DIAGNOSIS — Z760 Encounter for issue of repeat prescription: Secondary | ICD-10-CM | POA: Insufficient documentation

## 2013-12-08 DIAGNOSIS — Z8619 Personal history of other infectious and parasitic diseases: Secondary | ICD-10-CM | POA: Insufficient documentation

## 2013-12-08 DIAGNOSIS — M79609 Pain in unspecified limb: Secondary | ICD-10-CM | POA: Insufficient documentation

## 2013-12-08 DIAGNOSIS — F17200 Nicotine dependence, unspecified, uncomplicated: Secondary | ICD-10-CM | POA: Insufficient documentation

## 2013-12-08 DIAGNOSIS — I10 Essential (primary) hypertension: Secondary | ICD-10-CM | POA: Insufficient documentation

## 2013-12-08 DIAGNOSIS — G40909 Epilepsy, unspecified, not intractable, without status epilepticus: Secondary | ICD-10-CM | POA: Insufficient documentation

## 2013-12-08 DIAGNOSIS — L608 Other nail disorders: Secondary | ICD-10-CM | POA: Insufficient documentation

## 2013-12-08 MED ORDER — HALOPERIDOL DECANOATE 100 MG/ML IM SOLN
100.0000 mg | Freq: Once | INTRAMUSCULAR | Status: AC
  Administered 2013-12-08: 100 mg via INTRAMUSCULAR
  Filled 2013-12-08: qty 1

## 2013-12-08 NOTE — ED Notes (Signed)
Pt showed nurse his toes, states they were painful.  Toenails needs cut.  Med tech will get some toenail clippers on the way back home and cut toenails for pt and make him an appt with podiatrist.

## 2013-12-08 NOTE — ED Notes (Signed)
Pt reports that he went to [facility] today to get Haldol injection that he gets every month and is due. He was told there was no one there to give him his injection and to come to the nearest hospital. Pt denies SI, HI, hallucinations. Pt also reports toe pain to L side and ingrown toenail. Pt denies any other complaints.

## 2013-12-08 NOTE — ED Provider Notes (Signed)
CSN: [id number]     Arrival date & time 12/08/13  1502 History  This chart was scribed for non-physician practitioner, Cleatrice Burke, PA-C, working with Jasper Riling. Alvino Chapel, MD by Celesta Gentile, ED Scribe. This patient was seen in room TR09C/TR09C and the patient's care was started at 5:09 PM.    Chief Complaint  Patient presents with  . Medication Refill    needs Haldol injection   The history is provided by the patient and a caregiver. No language interpreter was used.   HPI Comments: Daryl Campbell is a 63 y.o. male who presents to the Emergency Department for a Haldol injection.  Pt states he went to [facility] today, because he was due for a Haldol injection.  He states [facility] didn't have any vials of Haldol.  Pt states he gets 1 injection of Haldol per month.  Pt denies hallucinations currently.  Pt's med tech states he has been refusing his medications for the past couple of days which is typical for him when he needs his IM haldol.  Pt denies fever, chills, abdominal pain, and nausea.  Pt also complains of his toe nails being too long and causing pain.  Pt denies h/o diabetes.    Past Medical History  Diagnosis Date  . Hypertension   . Psychiatric disorder   . Hepatitis C   . Seizures   . Schizophrenia   . Bipolar affective   . Depression   . Schizoaffective disorder    History reviewed. No pertinent past surgical history. No family history on file. History  Substance Use Topics  . Smoking status: Current Every Day Smoker -- 0.50 packs/day for 3 years    Types: Cigarettes  . Smokeless tobacco: Never Used  . Alcohol Use: No    Review of Systems  Constitutional: Negative for fever and chills.  Respiratory: Negative for shortness of breath and wheezing.   Cardiovascular: Negative for chest pain.  Gastrointestinal: Negative for nausea, vomiting, abdominal pain and diarrhea.  Skin: Negative for color change and rash.  Neurological: Negative for headaches.   Psychiatric/Behavioral: Negative for hallucinations.  All other systems reviewed and are negative.    Allergies  Review of patient's allergies indicates no known allergies.  Home Medications   Current Outpatient Rx  Name  Route  Sig  Dispense  Refill  . aspirin EC 81 MG tablet   Oral   Take 81 mg by mouth daily.         . benztropine (COGENTIN) 0.5 MG tablet   Oral   Take 0.5 mg by mouth 2 (two) times daily.         . colchicine 0.6 MG tablet   Oral   Take 0.6 mg by mouth daily.         . haloperidol (HALDOL) 10 MG tablet   Oral   Take 10 mg by mouth 2 (two) times daily.         . haloperidol (HALDOL) 2 MG tablet   Oral   Take 2 mg by mouth at bedtime.         Marland Kitchen ibuprofen (ADVIL,MOTRIN) 600 MG tablet   Oral   Take 600 mg by mouth every 6 (six) hours as needed for pain.         Marland Kitchen lithium carbonate (LITHOBID) 300 MG CR tablet   Oral   Take 300 mg by mouth 2 (two) times daily.         Marland Kitchen thiamine (VITAMIN B-1) 100 MG tablet  Oral   Take 100 mg by mouth daily.          Triage Vitals: BP 111/95  Pulse 58  Temp(Src) 98.8 F (37.1 C) (Oral)  Resp 16  Ht 5\' 8"  (1.727 m)  Wt 180 lb (81.647 kg)  BMI 27.38 kg/m2  SpO2 96%  Physical Exam  Nursing note and vitals reviewed. Constitutional: He is oriented to person, place, and time. He appears well-developed and well-nourished. No distress.  HENT:  Head: Normocephalic and atraumatic.  Right Ear: External ear normal.  Left Ear: External ear normal.  Nose: Nose normal.  Eyes: Conjunctivae and EOM are normal. Right eye exhibits no discharge. Left eye exhibits no discharge.  Neck: Normal range of motion. Neck supple. No tracheal deviation present.  Cardiovascular: Normal rate, regular rhythm and normal heart sounds.  Exam reveals no gallop and no friction rub.   No murmur heard. Pulmonary/Chest: Effort normal and breath sounds normal. No stridor. No respiratory distress. He has no wheezes. He has no  rales. He exhibits no tenderness.  Abdominal: Soft. He exhibits no distension. There is no tenderness.  Musculoskeletal: Normal range of motion.  Neurological: He is alert and oriented to person, place, and time.  Skin: Skin is warm and dry. No rash noted. He is not diaphoretic.  Exceptionally long toe nails without any underlying paronychia erythema, induration, and fluctuance.    Psychiatric: He has a normal mood and affect. His behavior is normal.    ED Course  Procedures (including critical care time) DIAGNOSTIC STUDIES: Oxygen Saturation is 96% on RA, adequate by my interpretation.    COORDINATION OF CARE: 5:23 PM-Will order Haldol injection 100 mg.  Patient informed of current plan of treatment and evaluation and agrees with plan.    Labs Review Labs Reviewed - No data to display Imaging Review No results found.  MDM   Final diagnoses:  Medication refill   Patient presents to ED for medication refill. He needs his monthly injection of Haldol. He takes 100mg  every month and went to [facility] this morning. They were out of his medication and told him to come here. His caregiver confirms this story. Patient was given Haldol here. Additionally patient complains of long toenails. There is no ingrown nail as documented in the triage note. Encouraged patient to clip them. Discussed he may be seen by podiatry. Return instructions given. Vital signs stable for discharge. Discussed case with Dr. Alvino Chapel who agrees with plan. Patient / Family / Caregiver informed of clinical course, understand medical decision-making process, and agree with plan.   I personally performed the services described in this documentation, which was scribed in my presence. The recorded information has been reviewed and is accurate.        Elwyn Lade, PA-C 12/08/13 1747

## 2013-12-08 NOTE — Discharge Instructions (Signed)
Medication Refill, Emergency Department We have refilled your medication today as a courtesy to you. It is best for your medical care, however, to take care of getting refills done through your primary caregiver's office. They have your records and can do a better job of follow-up than we can in the emergency department. On maintenance medications, we often only prescribe enough medications to get you by until you are able to see your regular caregiver. This is a more expensive way to refill medications. In the future, please plan for refills so that you will not have to use the emergency department for this. Thank you for your help. Your help allows [place] to better take care of the daily emergencies that enter our department. Document Released: 12/07/2003 Document Revised: 11/12/2011 Document Reviewed: 08/20/2005 [facility] Patient Information 2014 [place], [place].   Emergency Department Resource Guide 1) Find a Doctor and Pay Out of Pocket Although you won't have to find out who is covered by your insurance plan, it is a good idea to ask around and get recommendations. You will then need to call the office and see if the doctor you have chosen will accept you as a new patient and what types of options they offer for patients who are self-pay. Some doctors offer discounts or will set up payment plans for their patients who do not have insurance, but you will need to ask so you aren't surprised when you get to your appointment.  2) Contact Your Local Health Department Not all health departments have doctors that can see patients for sick visits, but many do, so it is worth a call to see if yours does. If you don't know where your local health department is, you can check in your phone book. The CDC also has a tool to help you locate your state's health department, and many state websites also have listings of all of their local health departments.  3) Find a [facility] If your illness is not likely  to be very severe or complicated, you may want to try a walk in clinic. These are popping up all over the country in pharmacies, drugstores, and shopping centers. They're usually staffed by nurse practitioners or physician assistants that have been trained to treat common illnesses and complaints. They're usually fairly quick and inexpensive. However, if you have serious medical issues or chronic medical problems, these are probably not your best option.  No Primary Care Doctor: - Call Health Connect at  [phone] - they can help you locate a primary care doctor that  accepts your insurance, provides certain services, etc. - Physician Referral Service- [phone]  Chronic Pain Problems: Organization         Address  Phone   Notes  [facility]  [phone] Patients need to be referred by their primary care doctor.   Medication Assistance: Organization         Address  Phone   Notes  [facility] Medication [facility] [address] [place], [place] [address] [phone] --Must be a resident of [facility] -- Must have NO insurance coverage whatsoever (no Medicaid/ Medicare, etc.) -- The pt. MUST have a primary care doctor that directs their care regularly and follows them in the community   MedAssist  [phone]   [employer]  [phone]    Agencies that provide inexpensive medical care: Organization         Address  Phone   Notes  [facility]  [phone]   [facility]    [phone]   [facility] [place], [place] [address] [phone]   [place] [address] [place] [phone]   Planned Parenthood    [phone]   [facility]    [phone]   [place] and [place] [address], [place] Phone:  [phone], Fax:  [phone] Hours of Operation:  9 am - 6 pm, M-F.   Also accepts Medicaid/Medicare and self-pay.  [facility] for [place], [address], [place] Phone: [phone], Fax: [phone]. Hours of Operation:  8:30 am - 5:30 pm, M-F.  Also accepts Medicaid and self-pay.  [facility] [place] [address] [place] Phone: [phone]   [place], [place], [place] [phone], Ext. [phone] Mondays & Thursdays: 7-9 AM.  First 15 patients are seen on a first come, first serve basis.    [place] Providers:  Organization         Address  Phone   Notes  [facility] [address], [place] [phone] Also accepts self-pay patients.  [facility] [address], [place]  [phone]   [place], [address], [place] [phone]   [facility] [address], [place] [phone]   Lucianne Lei [address], [place]   [phone] Only accepts [place] Access [place] patients after they have their name applied to their card.   Self-Pay (no insurance) in [facility]:  Organization         Address  Phone   Notes  Sickle Cell Patients, [facility] [place] [phone]   [facility] [place] [phone]   [facility] [place]  [place], [place], [place] [phone]   [facility]/Dr. Osei-Bonsu  [address], [place] or [address], [place] [phone] Phone number for both [place] and [place] locations is the same.  [facility] [address] [place] [phone]   [facility] [address] [place] or [address] [phone]   [facility] [address]  [place] [phone], phone; [phone], fax Sees patients 1st and 3rd Saturday of every month.  Must not qualify for public or private insurance (i.e. Medicaid, Medicare, [place] Health Choice, Veterans' Benefits)  Household income should be no more than 200% of the poverty level The clinic cannot treat you if you are pregnant or think you are pregnant  Sexually transmitted diseases are not treated at the clinic.    Dental Care: Organization         Address  Phone  Notes  [facility] Department of [facility] [phone] Accepts children up to age 25 who are enrolled in [place] or [place]; pregnant women with a Medicaid card; and children who have applied for Medicaid or [place] Health Choice, but were declined, whose parents can pay a reduced fee at time of service.  [facility] Department of  [facility]  [address], [place] [phone] Accepts children up to age 84 who are enrolled in Medicaid or [place]; pregnant women with a Medicaid card; and children who have applied for Medicaid or [place] Health Choice, but were declined, whose parents can pay a reduced fee at time of service.  [place] Adult Dental Access PROGRAM  [place] [phone] Patients are seen by appointment only. Walk-ins are not accepted. [place] will see patients 40 years of age and older. Monday - Tuesday (8am-5pm) Most Wednesdays (8:30-5pm) $30 per visit, cash only  [facility] Adult Dental Access PROGRAM  [address], [facility] [phone] Patients are seen by appointment only. Walk-ins are not accepted. [place] will see patients 86 years of age and older. One Wednesday Evening (Monthly: Volunteer Based).  $30 per visit, cash only  [place]  [phone] for adults; Children under age 47, call Graduate Pediatric Dentistry at [phone].  Children aged 38-14, please call [phone] to request a pediatric application.  Dental services are provided in all areas of dental care including fillings, crowns and bridges, complete and partial dentures, implants, gum treatment, root canals, and extractions. Preventive care is also provided. Treatment is provided to both adults and children. Patients are selected via a lottery and there is often a waiting list.   [facility] [address], [place]  [phone] [email or web address]   [facility] [address] [place], [place] [phone], Ext. [phone] Second and Fourth Thursday of each month, opens at 6:30 AM; Clinic ends at 9 AM.  Patients are seen on a first-come first-served basis, and a limited number are seen during each clinic.   [facility]  [address] Hillard Danker [place], [place] [phone]   Eligibility Requirements You must have lived in [place], [place], or [place] counties for at least the last three months.   You cannot be eligible for state or federal sponsored [employer], including [employer], [place], or [employer].   You generally cannot be eligible for healthcare insurance through your employer.    How to apply: Eligibility screenings are held every Tuesday and Wednesday afternoon from 1:00 pm until 4:00 pm. You do not need an appointment for the interview!  [facility] [address], [place], [place]   [place]  [place] Department  [place]  [phone]    Behavioral Health Resources in the Community: Intensive Outpatient Programs Organization         Address  Phone  Notes  [place]. [address] [place], [place] [phone]   [facility] Outpatient [address], [place], [place]   ADS: Alcohol & Drug Svcs [address] [place], [place]   [place] [address]  [place], [place] or [phone]   Substance Abuse Resources Organization         Address  Phone  Notes  Alcohol and Drug Services  [phone]   [place]  [phone]   The [place]   Chinita Pester  [phone]   Residential & Outpatient Substance Abuse Program  [phone]   Psychological Services Organization         Address  Phone  Notes  [place]  [place]   [place] [address] [place] or [phone]    Mobile Crisis Teams Organization         Address  Phone  Notes  [facility], Mobile Crisis Care Unit  [phone]   [facility]ive Psychotherapeutic Services  [address] [place], [place]   [place] [address], [place] [phone]    Self-Help/Support Groups Organization         Address  Phone             Notes  [place]. of [place] - variety of support groups  [place] Call for more information  Narcotics Anonymous (NA), [facility] [address], [employer] [place]  2 meetings at this location   Special educational needs teacher         Address  Phone  Notes  [facility] [place],    [place]  [phone]   [facility]  [address] [place] [phone], [place], [place]   [place], [place] [phone] Admissions: 8am-3pm M-F  Incentives Substance [place] [address]    [place], [place] [phone]   [facility] [address] [place], [place], [place]   [facility] [address]  [place], [place]   [facility] Programs - Intensive Outpatient [address] Kristeen [address], [place], [place]     [facility] ([place].) [place].,  [place], [place] [phone] or [phone]   [facility]idential Treatment Services (RTS) [address], [place], [place] Accepts Medicaid  [facility] [place] [address],  [place] [phone] Substance Abuse/Addiction Treatment   [facility] Organization         Address  Phone  Notes  [facility]  [phone]   Domenic Schwab, PhD [address] Arlis [place], [place]   [phone] or [phone]   [place], [place] [phone]   [facility] [address], [place], [place] [phone] Insurance/Medicaid/sponsorship through [facility] and Families [address], [address]                                    [place], [place] [phone] [place] [address], [place] [phone]    Dr. Adele Schilder  [phone]   [facility] [place] Dept. 1) [address] [place] 2) [place] 3)  [place] [address], [place] [phone]  [phone]   [place] [phone] or [phone] (After Hours)

## 2013-12-08 NOTE — ED Notes (Signed)
Pt went to [facility] to get Haldol 100 mg injection.  Med tech supervisor here with pt and she was told [facility] unable to keep vials longer than 28 days, so they did not have any med at that facility.  Pharmacy unable to fill med  Until tomorrow and can't get appt until 12-15-13.  Med tech states pt needs injection today.  Pt has been acting out and only does this when medication due.  Pt is getting agitated and throwing things, walking off from techs.

## 2013-12-09 NOTE — ED Provider Notes (Signed)
Medical screening examination/treatment/procedure(s) were performed by non-physician practitioner and as supervising physician I was immediately available for consultation/collaboration.   EKG Interpretation None       Jasper Riling. Alvino Chapel, MD 12/09/13 2105

## 2013-12-27 ENCOUNTER — Emergency Department (HOSPITAL_COMMUNITY)
Admission: EM | Admit: 2013-12-27 | Discharge: 2013-12-27 | Disposition: A | Discharge status: Home / Self Care | Payer: Medicare Other | Attending: Emergency Medicine | Admitting: Emergency Medicine

## 2013-12-27 ENCOUNTER — Encounter (HOSPITAL_COMMUNITY): Payer: Self-pay | Admitting: Emergency Medicine

## 2013-12-27 DIAGNOSIS — I10 Essential (primary) hypertension: Secondary | ICD-10-CM | POA: Insufficient documentation

## 2013-12-27 DIAGNOSIS — R443 Hallucinations, unspecified: Secondary | ICD-10-CM | POA: Insufficient documentation

## 2013-12-27 DIAGNOSIS — Z79899 Other long term (current) drug therapy: Secondary | ICD-10-CM | POA: Insufficient documentation

## 2013-12-27 DIAGNOSIS — Z91199 Patient's noncompliance with other medical treatment and regimen due to unspecified reason: Secondary | ICD-10-CM

## 2013-12-27 DIAGNOSIS — F17200 Nicotine dependence, unspecified, uncomplicated: Secondary | ICD-10-CM | POA: Insufficient documentation

## 2013-12-27 DIAGNOSIS — Z9119 Patient's noncompliance with other medical treatment and regimen: Secondary | ICD-10-CM

## 2013-12-27 DIAGNOSIS — R059 Cough, unspecified: Secondary | ICD-10-CM | POA: Insufficient documentation

## 2013-12-27 DIAGNOSIS — F259 Schizoaffective disorder, unspecified: Secondary | ICD-10-CM | POA: Insufficient documentation

## 2013-12-27 DIAGNOSIS — F319 Bipolar disorder, unspecified: Secondary | ICD-10-CM | POA: Insufficient documentation

## 2013-12-27 DIAGNOSIS — Z7982 Long term (current) use of aspirin: Secondary | ICD-10-CM | POA: Insufficient documentation

## 2013-12-27 DIAGNOSIS — R05 Cough: Secondary | ICD-10-CM | POA: Insufficient documentation

## 2013-12-27 DIAGNOSIS — G40909 Epilepsy, unspecified, not intractable, without status epilepticus: Secondary | ICD-10-CM | POA: Insufficient documentation

## 2013-12-27 DIAGNOSIS — Z8619 Personal history of other infectious and parasitic diseases: Secondary | ICD-10-CM | POA: Insufficient documentation

## 2013-12-27 DIAGNOSIS — F29 Unspecified psychosis not due to a substance or known physiological condition: Secondary | ICD-10-CM | POA: Insufficient documentation

## 2013-12-27 DIAGNOSIS — Z760 Encounter for issue of repeat prescription: Secondary | ICD-10-CM | POA: Insufficient documentation

## 2013-12-27 LAB — CBC
HCT: 43.4 % (ref 39.0–52.0)
HEMOGLOBIN: 15.6 g/dL (ref 13.0–17.0)
MCH: 31.7 pg (ref 26.0–34.0)
MCHC: 35.9 g/dL (ref 30.0–36.0)
MCV: 88.2 fL (ref 78.0–100.0)
Platelets: 218 10*3/uL (ref 150–400)
RBC: 4.92 MIL/uL (ref 4.22–5.81)
RDW: 12.3 % (ref 11.5–15.5)
WBC: 4.9 10*3/uL (ref 4.0–10.5)

## 2013-12-27 LAB — COMPREHENSIVE METABOLIC PANEL
ALT: 39 U/L (ref 0–53)
AST: 36 U/L (ref 0–37)
Albumin: 4.3 g/dL (ref 3.5–5.2)
Alkaline Phosphatase: 99 U/L (ref 39–117)
BUN: 9 mg/dL (ref 6–23)
CALCIUM: 9.7 mg/dL (ref 8.4–10.5)
CO2: 24 mEq/L (ref 19–32)
CREATININE: 1 mg/dL (ref 0.50–1.35)
Chloride: 102 mEq/L (ref 96–112)
GFR calc non Af Amer: 79 mL/min — ABNORMAL LOW (ref >90)
GLUCOSE: 96 mg/dL (ref 70–99)
Potassium: 3.6 mEq/L — ABNORMAL LOW (ref 3.7–5.3)
SODIUM: 141 meq/L (ref 137–147)
TOTAL PROTEIN: 8 g/dL (ref 6.0–8.3)
Total Bilirubin: 1.6 mg/dL — ABNORMAL HIGH (ref 0.3–1.2)

## 2013-12-27 LAB — RAPID URINE DRUG SCREEN, HOSP PERFORMED
Amphetamines: NOT DETECTED
Barbiturates: NOT DETECTED
Benzodiazepines: NOT DETECTED
COCAINE: NOT DETECTED
OPIATES: NOT DETECTED
TETRAHYDROCANNABINOL: NOT DETECTED

## 2013-12-27 LAB — ETHANOL

## 2013-12-27 LAB — LITHIUM LEVEL: Lithium Lvl: <0.25 mEq/L — ABNORMAL LOW (ref 0.80–1.40)

## 2013-12-27 LAB — ACETAMINOPHEN LEVEL: Acetaminophen (Tylenol), Serum: <15 ug/mL (ref 10–30)

## 2013-12-27 LAB — SALICYLATE LEVEL

## 2013-12-27 MED ORDER — ASPIRIN EC 81 MG PO TBEC
81.0000 mg | DELAYED_RELEASE_TABLET | Freq: Every day | ORAL | Status: DC
  Administered 2013-12-27: 81 mg via ORAL
  Filled 2013-12-27: qty 1

## 2013-12-27 MED ORDER — ALUM & MAG HYDROXIDE-SIMETH 200-200-20 MG/5ML PO SUSP: 30.0000 mL | ORAL | Status: DC | PRN

## 2013-12-27 MED ORDER — HALOPERIDOL 5 MG PO TABS
10.0000 mg | ORAL_TABLET | Freq: Two times a day (BID) | ORAL | Status: DC
  Administered 2013-12-27: 10 mg via ORAL
  Filled 2013-12-27: qty 2

## 2013-12-27 MED ORDER — HALOPERIDOL 2 MG PO TABS: 2.0000 mg | ORAL_TABLET | Freq: Every day | ORAL | Status: DC

## 2013-12-27 MED ORDER — VITAMIN B-1 100 MG PO TABS
100.0000 mg | ORAL_TABLET | Freq: Every day | ORAL | Status: DC
  Administered 2013-12-27: 12:00:00 100 mg via ORAL
  Filled 2013-12-27: qty 1

## 2013-12-27 MED ORDER — ONDANSETRON HCL 4 MG PO TABS: 4.0000 mg | ORAL_TABLET | Freq: Three times a day (TID) | ORAL | Status: DC | PRN

## 2013-12-27 MED ORDER — BENZTROPINE MESYLATE 1 MG PO TABS
0.5000 mg | ORAL_TABLET | Freq: Two times a day (BID) | ORAL | Status: DC
  Administered 2013-12-27: 0.5 mg via ORAL
  Filled 2013-12-27: qty 1

## 2013-12-27 MED ORDER — COLCHICINE 0.6 MG PO TABS
0.6000 mg | ORAL_TABLET | Freq: Every day | ORAL | Status: DC
  Administered 2013-12-27: 0.6 mg via ORAL
  Filled 2013-12-27: qty 1

## 2013-12-27 MED ORDER — LITHIUM CARBONATE ER 300 MG PO TBCR
300.0000 mg | EXTENDED_RELEASE_TABLET | Freq: Two times a day (BID) | ORAL | Status: DC
  Administered 2013-12-27: 300 mg via ORAL
  Filled 2013-12-27 (×2): qty 1

## 2013-12-27 MED ORDER — IBUPROFEN 200 MG PO TABS: 600.0000 mg | ORAL_TABLET | Freq: Three times a day (TID) | ORAL | Status: DC | PRN

## 2013-12-27 NOTE — ED Notes (Signed)
Pt is stating that he has not had his meds for 3 days.  States it has been a while since he heard voices.  God is telling him to help the world.  To take care of himself.  Pt denies SI/HI.  Pt is tearful at times and speaks to God.

## 2013-12-27 NOTE — ED Provider Notes (Signed)
CSN: [id number]     Arrival date & time 12/27/13  1937 History   First MD Initiated Contact with Patient 12/27/13 [phone]     Chief Complaint  Patient presents with  . Medical Clearance     (Consider location/radiation/quality/duration/timing/severity/associated sxs/prior Treatment) HPI Comments: Patient is a 63 year old male past medical history significant for hypertension, hepatitis C, seizures, schizophrenia, bipolar affective disorder, depression, schizoaffective disorder presented to the emergency department for auditory hallucinations. He states that he is been hearing God's voice for years. He states that he has "normal conversations with him." Patient states she's also felt increasingly depressed but denies any suicidal or homicidal ideations. He denies any self injury. Denies any alcohol or recreational drug use. He has no physical complaints at this time. Patient has had written note about his complaint with him.    Past Medical History  Diagnosis Date  . Hypertension   . Psychiatric disorder   . Hepatitis C   . Seizures   . Schizophrenia   . Bipolar affective   . Depression   . Schizoaffective disorder    History reviewed. No pertinent past surgical history. History reviewed. No pertinent family history. History  Substance Use Topics  . Smoking status: Current Every Day Smoker -- 0.50 packs/day for 3 years    Types: Cigarettes  . Smokeless tobacco: Never Used  . Alcohol Use: No    Review of Systems  Constitutional: Negative for fever and chills.  Respiratory: Positive for cough. Negative for shortness of breath.   Cardiovascular: Negative for chest pain.  Gastrointestinal: Negative for nausea, vomiting and abdominal pain.  Psychiatric/Behavioral: Positive for hallucinations. Negative for suicidal ideas and self-injury.  All other systems reviewed and are negative.     Allergies  Review of patient's allergies indicates no known allergies.  Home Medications    Prior to Admission medications   Medication Sig Start Date End Date Taking? Authorizing Provider  aspirin EC 81 MG tablet Take 81 mg by mouth daily.    Historical Provider, MD  benztropine (COGENTIN) 0.5 MG tablet Take 0.5 mg by mouth 2 (two) times daily.    Historical Provider, MD  colchicine 0.6 MG tablet Take 0.6 mg by mouth daily.    Historical Provider, MD  haloperidol (HALDOL) 10 MG tablet Take 10 mg by mouth 2 (two) times daily.    Historical Provider, MD  haloperidol (HALDOL) 2 MG tablet Take 2 mg by mouth at bedtime.    Historical Provider, MD  ibuprofen (ADVIL,MOTRIN) 600 MG tablet Take 600 mg by mouth every 6 (six) hours as needed for pain.    Historical Provider, MD  lithium carbonate (LITHOBID) 300 MG CR tablet Take 300 mg by mouth 2 (two) times daily.    Historical Provider, MD  thiamine (VITAMIN B-1) 100 MG tablet Take 100 mg by mouth daily.    Historical Provider, MD   BP 139/92  Pulse 66  Temp(Src) 98.2 F (36.8 C) (Oral)  Resp 18  SpO2 100% Physical Exam  Nursing note and vitals reviewed. Constitutional: He is oriented to person, place, and time. He appears well-developed and well-nourished. No distress.  HENT:  Head: Normocephalic and atraumatic.  Right Ear: External ear normal.  Left Ear: External ear normal.  Nose: Nose normal.  Mouth/Throat: Oropharynx is clear and moist. No oropharyngeal exudate.  Eyes: Conjunctivae and EOM are normal. Pupils are equal, round, and reactive to light.  Neck: Normal range of motion. Neck supple.  Cardiovascular: Normal rate, regular rhythm, normal heart  sounds and intact distal pulses.   Pulmonary/Chest: Effort normal and breath sounds normal. No respiratory distress.  Abdominal: Soft. There is no tenderness.  Neurological: He is alert and oriented to person, place, and time. He has normal strength. No cranial nerve deficit. Gait normal. GCS eye subscore is 4. GCS verbal subscore is 5. GCS motor subscore is 6.  Sensation  grossly intact.  No pronator drift.  Bilateral heel-knee-shin intact.  Skin: Skin is warm and dry. He is not diaphoretic.  Psychiatric: His speech is normal. He is actively hallucinating. He expresses no homicidal and no suicidal ideation. He expresses no suicidal plans and no homicidal plans.    ED Course  Procedures (including critical care time) Medications - No data to display  Labs Review Labs Reviewed  COMPREHENSIVE METABOLIC PANEL - Abnormal; Notable for the following:    Potassium 3.6 (*)    Total Bilirubin 1.6 (*)    GFR calc non Af Amer 79 (*)    All other components within normal limits  SALICYLATE LEVEL - Abnormal; Notable for the following:    Salicylate Lvl <9.9 (*)    All other components within normal limits  LITHIUM LEVEL - Abnormal; Notable for the following:    Lithium Lvl <0.25 (*)    All other components within normal limits  ACETAMINOPHEN LEVEL  CBC  ETHANOL  URINE RAPID DRUG SCREEN (HOSP PERFORMED)    Imaging Review No results found.   EKG Interpretation None      MDM   Final diagnoses:  Medication management  Psychosis    Filed Vitals:   12/27/13 1216  BP: 139/92  Pulse: 66  Temp:   Resp:    Afebrile, NAD, non-toxic appearing, AAOx4.  Patient with hallucinations. No suicidal or homicidal ideations. No self-harm. No recreational drug or alcohol use. Patient has been off of his medications for approximately one month, requires TTS consult with likely medication recommendations. Labs reviewed. Months patient has been evaluated by TTS can likely be discharged back to assisted living facility.  TTS evaluated patient and made appropriate recommendations for medication changes. Patient is stable for discharge back to assisted living facility. Patient is agreeable to this plan. Patient d/w with Dr. Venora Maples, agrees with plan.    Harlow Mares, PA-C 12/27/13 1556

## 2013-12-27 NOTE — ED Notes (Signed)
[facility] education sheet given.  Instructions reviewed

## 2013-12-27 NOTE — Progress Notes (Signed)
CSW met with pt to discuss resources.  Pt states he has not been taking his medications recently but would like to get back on them. CSW provided supportive counseling. Per pt request, CSW provided a list of ACT teams in the area. Pt states he will [facility].  CSW called group home. Group home states they are coming presently to bring him home.  Rochele Pages,     ED CSW  phone: [phone] 12:21pm

## 2013-12-27 NOTE — ED Provider Notes (Signed)
Medical screening examination/treatment/procedure(s) were conducted as a shared visit with non-physician practitioner(s) and myself.  I personally evaluated the patient during the encounter.   EKG Interpretation None      Overall well-appearing.  TTS involved but I think the patient just needed medication adjustments.  Please see psychiatry consultation note for complete details.  Patient was discharged back to his facility with followup with [facility].  I believe this is reasonable.  Hoy Morn, MD 12/27/13 [phone]

## 2013-12-27 NOTE — ED Notes (Signed)
Pt transporting back to group home.  All counseling complete and instructions given.

## 2013-12-27 NOTE — Discharge Instructions (Signed)
Hallucinations and Delusions You seem to be having hallucinations and/or delusions. You may be hearing voices that no one else can hear. This can seem very real to you. You may be having thoughts and fears that do not make sense to others. This condition can be due to mental disease like schizophrenia. It may be caused by a medical condition, such as an infection or electrolyte disturbance. These symptoms are also seen in drug abusers, especially those who use crack cocaine and amphetamines. Drugs like PCP, LSD, MDMA, peyote, and psilocybin can also cause frightening hallucinations and loss of control. If your symptoms are due to drug abuse, your mental state should improve as the drug(s) leave your system. Someone you trust should be with you until you are better to protect you and calm your fears. Often tranquilizers are very helpful at controlling hallucinations, anxiety, and destructive behavior. Getting a proper diet and enough sleep is important to recovery. If your symptoms are not due to drugs, or do not improve over several days after stopping drug use, you need further medical or mental health care. SEEK IMMEDIATE MEDICAL CARE IF:   Your symptoms get worse, especially if you think your life is in danger  You have violent or destructive thoughts. Recovery is possible, but you have to get proper treatment and avoid drugs that are known to cause you trouble. Document Released: 09/27/2004 Document Revised: 11/12/2011 Document Reviewed: 08/20/2005 [facility] Patient Information 2014 [place].  Psychosis Psychosis refers to a severe lack of understanding with reality. During a psychotic episode, you are not able to think clearly. During a psychotic episode, your responses and emotions are inappropriate and do not coincide with what is actually happening. You often have false beliefs about what is happening or who you are (delusions), and you may see, hear, taste, smell, or feel things that are  not present (hallucinations). Psychosis is usually a severe symptom of a very serious mental health (psychiatric) condition, but it can sometimes be the result of a medical condition. CAUSES   Psychiatric conditions, such as:  Schizophrenia.  Bipolar disorder.  Depression.  Personality disorders.  Alcohol or drug abuse.  Medical conditions, such as:  Brain injury.  Brain tumor.  Dementia.  Brain diseases, such as Alzheimer's, Parkinson's, or Huntington's disease.  Neurological diseases, such as epilepsy.  Genetic disorders.  Metabolic disorders.  Infections that affect the brain.  Certain prescription drugs.  Stroke. SYMPTOMS   Unable to think or speak clearly or respond appropriately.  Disorganized thinking (thoughts jump from one thought to another).  Severe inappropriate behavior.  Delusions may include:  A strong belief that is odd, unrealistic, or false.  Feeling extremely fearful or suspicious (paranoid).  Believing you are someone else, have high importance, or have an altered identity.  Hallucinations. DIAGNOSIS   Mental health evaluation.  Physical exam.  Blood tests.  Computerized magnetic scan (MRI) or other brain scans. TREATMENT  Your caregiver will recommend a course of treatment that depends on the cause of the psychosis. Treatment may include:  Monitoring and supportive care in the hospital.  Taking medicines (antipsychotic medicine) to reduce symptoms and balance chemicals in the brain.  Taking medicines to manage underlying mental health conditions.  Therapy and other supportive programs outside of the hospital.  Treating an underlying medical condition. If the cause of the psychosis can be treated or corrected, the outlook is good. Without treatment, psychotic episodes can cause danger to yourself or others. Treatment may be short-term or lifelong.  HOME CARE INSTRUCTIONS   Take all medicines as directed. This is  important.  Use a pillbox or write down your medicine schedule to make sure you are taking them.  Check with your caregiver before using over-the-counter medicines, herbs, or supplements.  Seek individual and family support through therapy and mental health education (psychoeducation) programs. These will help you manage symptoms and side effects of medicines, learn life skills, and maintain a healthy routine.  Maintain a healthy lifestyle.  Exercise regularly.  Avoid alcohol and drugs.  Learn ways to reduce stress and cope with stress, such as yoga and meditation.  Talk about your feelings with family members or caregivers.  Make time for yourself to do things you enjoy.  Know the early warning signs of psychosis. Your caregiver will recommend steps to take when you notice symptoms such as:  Feeling anxious or preoccupied.  Having racing thoughts.  Changes in your interest in life and relationships.  Follow up with your caregivers for continued outpatient treatment as directed. SEEK MEDICAL CARE IF:   Medicines do not seem to be helping.  You hear voices telling you to do things.  You see, smell, or feel things that are not there.  You feel hopeless and overwhelmed.  You feel extremely fearful and suspicious that something will harm you.  You feel like you cannot leave your house.  You have trouble taking care of yourself.  You experience side effects of medicines, such as changes in sleep patterns, dizziness, weight gain, restlessness, movement changes, muscle spasms, or tremors. SEEK IMMEDIATE MEDICAL CARE IF:  Severe psychotic symptoms present a safety issue (such as an urge to hurt yourself or others). MAKE SURE YOU:   Understand these instructions.  Will watch your condition.  Will get help right away if you are not doing well or get worse. [place] [facility]: [email or web address] Document Released: 02/07/2010 Document  Revised: 11/12/2011 Document Reviewed: 02/07/2010 [facility] Patient Information 2014 [place], [place].

## 2013-12-27 NOTE — Consult Note (Addendum)
[facility] Consult   Reason for Consult:  Not taking medication; psychosis Referring Physician:  EDP  Daryl Campbell is an 63 y.o. male. Total Time spent with patient: 45 minutes  Assessment: AXIS I:  Psychotic Disorder NOS AXIS II:  Deferred AXIS III:   Past Medical History  Diagnosis Date  . Hypertension   . Psychiatric disorder   . Hepatitis C   . Seizures   . Schizophrenia   . Bipolar affective   . Depression   . Schizoaffective disorder    AXIS IV:  other psychosocial or environmental problems AXIS V:  61-70 mild symptoms  Plan:  No evidence of imminent risk to self or others at present.   Patient does not meet criteria for psychiatric inpatient admission. Supportive therapy provided about ongoing stressors. Discussed crisis plan, support from social network, calling 911, coming to the Emergency Department, and calling Suicide Hotline.  Subjective:   Daryl Campbell is a 63 y.o. male patient.  HPI:  Patient  States "I stopped taking my medicine.  I am suppose to get Haldol once a month; I don't know the last time I took it.  The medicine was making my stomach hurt; so when I stopped taking one I stopped taking all of them.  I go to [facility].  Yes I told the doctor that I stopped taking of them not all of them.  He told me my lithium level was low and that I needed to start back taking them.  Yes I hear voices.  I've been hearing 'em for twenty years.  I hear God talking to me.  No it is not loud or screaming right now; it's just in my head; Yes the haldol helped.  I was at the group home and they told me I could come to the hospital by police or by ambulance.  The police brought me.  No I am not suicidal; I don't want to hurt anyone.  They just want me to get my medicine adjusted. Patient denies suicidal/homicidal ideation and paranoia. Patient does endorse auditory hallucinations that are not command and not as loud as they can get.  Patient has been off of PO  medications for 2 weeks  HPI Elements:   Location:  Off of medications. Quality:  hallucinations auditory. Severity:  hallucinations auditory. Timing:  2 weeks.  Past Psychiatric History: Past Medical History  Diagnosis Date  . Hypertension   . Psychiatric disorder   . Hepatitis C   . Seizures   . Schizophrenia   . Bipolar affective   . Depression   . Schizoaffective disorder     reports that he has been smoking Cigarettes.  He has a 1.5 pack-year smoking history. He has never used smokeless tobacco. He reports that he uses illicit drugs. He reports that he does not drink alcohol. History reviewed. No pertinent family history.         Allergies:  No Known Allergies  ACT Assessment Complete:  No:   Past Psychiatric History: Diagnosis:  Psychotic Disorder NOS  Hospitalizations:  Yes  Outpatient Care:  [facility]  Substance Abuse Care:  Denies  Self-Mutilation:  Denies  Suicidal Attempts:  Denies  Homicidal Behaviors:  Denies   Violent Behaviors:  Denies   Place of Residence:  [place] Marital Status:  Single Employed/Unemployed:  Unemployed Education:   Family Supports:  Yes Objective: Blood pressure 125/97, pulse 67, temperature 98.2 F (36.8 C), temperature source Oral, resp. rate 18, SpO2 97.00%.There is  no weight on file to calculate BMI. Results for orders placed during the hospital encounter of 12/27/13 (from the past 72 hour(s))  URINE RAPID DRUG SCREEN (HOSP PERFORMED)     Status: None   Collection Time    12/27/13 10:05 AM      Result Value Ref Range   Opiates NONE DETECTED  NONE DETECTED   Cocaine NONE DETECTED  NONE DETECTED   Benzodiazepines NONE DETECTED  NONE DETECTED   Amphetamines NONE DETECTED  NONE DETECTED   Tetrahydrocannabinol NONE DETECTED  NONE DETECTED   Barbiturates NONE DETECTED  NONE DETECTED   Comment:            DRUG SCREEN FOR MEDICAL PURPOSES     ONLY.  IF CONFIRMATION IS NEEDED     FOR ANY PURPOSE, NOTIFY LAB     WITHIN 5 DAYS.                 LOWEST DETECTABLE LIMITS     FOR URINE DRUG SCREEN     Drug Class       Cutoff (ng/mL)     Amphetamine      1000     Barbiturate      200     Benzodiazepine   540     Tricyclics       981     Opiates          300     Cocaine          300     THC              50  CBC     Status: None   Collection Time    12/27/13 10:21 AM      Result Value Ref Range   WBC 4.9  4.0 - 10.5 K/uL   RBC 4.92  4.22 - 5.81 MIL/uL   Hemoglobin 15.6  13.0 - 17.0 g/dL   HCT 43.4  39.0 - 52.0 %   MCV 88.2  78.0 - 100.0 fL   MCH 31.7  26.0 - 34.0 pg   MCHC 35.9  30.0 - 36.0 g/dL   RDW 12.3  11.5 - 15.5 %   Platelets 218  150 - 400 K/uL   Labs are reviewed lithium level below therapeutic level.  No critical values noted.  Medications reviewed and no changes made.  Patient will need to follow up with  [facility] for changes in medication.  It is not time for the patient to have Haldol injection last injection was given 12/08/13 and should be given again 01/07/14.    Current Facility-Administered Medications  Medication Dose Route Frequency Provider Last Rate Last Dose  . alum & mag hydroxide-simeth (MAALOX/MYLANTA) 200-200-20 MG/5ML suspension 30 mL  30 mL Oral PRN Jennifer L Piepenbrink, PA-C      . aspirin EC tablet 81 mg  81 mg Oral Daily Hoy Morn, MD      . benztropine (COGENTIN) tablet 0.5 mg  0.5 mg Oral BID Hoy Morn, MD      . colchicine tablet 0.6 mg  0.6 mg Oral Daily Hoy Morn, MD      . haloperidol (HALDOL) tablet 10 mg  10 mg Oral BID Hoy Morn, MD      . haloperidol (HALDOL) tablet 2 mg  2 mg Oral QHS Hoy Morn, MD      . ibuprofen (ADVIL,MOTRIN) tablet 600 mg  600 mg Oral Q8H PRN Anderson Malta  L Piepenbrink, PA-C      . lithium carbonate (LITHOBID) CR tablet 300 mg  300 mg Oral BID Hoy Morn, MD      . ondansetron [facility]) tablet 4 mg  4 mg Oral Q8H PRN Jennifer L Piepenbrink, PA-C      . thiamine (VITAMIN B-1) tablet 100 mg  100 mg Oral Daily Hoy Morn,  MD       Current Outpatient Prescriptions  Medication Sig Dispense Refill  . aspirin EC 81 MG tablet Take 81 mg by mouth daily.      . benztropine (COGENTIN) 0.5 MG tablet Take 0.5 mg by mouth 2 (two) times daily.      . colchicine 0.6 MG tablet Take 0.6 mg by mouth daily.      . haloperidol (HALDOL) 10 MG tablet Take 10 mg by mouth 2 (two) times daily.      . haloperidol (HALDOL) 2 MG tablet Take 2 mg by mouth at bedtime.      Marland Kitchen ibuprofen (ADVIL,MOTRIN) 600 MG tablet Take 600 mg by mouth every 6 (six) hours as needed for pain.      Marland Kitchen lithium carbonate (LITHOBID) 300 MG CR tablet Take 300 mg by mouth 2 (two) times daily.      Marland Kitchen thiamine (VITAMIN B-1) 100 MG tablet Take 100 mg by mouth daily.        Psychiatric Specialty Exam:     Blood pressure 125/97, pulse 67, temperature 98.2 F (36.8 C), temperature source Oral, resp. rate 18, SpO2 97.00%.There is no weight on file to calculate BMI.  General Appearance: Casual  Eye Contact::  Good  Speech:  Clear and Coherent and Normal Rate  Volume:  Normal  Mood:  "I just need to get my medicine adjusted"  Affect:  Appropriate and Congruent  Thought Process:  Circumstantial and Goal Directed  Orientation:  Full (Time, Place, and Person)  Thought Content:  Hallucinations: Auditory  Suicidal Thoughts:  No  Homicidal Thoughts:  No  Memory:  Immediate;   Fair Recent;   Fair  Judgement:  Fair  Insight:  Present  Psychomotor Activity:  Normal  Concentration:  Fair  Recall:  [employer] [place]  Language: Fair  Akathisia:  No  Handed:  Right  AIMS (if indicated):     Assets:  Communication Skills Desire for Improvement Housing Social Support  Sleep:      Musculoskeletal: Strength & Muscle Tone: within normal limits Gait & Station: normal Patient leans: N/A  Treatment Plan Summary: Follow up with outpatient provider  Disposition:  SW to speak with group home staff to have patient to follow up with [facility] and have  medications changes and adjustments done.  Patient can be discharged back to group home.   Shuvon Rankin, FNP-BC 12/27/2013 11:03 AM   Discharge Assessment     Demographic Factors:  Male and Caucasian  Total Time spent with patient: 15 minutes  Psychiatric Specialty Exam: Same as above  Musculoskeletal: Same as above  Mental Status Per Nursing Assessment::   On Admission:     Current Mental Status by Physician: Patient denies suicidal/homicidal ideation and paranoia  Loss Factors: NA  Historical Factors: NA  Risk Reduction Factors:   Positive social support and Living in [facility]  Continued Clinical Symptoms:  Previous Psychiatric Diagnoses and Treatments  Cognitive Features That Contribute To Risk:  Loss of executive function    Suicide Risk:  Minimal: No identifiable suicidal ideation.  Patients  presenting with no risk factors but with morbid ruminations; may be classified as minimal risk based on the severity of the depressive symptoms  Discharge Diagnoses: Same as above  Plan Of Care/Follow-up recommendations:  Activity:  Resume usual activity Diet:  Resume usual diet Other:  Follow up with [facility] for medication mangagement and changes  Is patient on multiple antipsychotic therapies at discharge:  No   Has Patient had three or more failed trials of antipsychotic monotherapy by history:  No  Recommended Plan for Multiple Antipsychotic Therapies: NA   Shuvon Rankin, FNP-BC 12/27/2013, 12:11 PM   I have personally seen the patient and agreed with the findings and involved in the treatment plan. Berniece Andreas, MD

## 2013-12-27 NOTE — ED Notes (Signed)
Per [employer], pt is voluntary.  Pt has been hearing voices.  Pt has hx of same.  Picked up at residence.  Pt lives at assisted adult living facility.  Did not express thoughts of SI.  No thoughts of HI.  Having conversations with God in police car in route.

## 2013-12-27 NOTE — ED Notes (Signed)
Pt has multicolor coat white shirt tan pants black shoes pt wanded 1 belonging bag placed in locker 26

## 2014-02-24 ENCOUNTER — Encounter: Payer: Self-pay | Admitting: *Deleted

## 2014-03-23 ENCOUNTER — Encounter (HOSPITAL_COMMUNITY): Payer: Self-pay | Admitting: Emergency Medicine

## 2014-03-23 ENCOUNTER — Emergency Department (HOSPITAL_COMMUNITY)
Admission: EM | Admit: 2014-03-23 | Discharge: 2014-03-23 | Disposition: A | Discharge status: Home / Self Care | Payer: Medicare Other | Attending: Emergency Medicine | Admitting: Emergency Medicine

## 2014-03-23 DIAGNOSIS — I10 Essential (primary) hypertension: Secondary | ICD-10-CM | POA: Diagnosis not present

## 2014-03-23 DIAGNOSIS — Z79899 Other long term (current) drug therapy: Secondary | ICD-10-CM | POA: Diagnosis not present

## 2014-03-23 DIAGNOSIS — Z8619 Personal history of other infectious and parasitic diseases: Secondary | ICD-10-CM | POA: Diagnosis not present

## 2014-03-23 DIAGNOSIS — F319 Bipolar disorder, unspecified: Secondary | ICD-10-CM | POA: Diagnosis not present

## 2014-03-23 DIAGNOSIS — F17200 Nicotine dependence, unspecified, uncomplicated: Secondary | ICD-10-CM | POA: Diagnosis not present

## 2014-03-23 DIAGNOSIS — Z8659 Personal history of other mental and behavioral disorders: Secondary | ICD-10-CM | POA: Diagnosis not present

## 2014-03-23 DIAGNOSIS — Z7982 Long term (current) use of aspirin: Secondary | ICD-10-CM | POA: Insufficient documentation

## 2014-03-23 DIAGNOSIS — R52 Pain, unspecified: Secondary | ICD-10-CM | POA: Diagnosis not present

## 2014-03-23 DIAGNOSIS — F259 Schizoaffective disorder, unspecified: Secondary | ICD-10-CM | POA: Insufficient documentation

## 2014-03-23 DIAGNOSIS — M25562 Pain in left knee: Secondary | ICD-10-CM

## 2014-03-23 DIAGNOSIS — M25569 Pain in unspecified knee: Secondary | ICD-10-CM | POA: Diagnosis present

## 2014-03-23 MED ORDER — ACETAMINOPHEN 325 MG PO TABS: 650.0000 mg | ORAL_TABLET | Freq: Four times a day (QID) | ORAL | Status: DC | PRN
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
## 2014-03-23 NOTE — Discharge Instructions (Signed)
Call for a follow up appointment with a Family or Primary Care Provider.  Return if Symptoms worsen.   Take medication as prescribed.  Do not take an anti-inflammatory such as Ibuprofen without consulting a physician when taking your lithium. Ice your knee 3 times a day, elevate when you are not standing or walking.

## 2014-03-23 NOTE — ED Provider Notes (Signed)
PA needs to finish chart   [name], MD 03/23/14 1528
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
## 2014-04-24 ENCOUNTER — Emergency Department (HOSPITAL_COMMUNITY)
Admission: EM | Admit: 2014-04-24 | Discharge: 2014-04-24 | Disposition: A | Discharge status: Home / Self Care | Payer: Medicare Other | Source: Home / Self Care | Attending: Emergency Medicine | Admitting: Emergency Medicine

## 2014-04-24 ENCOUNTER — Emergency Department (HOSPITAL_COMMUNITY): Payer: Medicare Other

## 2014-04-24 ENCOUNTER — Encounter (HOSPITAL_COMMUNITY): Payer: Self-pay | Admitting: Emergency Medicine

## 2014-04-24 DIAGNOSIS — I951 Orthostatic hypotension: Secondary | ICD-10-CM

## 2014-04-24 DIAGNOSIS — F17200 Nicotine dependence, unspecified, uncomplicated: Secondary | ICD-10-CM | POA: Diagnosis present

## 2014-04-24 DIAGNOSIS — F259 Schizoaffective disorder, unspecified: Secondary | ICD-10-CM | POA: Diagnosis present

## 2014-04-24 DIAGNOSIS — S0990XA Unspecified injury of head, initial encounter: Secondary | ICD-10-CM

## 2014-04-24 DIAGNOSIS — E860 Dehydration: Secondary | ICD-10-CM | POA: Diagnosis present

## 2014-04-24 DIAGNOSIS — I10 Essential (primary) hypertension: Secondary | ICD-10-CM | POA: Diagnosis present

## 2014-04-24 DIAGNOSIS — Z9181 History of falling: Secondary | ICD-10-CM | POA: Diagnosis not present

## 2014-04-24 DIAGNOSIS — F319 Bipolar disorder, unspecified: Secondary | ICD-10-CM | POA: Diagnosis present

## 2014-04-24 DIAGNOSIS — B1920 Unspecified viral hepatitis C without hepatic coma: Secondary | ICD-10-CM | POA: Diagnosis present

## 2014-04-24 DIAGNOSIS — E876 Hypokalemia: Secondary | ICD-10-CM | POA: Diagnosis present

## 2014-04-24 DIAGNOSIS — E872 Acidosis, unspecified: Secondary | ICD-10-CM | POA: Diagnosis present

## 2014-04-24 DIAGNOSIS — R55 Syncope and collapse: Secondary | ICD-10-CM | POA: Diagnosis present

## 2014-04-24 DIAGNOSIS — A419 Sepsis, unspecified organism: Secondary | ICD-10-CM | POA: Diagnosis present

## 2014-04-24 DIAGNOSIS — S8392XA Sprain of unspecified site of left knee, initial encounter: Secondary | ICD-10-CM

## 2014-04-24 DIAGNOSIS — W19XXXA Unspecified fall, initial encounter: Secondary | ICD-10-CM | POA: Diagnosis present

## 2014-04-24 DIAGNOSIS — R4182 Altered mental status, unspecified: Secondary | ICD-10-CM | POA: Diagnosis not present

## 2014-04-24 DIAGNOSIS — Z79899 Other long term (current) drug therapy: Secondary | ICD-10-CM | POA: Diagnosis not present

## 2014-04-24 DIAGNOSIS — F411 Generalized anxiety disorder: Secondary | ICD-10-CM | POA: Diagnosis present

## 2014-04-24 LAB — LITHIUM LEVEL: LITHIUM LVL: 0.73 meq/L — AB (ref 0.80–1.40)

## 2014-04-24 LAB — CBC WITH DIFFERENTIAL/PLATELET
BASOS ABS: 0 10*3/uL (ref 0.0–0.1)
BASOS PCT: 0 % (ref 0–1)
Eosinophils Absolute: 0.1 10*3/uL (ref 0.0–0.7)
Eosinophils Relative: 2 % (ref 0–5)
HEMATOCRIT: 36.5 % — AB (ref 39.0–52.0)
Hemoglobin: 12.4 g/dL — ABNORMAL LOW (ref 13.0–17.0)
Lymphocytes Relative: 17 % (ref 12–46)
Lymphs Abs: 1.2 10*3/uL (ref 0.7–4.0)
MCH: 30.3 pg (ref 26.0–34.0)
MCHC: 34 g/dL (ref 30.0–36.0)
MCV: 89.2 fL (ref 78.0–100.0)
Monocytes Absolute: 0.6 10*3/uL (ref 0.1–1.0)
Monocytes Relative: 8 % (ref 3–12)
NEUTROS ABS: 5 10*3/uL (ref 1.7–7.7)
NEUTROS PCT: 73 % (ref 43–77)
PLATELETS: 181 10*3/uL (ref 150–400)
RBC: 4.09 MIL/uL — ABNORMAL LOW (ref 4.22–5.81)
RDW: 12.6 % (ref 11.5–15.5)
WBC: 6.9 10*3/uL (ref 4.0–10.5)

## 2014-04-24 LAB — BASIC METABOLIC PANEL
ANION GAP: 11 (ref 5–15)
BUN: 14 mg/dL (ref 6–23)
CALCIUM: 9.3 mg/dL (ref 8.4–10.5)
CO2: 25 mEq/L (ref 19–32)
CREATININE: 1.05 mg/dL (ref 0.50–1.35)
Chloride: 107 mEq/L (ref 96–112)
GFR, EST AFRICAN AMERICAN: 85 mL/min — AB (ref >90)
GFR, EST NON AFRICAN AMERICAN: 74 mL/min — AB (ref >90)
Glucose, Bld: 116 mg/dL — ABNORMAL HIGH (ref 70–99)
Potassium: 4.2 mEq/L (ref 3.7–5.3)
Sodium: 143 mEq/L (ref 137–147)

## 2014-04-24 LAB — I-STAT TROPONIN, ED: TROPONIN I, POC: 0 ng/mL (ref 0.00–0.08)

## 2014-04-24 MED ORDER — ACETAMINOPHEN 325 MG PO TABS
650.0000 mg | ORAL_TABLET | Freq: Once | ORAL | Status: AC
  Administered 2014-04-24: 650 mg via ORAL
  Filled 2014-04-24: qty 2

## 2014-04-24 MED ORDER — SODIUM CHLORIDE 0.9 % IV BOLUS (SEPSIS)
1000.0000 mL | Freq: Once | INTRAVENOUS | Status: AC
  Administered 2014-04-24: 1000 mL via INTRAVENOUS

## 2014-04-24 MED ORDER — TETANUS-DIPHTH-ACELL PERTUSSIS 5-2.5-18.5 LF-MCG/0.5 IM SUSP
0.5000 mL | Freq: Once | INTRAMUSCULAR | Status: AC
  Administered 2014-04-24: 0.5 mL via INTRAMUSCULAR
  Filled 2014-04-24: qty 0.5

## 2014-04-24 MED ORDER — SODIUM CHLORIDE 0.9 % IV BOLUS (SEPSIS)
1000.0000 mL | Freq: Once | INTRAVENOUS | Status: AC
Start: 2014-04-24 — End: 2014-04-24
  Administered 2014-04-24: 1000 mL via INTRAVENOUS

## 2014-04-24 NOTE — ED Notes (Signed)
Pt returned from CT °

## 2014-04-24 NOTE — ED Notes (Signed)
Pt comfortable with discharge and follow up instructions. No prescirptions. Report also called to [place] at the [facility], verbalizes understanding.  Cab called for patient.

## 2014-04-24 NOTE — ED Notes (Signed)
Dr. Christy Gentles made aware of Orthostatic VS. Pt mentating well.
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
## 2014-04-24 NOTE — Discharge Instructions (Signed)
YOU WILL NEED TO HOLD YOUR HALDOL AND SPEAK TO YOUR PSYCHIATRIST FOR STARTING NEW MEDICATIONS DUE TO PROLONGED QT ON EKG   You have had a head injury which does not appear to require admission at this time. A concussion is a state of changed mental ability from trauma.  SEEK IMMEDIATE MEDICAL ATTENTION IF: There is confusion or drowsiness (although children frequently become drowsy after injury).  You cannot awaken the injured person.  There is nausea (feeling sick to your stomach) or continued, forceful vomiting.  You notice dizziness or unsteadiness which is getting worse, or inability to walk.  You have convulsions or unconsciousness.  You experience severe, persistent headaches not relieved by Tylenol. (Do not take aspirin as this impairs clotting abilities). Take other pain medications only as directed.  You cannot use arms or legs normally.  There are changes in pupil sizes. (This is the black center in the colored part of the eye)  There is clear or bloody discharge from the nose or ears.  Change in speech, vision, swallowing, or understanding.  Localized weakness, numbness, tingling, or change in bowel or bladder control.

## 2014-04-24 NOTE — ED Notes (Addendum)
Pt presents via [facility] from group home - [facility], with c/o fall. Pt was seen walking on the sidewalk and tripped and fell. Pt is unable to state year/month but knows the president, oriented to self, place, and situation, slightly dysarthric speech. Pt has abrasion to right hand and right knee.  Unsure of patients baseline mental status per EMS. Denies LOC

## 2014-04-24 NOTE — ED Notes (Signed)
Pt to xray at this time.

## 2014-04-26 ENCOUNTER — Encounter (HOSPITAL_COMMUNITY): Payer: Self-pay | Admitting: Emergency Medicine

## 2014-04-26 ENCOUNTER — Emergency Department (HOSPITAL_COMMUNITY): Payer: Medicare Other

## 2014-04-26 ENCOUNTER — Inpatient Hospital Stay (HOSPITAL_COMMUNITY)
Admission: EM | Admit: 2014-04-26 | Discharge: 2014-04-29 | DRG: 872 | Disposition: A | Discharge status: Home / Self Care | Payer: Medicare Other | Attending: Internal Medicine | Admitting: Internal Medicine

## 2014-04-26 DIAGNOSIS — I10 Essential (primary) hypertension: Secondary | ICD-10-CM | POA: Diagnosis present

## 2014-04-26 DIAGNOSIS — W19XXXA Unspecified fall, initial encounter: Secondary | ICD-10-CM | POA: Diagnosis present

## 2014-04-26 DIAGNOSIS — F17200 Nicotine dependence, unspecified, uncomplicated: Secondary | ICD-10-CM | POA: Diagnosis present

## 2014-04-26 DIAGNOSIS — Z79899 Other long term (current) drug therapy: Secondary | ICD-10-CM | POA: Diagnosis not present

## 2014-04-26 DIAGNOSIS — F411 Generalized anxiety disorder: Secondary | ICD-10-CM | POA: Diagnosis present

## 2014-04-26 DIAGNOSIS — E872 Acidosis, unspecified: Secondary | ICD-10-CM | POA: Diagnosis present

## 2014-04-26 DIAGNOSIS — R410 Disorientation, unspecified: Secondary | ICD-10-CM

## 2014-04-26 DIAGNOSIS — I959 Hypotension, unspecified: Secondary | ICD-10-CM | POA: Diagnosis present

## 2014-04-26 DIAGNOSIS — I951 Orthostatic hypotension: Secondary | ICD-10-CM

## 2014-04-26 DIAGNOSIS — A419 Sepsis, unspecified organism: Principal | ICD-10-CM | POA: Diagnosis present

## 2014-04-26 DIAGNOSIS — R9431 Abnormal electrocardiogram [ECG] [EKG]: Secondary | ICD-10-CM

## 2014-04-26 DIAGNOSIS — R6510 Systemic inflammatory response syndrome (SIRS) of non-infectious origin without acute organ dysfunction: Secondary | ICD-10-CM

## 2014-04-26 DIAGNOSIS — F319 Bipolar disorder, unspecified: Secondary | ICD-10-CM | POA: Diagnosis present

## 2014-04-26 DIAGNOSIS — F259 Schizoaffective disorder, unspecified: Secondary | ICD-10-CM | POA: Diagnosis present

## 2014-04-26 DIAGNOSIS — F18121 Inhalant abuse with intoxication delirium: Secondary | ICD-10-CM

## 2014-04-26 DIAGNOSIS — R55 Syncope and collapse: Secondary | ICD-10-CM | POA: Diagnosis present

## 2014-04-26 DIAGNOSIS — E860 Dehydration: Secondary | ICD-10-CM | POA: Diagnosis present

## 2014-04-26 DIAGNOSIS — R7989 Other specified abnormal findings of blood chemistry: Secondary | ICD-10-CM

## 2014-04-26 DIAGNOSIS — G40909 Epilepsy, unspecified, not intractable, without status epilepticus: Secondary | ICD-10-CM

## 2014-04-26 DIAGNOSIS — R4182 Altered mental status, unspecified: Secondary | ICD-10-CM | POA: Diagnosis present

## 2014-04-26 DIAGNOSIS — Z9181 History of falling: Secondary | ICD-10-CM

## 2014-04-26 DIAGNOSIS — E876 Hypokalemia: Secondary | ICD-10-CM | POA: Diagnosis present

## 2014-04-26 DIAGNOSIS — B1920 Unspecified viral hepatitis C without hepatic coma: Secondary | ICD-10-CM | POA: Diagnosis present

## 2014-04-26 LAB — URINALYSIS, ROUTINE W REFLEX MICROSCOPIC
Bilirubin Urine: NEGATIVE
Glucose, UA: NEGATIVE mg/dL
Hgb urine dipstick: NEGATIVE
Ketones, ur: NEGATIVE mg/dL
Leukocytes, UA: NEGATIVE
Nitrite: NEGATIVE
Protein, ur: 100 mg/dL — AB
Specific Gravity, Urine: 1.015 (ref 1.005–1.030)
Urobilinogen, UA: 1 mg/dL (ref 0.0–1.0)
pH: 6.5 (ref 5.0–8.0)

## 2014-04-26 LAB — BASIC METABOLIC PANEL
Anion gap: 19 — ABNORMAL HIGH (ref 5–15)
BUN: 11 mg/dL (ref 6–23)
CO2: 20 mEq/L (ref 19–32)
Calcium: 9.7 mg/dL (ref 8.4–10.5)
Chloride: 102 mEq/L (ref 96–112)
Creatinine, Ser: 1.34 mg/dL (ref 0.50–1.35)
GFR calc Af Amer: 64 mL/min — ABNORMAL LOW (ref >90)
GFR calc non Af Amer: 55 mL/min — ABNORMAL LOW (ref >90)
Glucose, Bld: 148 mg/dL — ABNORMAL HIGH (ref 70–99)
Potassium: 3.6 mEq/L — ABNORMAL LOW (ref 3.7–5.3)
Sodium: 141 mEq/L (ref 137–147)

## 2014-04-26 LAB — TROPONIN I: Troponin I: <0.3 ng/mL (ref <0.30)

## 2014-04-26 LAB — CBC WITH DIFFERENTIAL/PLATELET
Basophils Absolute: 0 10*3/uL (ref 0.0–0.1)
Basophils Relative: 0 % (ref 0–1)
Eosinophils Absolute: 0.1 10*3/uL (ref 0.0–0.7)
Eosinophils Relative: 2 % (ref 0–5)
HCT: 39.3 % (ref 39.0–52.0)
Hemoglobin: 13.9 g/dL (ref 13.0–17.0)
Lymphocytes Relative: 18 % (ref 12–46)
Lymphs Abs: 1 10*3/uL (ref 0.7–4.0)
MCH: 32 pg (ref 26.0–34.0)
MCHC: 35.4 g/dL (ref 30.0–36.0)
MCV: 90.3 fL (ref 78.0–100.0)
Monocytes Absolute: 0.4 10*3/uL (ref 0.1–1.0)
Monocytes Relative: 7 % (ref 3–12)
Neutro Abs: 3.8 10*3/uL (ref 1.7–7.7)
Neutrophils Relative %: 73 % (ref 43–77)
Platelets: 192 10*3/uL (ref 150–400)
RBC: 4.35 MIL/uL (ref 4.22–5.81)
RDW: 12.8 % (ref 11.5–15.5)
WBC: 5.3 10*3/uL (ref 4.0–10.5)

## 2014-04-26 LAB — CK TOTAL AND CKMB (NOT AT ARMC)
CK TOTAL: 144 U/L (ref 7–232)
CK, MB: 3.4 ng/mL (ref 0.3–4.0)
RELATIVE INDEX: 2.4 (ref 0.0–2.5)

## 2014-04-26 LAB — LACTIC ACID, PLASMA: LACTIC ACID, VENOUS: 1.8 mmol/L (ref 0.5–2.2)

## 2014-04-26 LAB — RAPID URINE DRUG SCREEN, HOSP PERFORMED
Amphetamines: NOT DETECTED
Barbiturates: NOT DETECTED
Benzodiazepines: NOT DETECTED
Cocaine: NOT DETECTED
Opiates: NOT DETECTED
Tetrahydrocannabinol: NOT DETECTED

## 2014-04-26 LAB — ETHANOL: Alcohol, Ethyl (B): <11 mg/dL (ref 0–11)

## 2014-04-26 LAB — LITHIUM LEVEL: Lithium Lvl: 0.6 mEq/L — ABNORMAL LOW (ref 0.80–1.40)

## 2014-04-26 LAB — TSH: TSH: 2.07 u[IU]/mL (ref 0.350–4.500)

## 2014-04-26 LAB — CK: Total CK: 96 U/L (ref 7–232)

## 2014-04-26 LAB — MRSA PCR SCREENING: MRSA BY PCR: NEGATIVE

## 2014-04-26 LAB — I-STAT CG4 LACTIC ACID, ED: Lactic Acid, Venous: 4.99 mmol/L — ABNORMAL HIGH (ref 0.5–2.2)

## 2014-04-26 LAB — URINE MICROSCOPIC-ADD ON

## 2014-04-26 MED ORDER — TETANUS-DIPHTH-ACELL PERTUSSIS 5-2.5-18.5 LF-MCG/0.5 IM SUSP
0.5000 mL | Freq: Once | INTRAMUSCULAR | Status: AC
  Administered 2014-04-26: 0.5 mL via INTRAMUSCULAR
  Filled 2014-04-26: qty 0.5

## 2014-04-26 MED ORDER — SODIUM CHLORIDE 0.9 % IV BOLUS (SEPSIS)
2000.0000 mL | Freq: Once | INTRAVENOUS | Status: AC
  Administered 2014-04-26: 2000 mL via INTRAVENOUS

## 2014-04-26 MED ORDER — LITHIUM CARBONATE ER 300 MG PO TBCR
300.0000 mg | EXTENDED_RELEASE_TABLET | Freq: Two times a day (BID) | ORAL | Status: DC
  Administered 2014-04-26 – 2014-04-29 (×6): 300 mg via ORAL
  Filled 2014-04-26 (×9): qty 1

## 2014-04-26 MED ORDER — PIPERACILLIN-TAZOBACTAM 3.375 G IVPB 30 MIN
3.3750 g | Freq: Once | INTRAVENOUS | Status: AC
  Administered 2014-04-26: 3.375 g via INTRAVENOUS
  Filled 2014-04-26: qty 50

## 2014-04-26 MED ORDER — ENOXAPARIN SODIUM 40 MG/0.4ML ~~LOC~~ SOLN
40.0000 mg | SUBCUTANEOUS | Status: DC
  Administered 2014-04-26 – 2014-04-28 (×3): 40 mg via SUBCUTANEOUS
  Filled 2014-04-26 (×4): qty 0.4

## 2014-04-26 MED ORDER — SODIUM CHLORIDE 0.9 % IV SOLN
INTRAVENOUS | Status: DC
  Administered 2014-04-26: 18:00:00 via INTRAVENOUS

## 2014-04-26 MED ORDER — PIPERACILLIN-TAZOBACTAM 3.375 G IVPB
3.3750 g | Freq: Three times a day (TID) | INTRAVENOUS | Status: DC
  Administered 2014-04-26 – 2014-04-29 (×7): 3.375 g via INTRAVENOUS
  Filled 2014-04-26 (×10): qty 50

## 2014-04-26 MED ORDER — SODIUM CHLORIDE 0.9 % IV SOLN
INTRAVENOUS | Status: DC
  Administered 2014-04-26: 1000 mL via INTRAVENOUS
  Administered 2014-04-27 – 2014-04-28 (×3): via INTRAVENOUS

## 2014-04-26 MED ORDER — LORAZEPAM 1 MG PO TABS
1.0000 mg | ORAL_TABLET | Freq: Once | ORAL | Status: AC
  Administered 2014-04-26: 1 mg via ORAL
  Filled 2014-04-26: qty 1

## 2014-04-26 MED ORDER — ONDANSETRON HCL 4 MG PO TABS
4.0000 mg | ORAL_TABLET | Freq: Four times a day (QID) | ORAL | Status: DC | PRN
Start: 2014-04-26 — End: 2014-04-29

## 2014-04-26 MED ORDER — VANCOMYCIN HCL IN DEXTROSE 1-5 GM/200ML-% IV SOLN
1000.0000 mg | Freq: Once | INTRAVENOUS | Status: AC
  Administered 2014-04-26: 1000 mg via INTRAVENOUS
  Filled 2014-04-26: qty 200

## 2014-04-26 MED ORDER — ONDANSETRON HCL 4 MG/2ML IJ SOLN: 4.0000 mg | Freq: Four times a day (QID) | INTRAMUSCULAR | Status: DC | PRN

## 2014-04-26 MED ORDER — VANCOMYCIN HCL IN DEXTROSE 750-5 MG/150ML-% IV SOLN
750.0000 mg | Freq: Two times a day (BID) | INTRAVENOUS | Status: DC
  Filled 2014-04-26 (×2): qty 150
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
## 2014-04-26 NOTE — Progress Notes (Signed)
ANTIBIOTIC CONSULT NOTE - INITIAL  Pharmacy Consult for Vancomycin & Zosyn Indication: rule out sepsis  No Known Allergies  Patient Measurements: Height: 5\' 8"  (172.7 cm) Weight: 175 lb 11.3 oz (79.7 kg) IBW/kg (Calculated) : 68.4  Vital Signs: Temp: 98.7 F (37.1 C) (08/24 1700) Temp src: Oral (08/24 1700) BP: 121/53 mmHg (08/24 1700) Pulse Rate: 69 (08/24 1700) Intake/Output from previous day:   Intake/Output from this shift:   Labs:  Recent Labs  04/24/14 1859 04/26/14 1405  WBC 6.9 5.3  HGB 12.4* 13.9  PLT 181 192  CREATININE 1.05 1.34   Estimated Creatinine Clearance: 54.6 ml/min (by C-G formula based on Cr of 1.34). No results found for this basename: VANCOTROUGH, Corlis Leak, VANCORANDOM, [place], GENTPEAK, GENTRANDOM, TOBRATROUGH, TOBRAPEAK, TOBRARND, AMIKACINPEAK, AMIKACINTROU, AMIKACIN,  in the last 72 hours   Microbiology: Recent Results (from the past 720 hour(s))  MRSA PCR SCREENING     Status: None   Collection Time    04/26/14  6:04 PM      Result Value Ref Range Status   MRSA by PCR NEGATIVE  NEGATIVE Final   Comment:            The GeneXpert MRSA Assay (FDA     approved for NASAL specimens     only), is one component of a     comprehensive MRSA colonization     surveillance program. It is not     intended to diagnose MRSA     infection nor to guide or     monitor treatment for     MRSA infections.   Medical History: Past Medical History  Diagnosis Date  . Hypertension   . Psychiatric disorder   . Hepatitis C   . Seizures   . Schizophrenia   . Bipolar affective   . Depression   . Schizoaffective disorder    Medications:  Scheduled:  . enoxaparin (LOVENOX) injection  40 mg Subcutaneous Q24H  . lithium carbonate  300 mg Oral BID  . piperacillin-tazobactam (ZOSYN)  IV  3.375 g Intravenous 3 times per day  . [START ON 04/27/2014] vancomycin  750 mg Intravenous Q12H   Anti-infectives   Start     Dose/Rate Route Frequency Ordered  Stop   04/27/14 0400  vancomycin (VANCOCIN) IVPB 750 mg/150 ml premix     750 mg 150 mL/hr over 60 Minutes Intravenous Every 12 hours 04/26/14 1955     04/26/14 2200  piperacillin-tazobactam (ZOSYN) IVPB 3.375 g     3.375 g 12.5 mL/hr over 240 Minutes Intravenous 3 times per day 04/26/14 1943     04/26/14 1445  piperacillin-tazobactam (ZOSYN) IVPB 3.375 g     3.375 g 100 mL/hr over 30 Minutes Intravenous  Once 04/26/14 1442 04/26/14 1602   04/26/14 1445  vancomycin (VANCOCIN) IVPB 1000 mg/200 mL premix     1,000 mg 200 mL/hr over 60 Minutes Intravenous  Once 04/26/14 1442 04/26/14 1741     Assessment: 75 yoM brought in by EMS: multiple falls, history of substance abuse - UDS negative, Ethanol level ordered. Febrile, lactic acid elevated, hypotension.  Vancomycin 1gm and Zosyn 3.375 x1 in ED  Pharmacy to dose Vanc & Zosyn for sepsis  U/A unremarkable, Head CT: no acute process  Goal of Therapy:  Vancomycin trough level 15-20 mcg/ml  Plan:   Vancomycin 1gm x1, then 750mg  q12  Zosyn 3.375gm q8hr-4 hr infusion  Follow blood culture, narrow abx when appropriate  Minda Ditto PharmD Pager [phone] 04/26/2014, 8:06  PM.

## 2014-04-26 NOTE — H&P (Signed)
[facility] History and Physical  Daryl Campbell IRS:[id number] DOB: 1951-05-07 DOA: 04/26/2014  Referring physician: ED physician PCP: No PCP Per Patient   Chief Complaint: Altered mental status   HPI:  63 yo male with history of multiple psychiatric illness including depression, bipolar disorder, PSA including tobacco and unclear what other substances, presented to [facility] ED via EMS after found by bystander who witness pt stumbling and falling down. Pt was alert enough at that time to report fall several times today prior to this admission and said his head was hurting. He has denies chest pain or shortness of breath, abd or urinary concerns.   In ED, pt noted to be confused, vitals notable for SBP in 60's, Temp 100.57F, HR in 120's. TR[facility] asked to admit to SDU for further evaluation of presumptive sepsis of an unclear etiology.   Assessment and Plan: Active Problems: Sepsis  - pt meets criteria fr sepsis given hypotension (SBP in 60's), Temp 100.1 F, HR 110 - 120's, lactic acid >4 - source is unclear, UA and CXR unrevealing  - will admit to SDU for now - place on empiric ABX Vanc and Zosyn for now and taper down as clinically indicated  - provide IVF, repeat lactic acid - follow up on blood cultures  PSA - UDS negative  - will also check alcohol level  - consider psych consult in AM Bipolar - continue Lithium and consult psych in am Hypokalemia - supplement and repeat level in am  Radiological Exams on Admission: Dg Chest 2 View  04/26/2014   CLINICAL DATA:  Fever.  Syncope.  EXAM: CHEST  2 VIEW  COMPARISON:  11/28/2012  FINDINGS: Lateral view degraded by patient arm position. Midline trachea. Normal heart size and mediastinal contours. No pleural effusion or pneumothorax. Scattered areas of scarring, including within the left upper and both lower lobes.  IMPRESSION: No acute cardiopulmonary disease.   Electronically Signed   By: Abigail Miyamoto M.D.   On: 04/26/2014 14:39   Ct  Head Wo Contrast  04/26/2014   CLINICAL DATA:  Altered mental status, gait disturbance, fell  EXAM: CT HEAD WITHOUT CONTRAST  TECHNIQUE: Contiguous axial images were obtained from the base of the skull through the vertex without intravenous contrast.  COMPARISON:  04/24/2014  FINDINGS: No evidence of infarct, hemorrhage, or extra-axial fluid. Stable moderate diffuse atrophy and mild deep white matter low attenuation. No hydrocephalus. Calvarium intact.  IMPRESSION: No acute findings.   Electronically Signed   By: Skipper Cliche M.D.   On: 04/26/2014 14:37   Ct Head Wo Contrast  04/24/2014   CLINICAL DATA:  Fall.  EXAM: CT HEAD WITHOUT CONTRAST  TECHNIQUE: Contiguous axial images were obtained from the base of the skull through the vertex without intravenous contrast.  COMPARISON:  05/31/2013  FINDINGS: No acute cortical infarct, hemorrhage, or mass lesion ispresent. Ventricles are of normal size. No significant extra-axial fluid collection is present. The paranasal sinuses andmastoid air cells are clear. The osseous skull is intact.  IMPRESSION: Normal brain.   Electronically Signed   By: Kerby Moors M.D.   On: 04/24/2014 18:45   Dg Knee Complete 4 Views Left  04/24/2014   CLINICAL DATA:  Fall.  EXAM: LEFT KNEE - COMPLETE 4+ VIEW  COMPARISON:  None.  FINDINGS: There is no evidence of fracture, dislocation, or joint effusion. There is mild degenerative change and sharpening of the tibial spines. No joint effusion noted.  IMPRESSION: 1. No acute findings. 2. Mild degenerative  change.   Electronically Signed   By: Kerby Moors M.D.   On: 04/24/2014 19:37     Code Status: Full Family Communication: No family at bedside  Disposition Plan: Admit for further evaluation     Review of Systems:  Unable to obtain due to AMS    Past Medical History  Diagnosis Date  . Hypertension   . Psychiatric disorder   . Hepatitis C   . Seizures   . Schizophrenia   . Bipolar affective   . Depression   .  Schizoaffective disorder     History reviewed. No pertinent past surgical history.  Social History:  reports that he has been smoking Cigarettes.  He has a 3 pack-year smoking history. He has never used smokeless tobacco. He reports that he uses illicit drugs (Marijuana). He reports that he does not drink alcohol.  No Known Allergies  Unable to obtain family medical history due to AMS  Prior to Admission medications   Medication Sig Start Date End Date Taking? Authorizing Provider  benztropine (COGENTIN) 0.5 MG tablet Take 0.5 mg by mouth daily.    Yes Historical Provider, MD  colchicine 0.6 MG tablet Take 0.6 mg by mouth daily.   Yes Historical Provider, MD  lithium carbonate (LITHOBID) 300 MG CR tablet Take 300 mg by mouth 2 (two) times daily.   Yes Historical Provider, MD  traZODone (DESYREL) 50 MG tablet Take 50 mg by mouth at bedtime.   Yes Historical Provider, MD    Physical Exam: Filed Vitals:   04/26/14 1600 04/26/14 1615 04/26/14 1630 04/26/14 1645  BP: 126/66 102/84 118/74   Pulse: 74 73 72 69  Temp:      TempSrc:      Resp: 17 23 16 11   SpO2: 93% 96% 96% 94%    Physical Exam  Constitutional: Appears calm, NAD HENT: Normocephalic. External right and left ear normal. Dry MM  Eyes: Conjunctivae and EOM are normal. PERRLA, no scleral icterus.  Neck: Normal ROM. Neck supple. No JVD. No tracheal deviation. No thyromegaly.  CVS: RRR, S1/S2 +, no murmurs, no gallops, no carotid bruit.  Pulmonary: Effort and breath sounds normal, no stridor, rhonchi, wheezes, rales.  Abdominal: Soft. BS +,  no distension, tenderness, rebound or guarding.  Musculoskeletal: Normal range of motion. Lymphadenopathy: No lymphadenopathy noted, cervical, inguinal. Neuro: Normal reflexes, muscle tone coordination. No cranial nerve deficit. Skin: Skin is warm and dry. No rash noted. Not diaphoretic. No erythema. No pallor.  Psychiatric: Difficult to assess due to AMS.   Labs on Admission:  Basic  Metabolic Panel:  Recent Labs Lab 04/24/14 1859 04/26/14 1405  NA 143 141  K 4.2 3.6*  CL 107 102  CO2 25 20  GLUCOSE 116* 148*  BUN 14 11  CREATININE 1.05 1.34  CALCIUM 9.3 9.7   Liver Function Tests: No results found for this basename: AST, ALT, ALKPHOS, BILITOT, PROT, ALBUMIN,  in the last 168 hours No results found for this basename: LIPASE, AMYLASE,  in the last 168 hours No results found for this basename: AMMONIA,  in the last 168 hours CBC:  Recent Labs Lab 04/24/14 1859 04/26/14 1405  WBC 6.9 5.3  NEUTROABS 5.0 3.8  HGB 12.4* 13.9  HCT 36.5* 39.3  MCV 89.2 90.3  PLT 181 192   Cardiac Enzymes:  Recent Labs Lab 04/26/14 1405  CKTOTAL 96  TROPONINI <0.30    EKG: Normal sinus rhythm, no ST/T wave changes  MAGICK-Austin Herd, Rebecca Eaton, MD  [facility] Pager [phone]  If 7PM-7AM, please contact night-coverage [email or web address] Password [facility] 04/26/2014, 5:58 PM

## 2014-04-26 NOTE — Progress Notes (Signed)
Utilization Review completed.  Veronda Gabor RN CM  
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
## 2014-04-26 NOTE — ED Notes (Signed)
Bed: XY80 Expected date: 04/26/14 Expected time: 1:26 PM Means of arrival: Ambulance Comments: EMS elderly/stumbling

## 2014-04-26 NOTE — Progress Notes (Signed)
Placed on CIWA protocol.   Faye Ramsay, MD  [facility] Pager [phone] Cell [phone]  If 7PM-7AM, please contact night-coverage [email or web address] Password [email or web address]

## 2014-04-26 NOTE — ED Provider Notes (Signed)
CSN: [id number]     Arrival date & time 04/26/14  1341 History   First MD Initiated Contact with Patient 04/26/14 1356     Chief Complaint  Patient presents with  . Fall  . Hypotension     (Consider location/radiation/quality/duration/timing/severity/associated sxs/prior Treatment) HPI  63yM brought in by EMS after falls. Bystanders called after witnessed stumbling. Did have at least one witnessed fall. Pt telling me he fell three times today. Seen in ED a couple days ago after a fall as well. He cannot tell me how/why he fell though. Says he hit his head "against a bush." Denies LOC. Pt awake and follows commands but seems somewhat confused/unreliable. Psych history. Per review of notes, I get the sense that this may be at or near his baseline. Lives in a [place]. Smokes. PMHx of abusing various substances but denies recently. Only pain complaint is R anterior chest and knees. Says hurt his chest began hurting after one of his falls.   Past Medical History  Diagnosis Date  . Hypertension   . Psychiatric disorder   . Hepatitis C   . Seizures   . Schizophrenia   . Bipolar affective   . Depression   . Schizoaffective disorder    History reviewed. No pertinent past surgical history. History reviewed. No pertinent family history. History  Substance Use Topics  . Smoking status: Current Every Day Smoker -- 1.00 packs/day for 3 years    Types: Cigarettes  . Smokeless tobacco: Never Used  . Alcohol Use: No    Review of Systems  All systems reviewed and negative, other than as noted in HPI.   Allergies  Review of patient's allergies indicates no known allergies.  Home Medications   Prior to Admission medications   Medication Sig Start Date End Date Taking? Authorizing Provider  benztropine (COGENTIN) 0.5 MG tablet Take 0.5 mg by mouth daily.    Yes Historical Provider, MD  colchicine 0.6 MG tablet Take 0.6 mg by mouth daily.   Yes Historical Provider, MD  lithium  carbonate (LITHOBID) 300 MG CR tablet Take 300 mg by mouth 2 (two) times daily.   Yes Historical Provider, MD  traZODone (DESYREL) 50 MG tablet Take 50 mg by mouth at bedtime.   Yes Historical Provider, MD   BP 102/84  Pulse 73  Temp(Src) 99.7 F (37.6 C) (Rectal)  Resp 23  SpO2 96% Physical Exam  Nursing note and vitals reviewed. Constitutional: He appears well-developed and well-nourished.  HENT:  Head: Normocephalic and atraumatic.  Edentulous. Dry mucus membranes.   Eyes: Conjunctivae are normal. Right eye exhibits no discharge. Left eye exhibits no discharge.  Neck: Neck supple.  Cardiovascular: Normal rate, regular rhythm and normal heart sounds.  Exam reveals no gallop and no friction rub.   No murmur heard. Pulmonary/Chest: Effort normal and breath sounds normal. No respiratory distress. He exhibits tenderness.  Tenderness to palpation R chest wall. No crepitus.  Abdominal: Soft. He exhibits no distension. There is no tenderness.  Musculoskeletal: He exhibits no edema and no tenderness.  Neurological: He is alert.  Oriented to place. Could not tell me day of week, month or year. Follows simple commands. No focal motor deficit. CN intact. Muscle tone normal. No inducible clonus.   Skin: Skin is warm and dry. He is not diaphoretic.  Abrasions to b/l hands and knees. No bony tenderness of extremities or apparent pain with ROM of large joints. No midline spinal tenderness.   Psychiatric: He has a  normal mood and affect. His behavior is normal. Thought content normal.    ED Course  Procedures (including critical care time) Labs Review Labs Reviewed  BASIC METABOLIC PANEL - Abnormal; Notable for the following:    Potassium 3.6 (*)    Glucose, Bld 148 (*)    GFR calc non Af Amer 55 (*)    GFR calc Af Amer 64 (*)    Anion gap 19 (*)    All other components within normal limits  URINALYSIS, ROUTINE W REFLEX MICROSCOPIC - Abnormal; Notable for the following:    APPearance  CLOUDY (*)    Protein, ur 100 (*)    All other components within normal limits  LITHIUM LEVEL - Abnormal; Notable for the following:    Lithium Lvl 0.60 (*)    All other components within normal limits  I-STAT CG4 LACTIC ACID, ED - Abnormal; Notable for the following:    Lactic Acid, Venous 4.99 (*)    All other components within normal limits  CULTURE, BLOOD (ROUTINE X 2)  CULTURE, BLOOD (ROUTINE X 2)  CK  CBC WITH DIFFERENTIAL  TROPONIN I  URINE MICROSCOPIC-ADD ON  URINE RAPID DRUG SCREEN (HOSP PERFORMED)    Imaging Review Dg Chest 2 View  04/26/2014   CLINICAL DATA:  Fever.  Syncope.  EXAM: CHEST  2 VIEW  COMPARISON:  11/28/2012  FINDINGS: Lateral view degraded by patient arm position. Midline trachea. Normal heart size and mediastinal contours. No pleural effusion or pneumothorax. Scattered areas of scarring, including within the left upper and both lower lobes.  IMPRESSION: No acute cardiopulmonary disease.   Electronically Signed   By: Abigail Miyamoto M.D.   On: 04/26/2014 14:39   Ct Head Wo Contrast  04/26/2014   CLINICAL DATA:  Altered mental status, gait disturbance, fell  EXAM: CT HEAD WITHOUT CONTRAST  TECHNIQUE: Contiguous axial images were obtained from the base of the skull through the vertex without intravenous contrast.  COMPARISON:  04/24/2014  FINDINGS: No evidence of infarct, hemorrhage, or extra-axial fluid. Stable moderate diffuse atrophy and mild deep white matter low attenuation. No hydrocephalus. Calvarium intact.  IMPRESSION: No acute findings.   Electronically Signed   By: Skipper Cliche M.D.   On: 04/26/2014 14:37   Ct Head Wo Contrast  04/24/2014   CLINICAL DATA:  Fall.  EXAM: CT HEAD WITHOUT CONTRAST  TECHNIQUE: Contiguous axial images were obtained from the base of the skull through the vertex without intravenous contrast.  COMPARISON:  05/31/2013  FINDINGS: No acute cortical infarct, hemorrhage, or mass lesion ispresent. Ventricles are of normal size. No  significant extra-axial fluid collection is present. The paranasal sinuses andmastoid air cells are clear. The osseous skull is intact.  IMPRESSION: Normal brain.   Electronically Signed   By: Kerby Moors M.D.   On: 04/24/2014 18:45   Dg Knee Complete 4 Views Left  04/24/2014   CLINICAL DATA:  Fall.  EXAM: LEFT KNEE - COMPLETE 4+ VIEW  COMPARISON:  None.  FINDINGS: There is no evidence of fracture, dislocation, or joint effusion. There is mild degenerative change and sharpening of the tibial spines. No joint effusion noted.  IMPRESSION: 1. No acute findings. 2. Mild degenerative change.   Electronically Signed   By: Kerby Moors M.D.   On: 04/24/2014 19:37     EKG Interpretation   Date/Time:  Monday April 26 2014 14:16:55 EDT Ventricular Rate:  84 PR Interval:  192 QRS Duration: 92 QT Interval:  473 QTC Calculation: 559  R Axis:   37 Text Interpretation:  Sinus rhythm Low voltage, precordial leads Prolonged  QT interval Confirmed by Wilson Singer  MD, Dannon Perlow ([id number]) on 04/26/2014 3:50:01 PM      MDM   Final diagnoses:  Hypotension, unspecified hypotension type  Syncope and collapse  Elevated lactic acid level  Prolonged Q-T interval on ECG    63yM with recurrent falls. Unclear if syncope or mechanical. Pt not reliable historian. Hypotensive on arrival. Oral temp was 100.1. Lactic acid 4.99. Empiric abx for sepsis ordered. BP has responded to IVF. No clear source if infectious. UA ok. CXR clear. Blood cultures ordered.     Virgel Manifold, MD 04/26/14 [phone]

## 2014-04-27 DIAGNOSIS — R6510 Systemic inflammatory response syndrome (SIRS) of non-infectious origin without acute organ dysfunction: Secondary | ICD-10-CM | POA: Diagnosis present

## 2014-04-27 DIAGNOSIS — F3130 Bipolar disorder, current episode depressed, mild or moderate severity, unspecified: Secondary | ICD-10-CM

## 2014-04-27 DIAGNOSIS — S0990XA Unspecified injury of head, initial encounter: Secondary | ICD-10-CM

## 2014-04-27 LAB — COMPREHENSIVE METABOLIC PANEL
ALBUMIN: 3.1 g/dL — AB (ref 3.5–5.2)
ALT: 19 U/L (ref 0–53)
ANION GAP: 10 (ref 5–15)
AST: 21 U/L (ref 0–37)
Alkaline Phosphatase: 78 U/L (ref 39–117)
BUN: 8 mg/dL (ref 6–23)
CHLORIDE: 109 meq/L (ref 96–112)
CO2: 23 mEq/L (ref 19–32)
Calcium: 8.8 mg/dL (ref 8.4–10.5)
Creatinine, Ser: 1.03 mg/dL (ref 0.50–1.35)
GFR calc Af Amer: 87 mL/min — ABNORMAL LOW (ref >90)
GFR calc non Af Amer: 75 mL/min — ABNORMAL LOW (ref >90)
Glucose, Bld: 113 mg/dL — ABNORMAL HIGH (ref 70–99)
Potassium: 3.5 mEq/L — ABNORMAL LOW (ref 3.7–5.3)
Sodium: 142 mEq/L (ref 137–147)
Total Bilirubin: 1.1 mg/dL (ref 0.3–1.2)
Total Protein: 6.3 g/dL (ref 6.0–8.3)

## 2014-04-27 LAB — CBC
HEMATOCRIT: 34.8 % — AB (ref 39.0–52.0)
Hemoglobin: 12.2 g/dL — ABNORMAL LOW (ref 13.0–17.0)
MCH: 31.2 pg (ref 26.0–34.0)
MCHC: 35.1 g/dL (ref 30.0–36.0)
MCV: 89 fL (ref 78.0–100.0)
Platelets: 163 10*3/uL (ref 150–400)
RBC: 3.91 MIL/uL — AB (ref 4.22–5.81)
RDW: 12.7 % (ref 11.5–15.5)
WBC: 6.3 10*3/uL (ref 4.0–10.5)

## 2014-04-27 LAB — URINALYSIS, ROUTINE W REFLEX MICROSCOPIC
Bilirubin Urine: NEGATIVE
Glucose, UA: NEGATIVE mg/dL
Hgb urine dipstick: NEGATIVE
KETONES UR: NEGATIVE mg/dL
LEUKOCYTES UA: NEGATIVE
NITRITE: NEGATIVE
PH: 7 (ref 5.0–8.0)
PROTEIN: NEGATIVE mg/dL
Specific Gravity, Urine: 1.008 (ref 1.005–1.030)
Urobilinogen, UA: 0.2 mg/dL (ref 0.0–1.0)

## 2014-04-27 LAB — HEMOGLOBIN A1C
Hgb A1c MFr Bld: 5.2 % (ref <5.7)
MEAN PLASMA GLUCOSE: 103 mg/dL (ref <117)

## 2014-04-27 MED ORDER — VANCOMYCIN HCL IN DEXTROSE 1-5 GM/200ML-% IV SOLN
1000.0000 mg | Freq: Two times a day (BID) | INTRAVENOUS | Status: DC
Start: 2014-04-27 — End: 2014-04-29
  Administered 2014-04-27 – 2014-04-29 (×5): 1000 mg via INTRAVENOUS
  Filled 2014-04-27 (×6): qty 200

## 2014-04-27 MED ORDER — BENZTROPINE MESYLATE 0.5 MG PO TABS
0.5000 mg | ORAL_TABLET | Freq: Every day | ORAL | Status: DC
  Administered 2014-04-27 – 2014-04-29 (×3): 0.5 mg via ORAL
  Filled 2014-04-27 (×3): qty 1

## 2014-04-27 MED ORDER — TRAZODONE HCL 50 MG PO TABS
50.0000 mg | ORAL_TABLET | Freq: Every day | ORAL | Status: DC
  Administered 2014-04-27 – 2014-04-28 (×2): 50 mg via ORAL
  Filled 2014-04-27 (×3): qty 1

## 2014-04-27 NOTE — Progress Notes (Addendum)
Clinical Social Work Department BRIEF PSYCHOSOCIAL ASSESSMENT 04/27/2014  Patient:  Daryl Campbell,Daryl Campbell     Account Number:  [id number]     Admit date:  04/26/2014  Clinical Social Worker:  Ulyess Blossom  Date/Time:  04/27/2014 12:03 PM  Referred by:  Physician  Date Referred:  04/27/2014 Referred for  Other - See comment   Other Referral:   Admitted from [place] type:  Patient Other interview type:    PSYCHOSOCIAL DATA Living Status:  FACILITY Admitted from facility:   Level of care:   Primary support name:  Daryl Campbell/guardian/([phone] Primary support relationship to patient:  NONE Degree of support available:   message left with DSS guardian    CURRENT CONCERNS Current Concerns  Post-Acute Placement   Other Concerns:   Pt admitted from [place] / [facility] CSW received referral that pt admitted from group home.    CSW reviewed chart and noted that pt admitted from [facility].    CSW met with pt at bedside. CSW introduced self and explained role. Pt confirmed that he is a resident at [facility]. Pt stated that he has been a resident there for three years. Pt reports support from pt siblings. Pt stated that he plans to return to [facility] when he is stable for discharge. Pt agreeable to CSW contacting pt brother to discuss. Pt stated that he is unsure if he has a guardian to make his decisions.    CSW contacted [facility] [phone]) and left message. CSW located alternative phone number for Daryl Campbell [phone]) and contacted and left message. CSW noted that a hospital CSW had communicated with Daryl Campbell from [place] [phone]). This CSW contacted Daryl Campbell and left message as well.    CSW contacted pt brother, Daryl Campbell via telephone and left message with Daryl Campbell's son for pt brother to contact this CSW. Per pt brother's son, pt brother is currently at work and will  likely not be able to call back until tomorrow.    CSW reviewed past documentation for pt which stated that pt has a guardian through [facility], Daryl Campbell [phone]) and left voice message.    CSW to await return phone call from [facility] and [place] guardian to assist with pt disposition needs.    CSW to continue to follow.   Assessment/plan status:  Psychosocial Support/Ongoing Assessment of Needs Other assessment/ plan:   discharge planning   Information/referral to community resources:   Referral back to [place]    PATIENT'S/FAMILY'S RESPONSE TO PLAN OF CARE: Pt alert and oriented x 3. Pt pleasant and talkative during assessment. Pt states that his parents have passed away, but that his siblings are supportive of him. Pt eager to return to [facility] and hopes to go back tomorrow. CSW has left message with pt guardian, Daryl Campbell and left message with [facility].   Daryl Campbell, MSW, [place] Work [phone]
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
## 2014-04-27 NOTE — Consult Note (Signed)
[facility] Consult   Reason for Consult:  AMS Bipolar disorder and medication management Referring Physician:  Dr. Manson Allan is an 63 y.o. male. Total Time spent with patient: 45 minutes  Assessment: AXIS I:  Bipolar, Depressed AXIS II:  Deferred AXIS III:   Past Medical History  Diagnosis Date  . Hypertension   . Psychiatric disorder   . Hepatitis C   . Seizures   . Schizophrenia   . Bipolar affective   . Depression   . Schizoaffective disorder    AXIS IV:  other psychosocial or environmental problems, problems related to social environment and problems with primary support group AXIS V:  51-60 moderate symptoms  Plan:  Referred to the psychiatric social services regarding collateral information from the group home No evidence of imminent risk to self or others at present.   Patient does not meet criteria for psychiatric inpatient admission. Supportive therapy provided about ongoing stressors. Discussed crisis plan, support from social network, calling 911, coming to the Emergency Department, and calling Suicide Hotline. Restart home medication and monitor for therapeutic window of Lithium levels in 3 days. May be discharged to outpatient psychiatric services  at [facility] when medically stable has patient has no safety concerns  Subjective:   Daryl Campbell is a 63 y.o. male patient admitted with AMS.  HPI: Patient is seen, chart reviewed and the case be discussed with Dr. Wendee Beavers. Patient is calm, cooperative and pleasant during this evaluation. Patient is awake, alert and oriented to his surroundings. Patient needed prompting to remember his medication names. Patient stated he has been compliant with his medication and has no reported adverse effects. Patient has been suffering with depression, anxiety and has been diagnosed with the bipolar disorder. Patient denied current symptoms of suicidal, homicidal ideation, intention or plans. Patient has no  evidence of psychotic symptoms. Patient reportedly has a poor memory and sometimes he cannot remember the names of the people. Patient reported that he is a resident of [facility] and has been receiving outpatient psychiatric services from [facility] in downtown [place]. Patient stated he does not have contact with his family members. Patient has no recent acute psychiatric hospitalization and he was hospitalized in [place] in 2003. Patient is willing to take this medication as prescribed and follow up with outpatient psychiatric services upon medically discharged.  Medical history: 63 yo male with history of multiple psychiatric illness including depression, bipolar disorder, PSA including tobacco and unclear what other substances, presented to [facility] ED via EMS after found by bystander who witness pt stumbling and falling down. Pt was alert enough at that time to report fall several times today prior to this admission and said his head was hurting. He has denies chest pain or shortness of breath, abd or urinary concerns. In ED, pt noted to be confused, vitals notable for SBP in 60's, Temp 100.2F, HR in 120's. TRH asked to admit to SDU for further evaluation of presumptive sepsis of an unclear etiology  HPI Elements:   Location:  bipolar . Quality:  acute. Severity:  acute and chronic. Timing:  unknown psychosocial stresses.  Past Psychiatric History: Past Medical History  Diagnosis Date  . Hypertension   . Psychiatric disorder   . Hepatitis C   . Seizures   . Schizophrenia   . Bipolar affective   . Depression   . Schizoaffective disorder     reports that he has been smoking Cigarettes.  He has a  3 pack-year smoking history. He has never used smokeless tobacco. He reports that he uses illicit drugs (Marijuana). He reports that he does not drink alcohol. History reviewed. No pertinent family history.       Abuse/Neglect [facility]) Physical Abuse: Denies Verbal Abuse:  Denies Sexual Abuse: Denies Allergies:  No Known Allergies  ACT Assessment Complete:   Objective: Blood pressure 121/99, pulse 54, temperature 97.5 F (36.4 C), temperature source Oral, resp. rate 20, height 5' 8"  (1.727 m), weight 80.9 kg (178 lb 5.6 oz), SpO2 98.00%.Body mass index is 27.12 kg/(m^2). Results for orders placed during the hospital encounter of 04/26/14 (from the past 72 hour(s))  CK     Status: None   Collection Time    04/26/14  2:05 PM      Result Value Ref Range   Total CK 96  7 - 232 U/L  CBC WITH DIFFERENTIAL     Status: None   Collection Time    04/26/14  2:05 PM      Result Value Ref Range   WBC 5.3  4.0 - 10.5 K/uL   RBC 4.35  4.22 - 5.81 MIL/uL   Hemoglobin 13.9  13.0 - 17.0 g/dL   HCT 39.3  39.0 - 52.0 %   MCV 90.3  78.0 - 100.0 fL   MCH 32.0  26.0 - 34.0 pg   MCHC 35.4  30.0 - 36.0 g/dL   RDW 12.8  11.5 - 15.5 %   Platelets 192  150 - 400 K/uL   Neutrophils Relative % 73  43 - 77 %   Neutro Abs 3.8  1.7 - 7.7 K/uL   Lymphocytes Relative 18  12 - 46 %   Lymphs Abs 1.0  0.7 - 4.0 K/uL   Monocytes Relative 7  3 - 12 %   Monocytes Absolute 0.4  0.1 - 1.0 K/uL   Eosinophils Relative 2  0 - 5 %   Eosinophils Absolute 0.1  0.0 - 0.7 K/uL   Basophils Relative 0  0 - 1 %   Basophils Absolute 0.0  0.0 - 0.1 K/uL  BASIC METABOLIC PANEL     Status: Abnormal   Collection Time    04/26/14  2:05 PM      Result Value Ref Range   Sodium 141  137 - 147 mEq/L   Potassium 3.6 (*) 3.7 - 5.3 mEq/L   Chloride 102  96 - 112 mEq/L   CO2 20  19 - 32 mEq/L   Glucose, Bld 148 (*) 70 - 99 mg/dL   BUN 11  6 - 23 mg/dL   Creatinine, Ser 1.34  0.50 - 1.35 mg/dL   Calcium 9.7  8.4 - 10.5 mg/dL   GFR calc non Af Amer 55 (*) >90 mL/min   GFR calc Af Amer 64 (*) >90 mL/min   Comment: (NOTE)     The eGFR has been calculated using the CKD EPI equation.     This calculation has not been validated in all clinical situations.     eGFR's persistently <90 mL/min signify  possible Chronic Kidney     Disease.   Anion gap 19 (*) 5 - 15  TROPONIN I     Status: None   Collection Time    04/26/14  2:05 PM      Result Value Ref Range   Troponin I <0.30  <0.30 ng/mL   Comment:            Due to the  release kinetics of cTnI,     a negative result within the first hours     of the onset of symptoms does not rule out     myocardial infarction with certainty.     If myocardial infarction is still suspected,     repeat the test at appropriate intervals.  I-STAT CG4 LACTIC ACID, ED     Status: Abnormal   Collection Time    04/26/14  2:26 PM      Result Value Ref Range   Lactic Acid, Venous 4.99 (*) 0.5 - 2.2 mmol/L  CULTURE, BLOOD (ROUTINE X 2)     Status: None   Collection Time    04/26/14  2:59 PM      Result Value Ref Range   Specimen Description BLOOD RIGHT ANTECUBITAL     Special Requests BOTTLES DRAWN AEROBIC AND ANAEROBIC 4ML     Culture  Setup Time       Value: 04/26/2014 20:06     Performed at [facility]   Culture       Value:        BLOOD CULTURE RECEIVED NO GROWTH TO DATE CULTURE WILL BE HELD FOR 5 DAYS BEFORE ISSUING A FINAL NEGATIVE REPORT     Performed at [facility]   Report Status PENDING    CULTURE, BLOOD (ROUTINE X 2)     Status: None   Collection Time    04/26/14  2:59 PM      Result Value Ref Range   Specimen Description BLOOD LEFT ANTECUBITAL     Special Requests BOTTLES DRAWN AEROBIC AND ANAEROBIC 4ML     Culture  Setup Time       Value: 04/26/2014 20:05     Performed at [facility]   Culture       Value:        BLOOD CULTURE RECEIVED NO GROWTH TO DATE CULTURE WILL BE HELD FOR 5 DAYS BEFORE ISSUING A FINAL NEGATIVE REPORT     Performed at [facility]   Report Status PENDING    LITHIUM LEVEL     Status: Abnormal   Collection Time    04/26/14  2:59 PM      Result Value Ref Range   Lithium Lvl 0.60 (*) 0.80 - 1.40 mEq/L  URINALYSIS, ROUTINE W REFLEX MICROSCOPIC     Status: Abnormal    Collection Time    04/26/14  3:15 PM      Result Value Ref Range   Color, Urine YELLOW  YELLOW   APPearance CLOUDY (*) CLEAR   Specific Gravity, Urine 1.015  1.005 - 1.030   pH 6.5  5.0 - 8.0   Glucose, UA NEGATIVE  NEGATIVE mg/dL   Hgb urine dipstick NEGATIVE  NEGATIVE   Bilirubin Urine NEGATIVE  NEGATIVE   Ketones, ur NEGATIVE  NEGATIVE mg/dL   Protein, ur 100 (*) NEGATIVE mg/dL   Urobilinogen, UA 1.0  0.0 - 1.0 mg/dL   Nitrite NEGATIVE  NEGATIVE   Leukocytes, UA NEGATIVE  NEGATIVE  URINE MICROSCOPIC-ADD ON     Status: None   Collection Time    04/26/14  3:15 PM      Result Value Ref Range   WBC, UA 0-2  <3 WBC/hpf  URINE RAPID DRUG SCREEN (HOSP PERFORMED)     Status: None   Collection Time    04/26/14  4:06 PM      Result Value Ref Range   Opiates NONE DETECTED  NONE DETECTED   Cocaine NONE DETECTED  NONE DETECTED   Benzodiazepines NONE DETECTED  NONE DETECTED   Amphetamines NONE DETECTED  NONE DETECTED   Tetrahydrocannabinol NONE DETECTED  NONE DETECTED   Barbiturates NONE DETECTED  NONE DETECTED   Comment:            DRUG SCREEN FOR MEDICAL PURPOSES     ONLY.  IF CONFIRMATION IS NEEDED     FOR ANY PURPOSE, NOTIFY LAB     WITHIN 5 DAYS.                LOWEST DETECTABLE LIMITS     FOR URINE DRUG SCREEN     Drug Class       Cutoff (ng/mL)     Amphetamine      1000     Barbiturate      200     Benzodiazepine   409     Tricyclics       811     Opiates          300     Cocaine          300     THC              50  MRSA PCR SCREENING     Status: None   Collection Time    04/26/14  6:04 PM      Result Value Ref Range   MRSA by PCR NEGATIVE  NEGATIVE   Comment:            The GeneXpert MRSA Assay (FDA     approved for NASAL specimens     only), is one component of a     comprehensive MRSA colonization     surveillance program. It is not     intended to diagnose MRSA     infection nor to guide or     monitor treatment for     MRSA infections.  HEMOGLOBIN A1C      Status: None   Collection Time    04/26/14  6:43 PM      Result Value Ref Range   Hemoglobin A1C 5.2  <5.7 %   Comment: (NOTE)                                                                               According to the ADA Clinical Practice Recommendations for 2011, when     HbA1c is used as a screening test:      >=6.5%   Diagnostic of Diabetes Mellitus               (if abnormal result is confirmed)     5.7-6.4%   Increased risk of developing Diabetes Mellitus     References:Diagnosis and Classification of Diabetes Mellitus,Diabetes     BJYN,8295,62(ZHYQM 1):S62-S69 and Standards of Medical Care in             Diabetes - 2011,Diabetes Care,2011,34 (Suppl 1):S11-S61.   Mean Plasma Glucose 103  <117 mg/dL   Comment: Performed at [facility]  TSH     Status: None   Collection Time    04/26/14  6:44 PM  Result Value Ref Range   TSH 2.070  0.350 - 4.500 uIU/mL   Comment: Performed at [facility] ACID, PLASMA     Status: None   Collection Time    04/26/14  7:59 PM      Result Value Ref Range   Lactic Acid, Venous 1.8  0.5 - 2.2 mmol/L  ETHANOL     Status: None   Collection Time    04/26/14  7:59 PM      Result Value Ref Range   Alcohol, Ethyl (B) <11  0 - 11 mg/dL   Comment:            LOWEST DETECTABLE LIMIT FOR     SERUM ALCOHOL IS 11 mg/dL     FOR MEDICAL PURPOSES ONLY  CK TOTAL AND CKMB     Status: None   Collection Time    04/26/14  7:59 PM      Result Value Ref Range   Total CK 144  7 - 232 U/L   CK, MB 3.4  0.3 - 4.0 ng/mL   Relative Index 2.4  0.0 - 2.5   Comment: Performed at [facility]  CBC     Status: Abnormal   Collection Time    04/27/14  3:40 AM      Result Value Ref Range   WBC 6.3  4.0 - 10.5 K/uL   RBC 3.91 (*) 4.22 - 5.81 MIL/uL   Hemoglobin 12.2 (*) 13.0 - 17.0 g/dL   HCT 34.8 (*) 39.0 - 52.0 %   MCV 89.0  78.0 - 100.0 fL   MCH 31.2  26.0 - 34.0 pg   MCHC 35.1  30.0 - 36.0 g/dL   RDW 12.7  11.5 - 15.5 %    Platelets 163  150 - 400 K/uL  COMPREHENSIVE METABOLIC PANEL     Status: Abnormal   Collection Time    04/27/14  3:40 AM      Result Value Ref Range   Sodium 142  137 - 147 mEq/L   Potassium 3.5 (*) 3.7 - 5.3 mEq/L   Chloride 109  96 - 112 mEq/L   CO2 23  19 - 32 mEq/L   Glucose, Bld 113 (*) 70 - 99 mg/dL   BUN 8  6 - 23 mg/dL   Creatinine, Ser 1.03  0.50 - 1.35 mg/dL   Calcium 8.8  8.4 - 10.5 mg/dL   Total Protein 6.3  6.0 - 8.3 g/dL   Albumin 3.1 (*) 3.5 - 5.2 g/dL   AST 21  0 - 37 U/L   ALT 19  0 - 53 U/L   Alkaline Phosphatase 78  39 - 117 U/L   Total Bilirubin 1.1  0.3 - 1.2 mg/dL   GFR calc non Af Amer 75 (*) >90 mL/min   GFR calc Af Amer 87 (*) >90 mL/min   Comment: (NOTE)     The eGFR has been calculated using the CKD EPI equation.     This calculation has not been validated in all clinical situations.     eGFR's persistently <90 mL/min signify possible Chronic Kidney     Disease.   Anion gap 10  5 - 15  URINALYSIS, ROUTINE W REFLEX MICROSCOPIC     Status: None   Collection Time    04/27/14  8:32 AM      Result Value Ref Range   Color, Urine YELLOW  YELLOW   APPearance CLEAR  CLEAR  Specific Gravity, Urine 1.008  1.005 - 1.030   pH 7.0  5.0 - 8.0   Glucose, UA NEGATIVE  NEGATIVE mg/dL   Hgb urine dipstick NEGATIVE  NEGATIVE   Bilirubin Urine NEGATIVE  NEGATIVE   Ketones, ur NEGATIVE  NEGATIVE mg/dL   Protein, ur NEGATIVE  NEGATIVE mg/dL   Urobilinogen, UA 0.2  0.0 - 1.0 mg/dL   Nitrite NEGATIVE  NEGATIVE   Leukocytes, UA NEGATIVE  NEGATIVE   Comment: MICROSCOPIC NOT DONE ON URINES WITH NEGATIVE PROTEIN, BLOOD, LEUKOCYTES, NITRITE, OR GLUCOSE <1000 mg/dL.   Labs are reviewed.  Current Facility-Administered Medications  Medication Dose Route Frequency Provider Last Rate Last Dose  . 0.9 %  sodium chloride infusion   Intravenous Continuous Theodis Blaze, MD 100 mL/hr at 04/27/14 [phone]    . enoxaparin (LOVENOX) injection 40 mg  40 mg Subcutaneous Q24H Theodis Blaze, MD   40 mg at 04/26/14 2026  . lithium carbonate (LITHOBID) CR tablet 300 mg  300 mg Oral BID Theodis Blaze, MD   300 mg at 04/27/14 0949  . ondansetron (ZOFRAN) tablet 4 mg  4 mg Oral Q6H PRN Theodis Blaze, MD       Or  . ondansetron [facility]) injection 4 mg  4 mg Intravenous Q6H PRN Theodis Blaze, MD      . piperacillin-tazobactam (ZOSYN) IVPB 3.375 g  3.375 g Intravenous 3 times per day Minda Ditto, RPH   3.375 g at 04/27/14 1112  . vancomycin (VANCOCIN) IVPB 1000 mg/200 mL premix  1,000 mg Intravenous Q12H Emiliano Dyer, RPH   1,000 mg at 04/27/14 1112    Psychiatric Specialty Exam: Physical Exam as per history and  physical  Review of Systems  Psychiatric/Behavioral: Positive for depression. The patient is nervous/anxious and has insomnia.     Blood pressure 121/99, pulse 54, temperature 97.5 F (36.4 C), temperature source Oral, resp. rate 20, height 5' 8"  (1.727 m), weight 80.9 kg (178 lb 5.6 oz), SpO2 98.00%.Body mass index is 27.12 kg/(m^2).  General Appearance: Casual  Eye Contact::  Good  Speech:  Clear and Coherent and Slurred  Volume:  Decreased  Mood:  Anxious and Depressed  Affect:  Depressed and Flat  Thought Process:  Coherent and Goal Directed  Orientation:  Full (Time, Place, and Person)  Thought Content:  WDL  Suicidal Thoughts:  No  Homicidal Thoughts:  No  Memory:  Immediate;   Fair Recent;   Fair  Judgement:  Intact  Insight:  Good and Fair  Psychomotor Activity:  Decreased  Concentration:  Fair  Recall:  [employer] of Knowledge:Fair  Language: Fair  Akathisia:  NA  Handed:  Right  AIMS (if indicated):     Assets:  Communication Skills Desire for Improvement Financial Resources/Insurance Housing Leisure Time Resilience Social Support  Sleep:      Musculoskeletal: Strength & Muscle Tone: decreased Gait & Station: unsteady Patient leans: N/A  Treatment Plan Summary: Daily contact with patient to assess and evaluate symptoms and  progress in treatment Medication management Restart home medication lithium milligrams twice a day, benztropine 0.5 mg once a day and trazodone 50 mg at bedtime and monitor for the therapeutic levels and side effects Psychiatric consultation followup as clinically required  Dennard Vezina,JANARDHAHA R. 04/27/2014 11:31 AM

## 2014-04-27 NOTE — Progress Notes (Signed)
ANTIBIOTIC CONSULT NOTE - FOLLOW UP  Pharmacy Consult for Vancomycin and Zosyn Indication: rule out sepsis  No Known Allergies  Patient Measurements: Height: 5\' 8"  (172.7 cm) Weight: 178 lb 5.6 oz (80.9 kg) IBW/kg (Calculated) : 68.4  Vital Signs: Temp: 97.5 F (36.4 C) (08/25 0800) Temp src: Oral (08/25 0800) BP: 121/99 mmHg (08/25 0800) Pulse Rate: 54 (08/25 0800) Intake/Output from previous day: 08/24 0701 - 08/25 0700 In: 2555 [I.V.:2305; IV Piggyback:250] Out: 1100 [Urine:1100] Intake/Output from this shift: Total I/O In: 200 [I.V.:200] Out: 240 [Urine:240]  Labs:  Recent Labs  04/24/14 1859 04/26/14 1405 04/27/14 0340  WBC 6.9 5.3 6.3  HGB 12.4* 13.9 12.2*  PLT 181 192 163  CREATININE 1.05 1.34 1.03   Estimated Creatinine Clearance: 71 ml/min (by C-G formula based on Cr of 1.03).  74 ml/min/1.71m2 (normalized)  No results found for this basename: VANCOTROUGH, VANCOPEAK, VANCORANDOM, GENTTROUGH, GENTPEAK, GENTRANDOM, TOBRATROUGH, TOBRAPEAK, TOBRARND, AMIKACINPEAK, AMIKACINTROU, AMIKACIN,  in the last 72 hours   Microbiology: Recent Results (from the past 720 hour(s))  CULTURE, BLOOD (ROUTINE X 2)     Status: None   Collection Time    04/26/14  2:59 PM      Result Value Ref Range Status   Specimen Description BLOOD RIGHT ANTECUBITAL   Final   Special Requests BOTTLES DRAWN AEROBIC AND ANAEROBIC 4ML   Final   Culture  Setup Time     Final   Value: 04/26/2014 20:06     Performed at [facility]   Culture     Final   Value:        BLOOD CULTURE RECEIVED NO GROWTH TO DATE CULTURE WILL BE HELD FOR 5 DAYS BEFORE ISSUING A FINAL NEGATIVE REPORT     Performed at [facility]   Report Status PENDING   Incomplete  CULTURE, BLOOD (ROUTINE X 2)     Status: None   Collection Time    04/26/14  2:59 PM      Result Value Ref Range Status   Specimen Description BLOOD LEFT ANTECUBITAL   Final   Special Requests BOTTLES DRAWN AEROBIC AND ANAEROBIC 4ML    Final   Culture  Setup Time     Final   Value: 04/26/2014 20:05     Performed at [facility]   Culture     Final   Value:        BLOOD CULTURE RECEIVED NO GROWTH TO DATE CULTURE WILL BE HELD FOR 5 DAYS BEFORE ISSUING A FINAL NEGATIVE REPORT     Performed at [facility]   Report Status PENDING   Incomplete  MRSA PCR SCREENING     Status: None   Collection Time    04/26/14  6:04 PM      Result Value Ref Range Status   MRSA by PCR NEGATIVE  NEGATIVE Final   Comment:            The GeneXpert MRSA Assay (FDA     approved for NASAL specimens     only), is one component of a     comprehensive MRSA colonization     surveillance program. It is not     intended to diagnose MRSA     infection nor to guide or     monitor treatment for     MRSA infections.   Anti-infectives: 8/24 >> Vancomycin >> 8/24 >> Zosyn >>  Assessment: 6 yoM brought in by EMS: multiple falls, history of substance  abuse - UDS negative, Ethanol level ordered. Febrile, lactic acid elevated, hypotension. Pharmacy consulted to dose vancomycin and zosyn for rule out sepsis.  Tmax: 100.1  WBC: wnl Renal:SCr 1.03, CrCl 71CG, 74N Lactic Acid: 4.99 > 1.8  8/24 blood: ngtd 8/25 urine: sent after abx started  Drug level / dose changes info: 8/25 empirically increase vanc from 750mg  q12h to 1g q12h for improved SCr  Goal of Therapy:  Vancomycin trough level 15-20 mcg/ml Zosyn dose appropriate for indication, renal function  Plan:   Increase Vancomycin to 1g IV q12h and check trough at steady state  Continue Zosyn 3.375gm IV q8h (4hr extended infusions) Follow up renal function & cultures, clinical course  Peggyann Juba, PharmD, BCPS Pager: [phone] 04/27/2014,10:06 AM

## 2014-04-27 NOTE — Progress Notes (Addendum)
Nutrition Brief Note  Patient identified on the Malnutrition Screening Tool (MST) Report  Wt Readings from Last 15 Encounters:  04/27/14 178 lb 5.6 oz (80.9 kg)  03/23/14 185 lb (83.915 kg)  12/08/13 180 lb (81.647 kg)  07/28/13 182 lb (82.555 kg)  02/02/12 185 lb (83.915 kg)  09/28/11 180 lb (81.647 kg)  09/26/11 186 lb 9.6 oz (84.641 kg)  09/24/11 187 lb 6.3 oz (85 kg)    Body mass index is 27.12 kg/(m^2). Patient meets criteria for Overweight based on current BMI.   Current diet order is Heart Healthy, patient is consuming approximately 100% of meals at this time. Labs and medications reviewed.   Pt denied any changes in appetite pta, typically consumes two meals/day. Pt resident of [facility]. Reported they provide residents with eggs/grits for breakfast, and usually eats dinner in the community. Denied any unintentional wt loss, reported he feels as if he has gained weight around abd region. Encouraged compliance with heart healthy diet and general healthy diet.  No nutrition interventions warranted at this time. If nutrition issues arise, please consult RD.   Atlee Abide MS RD LDN Clinical Dietitian [phone]

## 2014-04-27 NOTE — Progress Notes (Signed)
CSW received return phone call from Montgomery Favor from [facility].  Daryl Campbell was pleased to know that pt is safe in the hospital Per Ms. Axelson, pt can return to [facility] when stable and for unit CSW to contact med tech with [facility] when pt medically ready for discharge and facility request updated FL2. CSW provided Ms. Bran with unit CSW, Raynaldo Opitz contact information and provided Ms. Boda with contact information to pt nursing unit.   CSW provided handoff to 4E CSW, Raynaldo Opitz who will continue to follow for pt disposition needs.   [name], MSW, [place] [facility] [phone]

## 2014-04-27 NOTE — Progress Notes (Signed)
[facility] PROGRESS NOTE  Daryl Campbell XBD:[id number] DOB: November 08, 1950 DOA: 04/26/2014 PCP: No PCP Per Patient  Assessment/Plan: 1. SIRs - Unclear source of infection as such meets SIRs criteria.  U/A negative and chest x ray negative for source - blood and urine cultures obtained - At this point favor continuing broad-spectrum antibiotic regimen - WBC within normal limits  2. history of schizophrenia/psychosis - Consulted psychiatry for further evaluation  3. Hypotension - Unclear whether this was from infectious source versus poor oral intake as patient looks disheveled  4. lactic acidosis - Again etiology questionable. Has resolved with IV fluid rehydration - Patient does have a history of alcohol use and was placed on Cipro protocol and certainly alcohol use can cause lactic acidosis - No source of infection identified yet  Code Status:  full Family Communication:  no family at bedside Disposition Plan:  pending continued improvement in condition   Consultants:   none  Procedures:   none  Antibiotics:   Zosyn and vancomycin   HPI/Subjective: Patient reports that the reason he was confused initially was because he fell and hit his head on the curb. But his story does not correlate with his physical exam findings. There is no bruising on his head. Which raises suspicion for confabulation versus an uncontrolled underlying psychiatric disorder.  Objective: Filed Vitals:   04/27/14 0800  BP: 121/99  Pulse: 54  Temp: 97.5 F (36.4 C)  Resp: 20    Intake/Output Summary (Last 24 hours) at 04/27/14 1015 Last data filed at 04/27/14 0800  Gross per 24 hour  Intake   2755 ml  Output   1340 ml  Net   1415 ml   Filed Weights   04/26/14 1700 04/27/14 0400  Weight: 79.7 kg (175 lb 11.3 oz) 80.9 kg (178 lb 5.6 oz)    Exam:   General:   Patient in no acute distress, alert and awake  Cardiovascular:  regular rate and rhythm, no murmurs or  rubs  Respiratory:  clear to auscultation bilaterally, no wheezes  Abdomen:  soft, nontender, nondistended  Musculoskeletal:  no cyanosis or clubbing   Data Reviewed: Basic Metabolic Panel:  Recent Labs Lab 04/24/14 1859 04/26/14 1405 04/27/14 0340  NA 143 141 142  K 4.2 3.6* 3.5*  CL 107 102 109  CO2 25 20 23   GLUCOSE 116* 148* 113*  BUN 14 11 8   CREATININE 1.05 1.34 1.03  CALCIUM 9.3 9.7 8.8   Liver Function Tests:  Recent Labs Lab 04/27/14 0340  AST 21  ALT 19  ALKPHOS 78  BILITOT 1.1  PROT 6.3  ALBUMIN 3.1*   No results found for this basename: LIPASE, AMYLASE,  in the last 168 hours No results found for this basename: AMMONIA,  in the last 168 hours CBC:  Recent Labs Lab 04/24/14 1859 04/26/14 1405 04/27/14 0340  WBC 6.9 5.3 6.3  NEUTROABS 5.0 3.8  --   HGB 12.4* 13.9 12.2*  HCT 36.5* 39.3 34.8*  MCV 89.2 90.3 89.0  PLT 181 192 163   Cardiac Enzymes:  Recent Labs Lab 04/26/14 1405 04/26/14 1959  CKTOTAL 96 144  CKMB  --  3.4  TROPONINI <0.30  --    BNP (last 3 results) No results found for this basename: PROBNP,  in the last 8760 hours CBG: No results found for this basename: GLUCAP,  in the last 168 hours  Recent Results (from the past 240 hour(s))  CULTURE, BLOOD (ROUTINE X 2)  Status: None   Collection Time    04/26/14  2:59 PM      Result Value Ref Range Status   Specimen Description BLOOD RIGHT ANTECUBITAL   Final   Special Requests BOTTLES DRAWN AEROBIC AND ANAEROBIC 4ML   Final   Culture  Setup Time     Final   Value: 04/26/2014 20:06     Performed at [facility]   Culture     Final   Value:        BLOOD CULTURE RECEIVED NO GROWTH TO DATE CULTURE WILL BE HELD FOR 5 DAYS BEFORE ISSUING A FINAL NEGATIVE REPORT     Performed at [facility]   Report Status PENDING   Incomplete  CULTURE, BLOOD (ROUTINE X 2)     Status: None   Collection Time    04/26/14  2:59 PM      Result Value Ref Range Status    Specimen Description BLOOD LEFT ANTECUBITAL   Final   Special Requests BOTTLES DRAWN AEROBIC AND ANAEROBIC 4ML   Final   Culture  Setup Time     Final   Value: 04/26/2014 20:05     Performed at [facility]   Culture     Final   Value:        BLOOD CULTURE RECEIVED NO GROWTH TO DATE CULTURE WILL BE HELD FOR 5 DAYS BEFORE ISSUING A FINAL NEGATIVE REPORT     Performed at [facility]   Report Status PENDING   Incomplete  MRSA PCR SCREENING     Status: None   Collection Time    04/26/14  6:04 PM      Result Value Ref Range Status   MRSA by PCR NEGATIVE  NEGATIVE Final   Comment:            The GeneXpert MRSA Assay (FDA     approved for NASAL specimens     only), is one component of a     comprehensive MRSA colonization     surveillance program. It is not     intended to diagnose MRSA     infection nor to guide or     monitor treatment for     MRSA infections.     Studies: Dg Chest 2 View  04/26/2014   CLINICAL DATA:  Fever.  Syncope.  EXAM: CHEST  2 VIEW  COMPARISON:  11/28/2012  FINDINGS: Lateral view degraded by patient arm position. Midline trachea. Normal heart size and mediastinal contours. No pleural effusion or pneumothorax. Scattered areas of scarring, including within the left upper and both lower lobes.  IMPRESSION: No acute cardiopulmonary disease.   Electronically Signed   By: Abigail Miyamoto M.D.   On: 04/26/2014 14:39   Ct Head Wo Contrast  04/26/2014   CLINICAL DATA:  Altered mental status, gait disturbance, fell  EXAM: CT HEAD WITHOUT CONTRAST  TECHNIQUE: Contiguous axial images were obtained from the base of the skull through the vertex without intravenous contrast.  COMPARISON:  04/24/2014  FINDINGS: No evidence of infarct, hemorrhage, or extra-axial fluid. Stable moderate diffuse atrophy and mild deep white matter low attenuation. No hydrocephalus. Calvarium intact.  IMPRESSION: No acute findings.   Electronically Signed   By: Skipper Cliche M.D.   On:  04/26/2014 14:37    Scheduled Meds: . enoxaparin (LOVENOX) injection  40 mg Subcutaneous Q24H  . lithium carbonate  300 mg Oral BID  . piperacillin-tazobactam (ZOSYN)  IV  3.375 g Intravenous  3 times per day  . vancomycin  1,000 mg Intravenous Q12H   Continuous Infusions: . sodium chloride 100 mL/hr at 04/27/14 1655     Time spent: > 35 minutes    Velvet Bathe  [facility] Pager [phone] If 7PM-7AM, please contact night-coverage at [email or web address], password [facility] 04/27/2014, 10:15 AM  LOS: 1 day

## 2014-04-27 NOTE — Progress Notes (Signed)
Report received from Virgina Norfolk, RN. No changes in assessment. Will continue to monitor pt and carry out POC. Carnella Gu[place] I

## 2014-04-28 DIAGNOSIS — F319 Bipolar disorder, unspecified: Secondary | ICD-10-CM

## 2014-04-28 DIAGNOSIS — E872 Acidosis, unspecified: Secondary | ICD-10-CM

## 2014-04-28 DIAGNOSIS — R6510 Systemic inflammatory response syndrome (SIRS) of non-infectious origin without acute organ dysfunction: Secondary | ICD-10-CM

## 2014-04-28 DIAGNOSIS — I959 Hypotension, unspecified: Secondary | ICD-10-CM

## 2014-04-28 LAB — URINE CULTURE
CULTURE: NO GROWTH
Colony Count: NO GROWTH

## 2014-04-28 MED ORDER — KETOROLAC TROMETHAMINE 30 MG/ML IJ SOLN
30.0000 mg | Freq: Once | INTRAMUSCULAR | Status: AC
Start: 2014-04-28 — End: 2014-04-28
  Administered 2014-04-28: 30 mg via INTRAVENOUS
  Filled 2014-04-28: qty 1

## 2014-04-28 MED ORDER — DIPHENHYDRAMINE HCL 50 MG/ML IJ SOLN
25.0000 mg | Freq: Once | INTRAMUSCULAR | Status: AC
  Administered 2014-04-28: 25 mg via INTRAVENOUS
  Filled 2014-04-28: qty 1

## 2014-04-28 MED ORDER — POTASSIUM CHLORIDE CRYS ER 20 MEQ PO TBCR
40.0000 meq | EXTENDED_RELEASE_TABLET | Freq: Once | ORAL | Status: AC
  Administered 2014-04-28: 40 meq via ORAL
  Filled 2014-04-28: qty 2

## 2014-04-28 MED ORDER — METOCLOPRAMIDE HCL 5 MG/ML IJ SOLN
10.0000 mg | Freq: Once | INTRAMUSCULAR | Status: AC
  Administered 2014-04-28: 10 mg via INTRAVENOUS
  Filled 2014-04-28: qty 2

## 2014-04-28 NOTE — Evaluation (Signed)
Physical Therapy Evaluation Patient Details Name: JHOVANNY GUINTA MRN: [id number] DOB: 07-21-1951 Today's Date: 04/28/2014   History of Present Illness  63 yo male admitted with sepsis, hypotension, AMS, fall. Hx of bipolar d/o, schizophrenia, Hep C, Sz. Pt is from group home.   Clinical Impression  On eval, pt required Min guard assist for mobility-able to ambulate ~300 feet without device. Unsteady at times during session but no overt LOB. Recommend daily mobility with nursing supervision during hospital stay and after d/c at group home.     Follow Up Recommendations No PT follow up;Supervision for mobility/OOB    Equipment Recommendations       Recommendations for Other Services       Precautions / Restrictions Precautions Precautions: Fall Restrictions Weight Bearing Restrictions: No      Mobility  Bed Mobility Overal bed mobility: Modified Independent                Transfers Overall transfer level: Modified independent                  Ambulation/Gait Ambulation/Gait assistance: Min guard Ambulation Distance (Feet): 300 Feet Assistive device: None Gait Pattern/deviations: Step-through pattern     General Gait Details: unsteady but no LOB during session.   Stairs            Wheelchair Mobility    Modified Rankin (Stroke Patients Only)       Balance Overall balance assessment: Needs assistance         Standing balance support: During functional activity;No upper extremity supported Standing balance-Leahy Scale: Good Standing balance comment: Had pt perform EO/EC standing, withstanding of external perturbations, narrow BOS standing, pick up object, 360 degree turns-close guard assist. Increased time to complete turns but no LOB with tasks.                              Pertinent Vitals/Pain Pain Assessment: No/denies pain    Home Living Family/patient expects to be discharged to:: Group home                       Prior Function Level of Independence: Independent               Hand Dominance        Extremity/Trunk Assessment   Upper Extremity Assessment: Overall WFL for tasks assessed           Lower Extremity Assessment: Overall WFL for tasks assessed      Cervical / Trunk Assessment: Normal  Communication   Communication: No difficulties  Cognition Arousal/Alertness: Awake/alert Behavior During Therapy: WFL for tasks assessed/performed Overall Cognitive Status: History of cognitive impairments - at baseline                      General Comments      Exercises        Assessment/Plan    PT Assessment Patient needs continued PT services  PT Diagnosis Difficulty walking   PT Problem List Decreased balance;Decreased mobility  PT Treatment Interventions Gait training;Balance training;Functional mobility training;Therapeutic activities;Therapeutic exercise   PT Goals (Current goals can be found in the Care Plan section) Acute Rehab PT Goals Patient Stated Goal: home soon PT Goal Formulation: With patient Time For Goal Achievement: 05/05/14 Potential to Achieve Goals: Good    Frequency Min 3X/week   Barriers to discharge  Co-evaluation               End of Session Equipment Utilized During Treatment: Gait belt Activity Tolerance: Patient tolerated treatment well Patient left: in bed;with call bell/phone within reach;with bed alarm set           Time: 5909-3112 PT Time Calculation (min): 13 min   Charges:   PT Evaluation $Initial PT Evaluation Tier I: 1 Procedure PT Treatments $Gait Training: 8-22 mins   PT G Codes:          Weston Anna, MPT Pager: [phone]

## 2014-04-28 NOTE — Progress Notes (Signed)
Clinical Social Work Department CLINICAL SOCIAL WORK PSYCHIATRY SERVICE LINE ASSESSMENT 04/28/2014  Patient:  Daryl Campbell  Account:  [id number]  [place] Date:  04/26/2014  Clinical Social Worker:  Sindy Messing, LCSW  Date/Time:  04/28/2014 03:00 PM Referred by:  Physician  Date referred:  04/28/2014 Reason for Referral  Psychosocial assessment   Presenting Symptoms/Problems (In the person's/family's own words):   Psych consulted due to medication management.   Abuse/Neglect/Trauma History (check all that apply)  Denies history   Abuse/Neglect/Trauma Comments:   Psychiatric History (check all that apply)  Outpatient treatment  Inpatient/hospitilization  Residential treatment   Psychiatric medications:  Cogentin 0.5 mg  Trazodone 50 mg   Current Mental Health Hospitalizations/Previous Mental Health History:   Patient reports he has been diagnosed with depression and schizophrenia several years ago. Patient states that group home manages medications and he goes to [place] to meet with a psychiatrist.   Current provider:   [name] and Date:   [place], [place]   Current Medications:   Scheduled Meds:      . benztropine  0.5 mg Oral Daily  . enoxaparin (LOVENOX) injection  40 mg Subcutaneous Q24H  . lithium carbonate  300 mg Oral BID  . piperacillin-tazobactam (ZOSYN)  IV  3.375 g Intravenous 3 times per day  . potassium chloride  40 mEq Oral Once  . traZODone  50 mg Oral QHS  . vancomycin  1,000 mg Intravenous Q12H        Continuous Infusions:      PRN Meds:.ondansetron (ZOFRAN) IV, ondansetron       Previous Impatient Admission/Date/Reason:   Patient had a stay at [facility] about 10 years ago.   Emotional Health / Current Symptoms    Suicide/Self Harm  Suicide attempt in past (date/description)   Suicide attempt in the past:   Patient attempted suicide by overdosing about 10 years ago. Patient denies any recent attempts and denies and SI or HI.   Other harmful  behavior:   None reported   Psychotic/Dissociative Symptoms  Auditory Hallucinations  Visual Hallucinations   Other Psychotic/Dissociative Symptoms:   Patient reports he takes a shot every month to help with AH and VH but he continues to endorse hallucinations. Patient reports he hears voices telling him that he is going to die but never commands him to hurt himself or others.    Attention/Behavioral Symptoms  Within Normal Limits   Other Attention / Behavioral Symptoms:   Patient engaged during assessment.    Cognitive Impairment  Within Normal Limits   Other Cognitive Impairment:   Patient alert and oriented.    Mood and Adjustment  Mood Congruent    Stress, Anxiety, Trauma, Any Recent Loss/Stressor  None reported   Anxiety (frequency):   N/A   Phobia (specify):   N/A   Compulsive behavior (specify):   N/A   Obsessive behavior (specify):   N/A   Other:   N/A   Substance Abuse/Use  History of substance use   SBIRT completed (please refer for detailed history):  N  Self-reported substance use:   Patient reports a history of alcohol use but denies any current use.   Urinary Drug Screen Completed:  Y Alcohol level:   <11    Environmental/Housing/Living Arrangement  Assisted Living / Group Home   Who is in the home:   [name]   Emergency contact:  [name]  Medicare  Medicaid   Patient's Strengths and Goals (  patient's own words):   Patient has stable housing and guardian.   Clinical Social Worker's Interpretive Summary:   CSW received referral in order to complete psychosocial assessment. CSW reviewed chart and met with patient at bedside. CSW introduced myself and explained role.    Patient reports that he moved into group home about 3 years ago. Patient reports he has never been married and does not have any children. Patient states that his siblings live in [place] and usually visit him once a month at  his group home. Patient reports he enjoys living at his group home but did wish that he was closer to his family at times.    Patient agreeable to discuss [place] concerns. Patient states that he is always compliant with medications and group home manages when he is supposed to take medications. Patient reports that he goes to [facility] when he needs his injections and to meet with his psychiatrist. Patient reports he feels medication is beneficial but that he still experiences hallucinations. Patient is aware of hallucinations and reports he is able to remind himself to ignore voices so that he can continue with his day.    Psych MD made medication recommendations and reports patient does not require inpatient treatment. CSW will continue to follow to assist as needed.   Disposition:  Recommend Psych CSW continuing to support while in hospital  [place], [place] [phone]

## 2014-04-28 NOTE — Progress Notes (Signed)
[facility] PROGRESS NOTE  Daryl Campbell:[id number] DOB: [birth year] DOA: 04/26/2014 PCP: No PCP Per Patient  Assessment/Plan  SIRs, unclear source of infection as such meets SIRs criteria. U/A negative and chest x ray negative for source, however, having increased cough - blood cx NGTD - Urine culture pending  - repeat CXR in AM - Continue broad-spectrum antibiotics until cultures negative x 48h.  If still negative tomorrow and no source of infection, okay to d/c  - WBC within normal limits   Schizophrenia/psychosis  - Appreciate psychiatry assistance -  Continue lithium, cogentin, trazodone  Hypotension in setting of hypertension, may have been due to infectious source versus dehydration and resolved -  Continue abx and IVF  lactic acidosis, likely due to dehydration and alcohol use and resolved  Sz d/o, seizure free  Weakness, deconditioning -  PT eval   Hypokalemia, likely iatrogenic -  Oral potassium repletion  Tobacco abuse  -  Nicotine patch  -  Counseled cessation  Diet:  Healthy heart Access:  PIV IVF:  Yes  Proph:  lovenox  Code Status: full Family Communication: patient alone Disposition Plan: likely d/c tomorrow pending blood and urine cultures and PT consult   Consultants:  none Procedures:  none Antibiotics:  Zosyn and vancomycin 8/24 >>    HPI/Subjective:  Increased cough, denies nausea, vomiting, diarrhea, lightheadedness, dysuria  Objective: Filed Vitals:   04/27/14 1600 04/27/14 2125 04/28/14 0519 04/28/14 1439  BP: 135/80 136/89 108/63 134/84  Pulse: 60 75 55 57  Temp:  98.3 F (36.8 C) 98.1 F (36.7 C) 98.5 F (36.9 C)  TempSrc:  Oral Oral Oral  Resp:  18 17 18   Height:      Weight:      SpO2:  97% 97% 98%    Intake/Output Summary (Last 24 hours) at 04/28/14 1440 Last data filed at 04/28/14 1022  Gross per 24 hour  Intake   1880 ml  Output   3150 ml  Net  -1270 ml   Filed Weights   04/26/14 1700 04/27/14 0400   Weight: 79.7 kg (175 lb 11.3 oz) 80.9 kg (178 lb 5.6 oz)    Exam:   General:  WM, No acute distress  HEENT:  NCAT, MMM  Cardiovascular:  RRR, nl S1, S2 no mrg, 2+ pulses, warm extremities  Respiratory:  CTAB, no increased WOB  Abdomen:   NABS, soft, NT/ND  MSK:   Normal tone and bulk, no LEE  Neuro:  Grossly intact  Skin:  Multiple abrasions on hands and knees  Data Reviewed: Basic Metabolic Panel:  Recent Labs Lab 04/24/14 1859 04/26/14 1405 04/27/14 0340  NA 143 141 142  K 4.2 3.6* 3.5*  CL 107 102 109  CO2 25 20 23   GLUCOSE 116* 148* 113*  BUN 14 11 8   CREATININE 1.05 1.34 1.03  CALCIUM 9.3 9.7 8.8   Liver Function Tests:  Recent Labs Lab 04/27/14 0340  AST 21  ALT 19  ALKPHOS 78  BILITOT 1.1  PROT 6.3  ALBUMIN 3.1*   No results found for this basename: LIPASE, AMYLASE,  in the last 168 hours No results found for this basename: AMMONIA,  in the last 168 hours CBC:  Recent Labs Lab 04/24/14 1859 04/26/14 1405 04/27/14 0340  WBC 6.9 5.3 6.3  NEUTROABS 5.0 3.8  --   HGB 12.4* 13.9 12.2*  HCT 36.5* 39.3 34.8*  MCV 89.2 90.3 89.0  PLT 181 192 163   Cardiac Enzymes:  Recent  Labs Lab 04/26/14 1405 04/26/14 1959  CKTOTAL 96 144  CKMB  --  3.4  TROPONINI <0.30  --    BNP (last 3 results) No results found for this basename: PROBNP,  in the last 8760 hours CBG: No results found for this basename: GLUCAP,  in the last 168 hours  Recent Results (from the past 240 hour(s))  CULTURE, BLOOD (ROUTINE X 2)     Status: None   Collection Time    04/26/14  2:59 PM      Result Value Ref Range Status   Specimen Description BLOOD RIGHT ANTECUBITAL   Final   Special Requests BOTTLES DRAWN AEROBIC AND ANAEROBIC 4ML   Final   Culture  Setup Time     Final   Value: 04/26/2014 20:06     Performed at [facility]   Culture     Final   Value:        BLOOD CULTURE RECEIVED NO GROWTH TO DATE CULTURE WILL BE HELD FOR 5 DAYS BEFORE ISSUING A  FINAL NEGATIVE REPORT     Performed at [facility]   Report Status PENDING   Incomplete  CULTURE, BLOOD (ROUTINE X 2)     Status: None   Collection Time    04/26/14  2:59 PM      Result Value Ref Range Status   Specimen Description BLOOD LEFT ANTECUBITAL   Final   Special Requests BOTTLES DRAWN AEROBIC AND ANAEROBIC 4ML   Final   Culture  Setup Time     Final   Value: 04/26/2014 20:05     Performed at [facility]   Culture     Final   Value:        BLOOD CULTURE RECEIVED NO GROWTH TO DATE CULTURE WILL BE HELD FOR 5 DAYS BEFORE ISSUING A FINAL NEGATIVE REPORT     Performed at [facility]   Report Status PENDING   Incomplete  MRSA PCR SCREENING     Status: None   Collection Time    04/26/14  6:04 PM      Result Value Ref Range Status   MRSA by PCR NEGATIVE  NEGATIVE Final   Comment:            The GeneXpert MRSA Assay (FDA     approved for NASAL specimens     only), is one component of a     comprehensive MRSA colonization     surveillance program. It is not     intended to diagnose MRSA     infection nor to guide or     monitor treatment for     MRSA infections.     Studies: No results found.  Scheduled Meds: . benztropine  0.5 mg Oral Daily  . enoxaparin (LOVENOX) injection  40 mg Subcutaneous Q24H  . lithium carbonate  300 mg Oral BID  . piperacillin-tazobactam (ZOSYN)  IV  3.375 g Intravenous 3 times per day  . traZODone  50 mg Oral QHS  . vancomycin  1,000 mg Intravenous Q12H   Continuous Infusions: . sodium chloride 100 mL/hr at 04/28/14 7673    Active Problems:   Hypotension   Altered mental state   SIRS (systemic inflammatory response syndrome)    Time spent: 30 min    Daryl Campbell, [facility]  [facility] Pager [phone]. If 7PM-7AM, please contact night-coverage at [email or web address], password [facility] 04/28/2014, 2:40 PM  LOS: 2 days

## 2014-04-29 ENCOUNTER — Inpatient Hospital Stay (HOSPITAL_COMMUNITY): Payer: Medicare Other

## 2014-04-29 DIAGNOSIS — R404 Transient alteration of awareness: Secondary | ICD-10-CM

## 2014-04-29 NOTE — Progress Notes (Signed)
Patient is set to discharge back to [facility] today. CSW spoke with Butch Penny @ [facility] (ph#: [phone]) they will pick patient up after 2:00. RN, Kirstin aware. Discharge packet in [place].   Raynaldo Opitz, [facility] Clinical Social Worker cell #: [phone]

## 2014-04-29 NOTE — Care Management Note (Signed)
    Page 1 of 1   04/29/2014     1:00:01 PM CARE MANAGEMENT NOTE 04/29/2014  Patient:  Daryl Campbell,Daryl Campbell   Account Number:  [id number]  Date Initiated:  04/29/2014  Documentation initiated by:  [facility]  Subjective/Objective Assessment:   63 Y/O M ADMITTED W/UTI.     Action/Plan:   FROM GROUP HOME.   Anticipated DC Date:  04/29/2014   Anticipated DC Plan:  [place]  CM consult      Choice offered to / List presented to:             Status of service:  Completed, signed off Medicare Important Message given?  YES (If response is "NO", the following Medicare IM given date fields will be blank) Date Medicare IM given:  04/29/2014 Medicare IM given by:  [facility] Date Additional Medicare IM given:   Additional Medicare IM given by:    Discharge Disposition:  GROUP HOME  Per UR Regulation:  Reviewed for med. necessity/level of care/duration of stay  If discussed at [place] of Stay Meetings, dates discussed:    Comments:  04/29/14 Skye Plamondon RN,BSN NCM [phone] D/C BACK TO GROUP HOME.NO D/C NEEDS OR ORDERS.

## 2014-04-29 NOTE — Progress Notes (Signed)
Physical Therapy Treatment Patient Details Name: Daryl Campbell MRN: [id number] DOB: 03-21-1951 Today's Date: 04/29/2014    History of Present Illness 62 yo male admitted with sepsis, hypotension, AMS, fall. Hx of bipolar d/o, schizophrenia, Hep C, Sz. Pt is from group home.     PT Comments    Progressing well with mobility. Plan is for return to group home on today per pt.   Follow Up Recommendations  No PT follow up;Supervision for mobility/OOB (initially)     Equipment Recommendations  None recommended by PT    Recommendations for Other Services       Precautions / Restrictions Precautions Precautions: Fall Restrictions Weight Bearing Restrictions: No    Mobility  Bed Mobility Overal bed mobility: Independent                Transfers Overall transfer level: Independent                  Ambulation/Gait Ambulation/Gait assistance: Supervision Ambulation Distance (Feet): 500 Feet Assistive device: None Gait Pattern/deviations: WFL(Within Functional Limits)     General Gait Details: a few instances of unsteadiness but no LOB.    Stairs            Wheelchair Mobility    Modified Rankin (Stroke Patients Only)       Balance           Standing balance support: No upper extremity supported;During functional activity Standing balance-Leahy Scale: Good Standing balance comment: had pt perform head turns while ambulating, stop and go, fast speed/slow speed, 360 degree turns, lateral stepping, backwards walking-all with close guard/supervison level assist. No LOB.                     Cognition Arousal/Alertness: Awake/alert Behavior During Therapy: WFL for tasks assessed/performed Overall Cognitive Status: Within Functional Limits for tasks assessed                      Exercises      General Comments        Pertinent Vitals/Pain Pain Assessment: No/denies pain    Home Living                      Prior  Function            PT Goals (current goals can now be found in the care plan section) Progress towards PT goals: Progressing toward goals    Frequency  Min 3X/week    PT Plan Current plan remains appropriate    Co-evaluation             End of Session Equipment Utilized During Treatment: Gait belt Activity Tolerance: Patient tolerated treatment well Patient left: in bed;with call bell/phone within reach     Time: 1028-1042 PT Time Calculation (min): 14 min  Charges:  $Gait Training: 8-22 mins                    G Codes:      Weston Anna, MPT Pager: [phone]

## 2014-04-29 NOTE — Progress Notes (Signed)
Called to give report to Butch Penny at [facility]. She stated that she did not need me to give her report and would be picking up the patient after 2pm.

## 2014-04-29 NOTE — Discharge Summary (Signed)
Physician Discharge Summary  Daryl Campbell YIR:[id number] DOB: Oct 09, 1950 DOA: 04/26/2014  PCP: No PCP Per Patient  Admit date: 04/26/2014 Discharge date: 04/29/2014  Recommendations for Outpatient Follow-up:  1. F/u with primary care doctor in 1 week to review pending blood cultures  Discharge Diagnoses:  Active Problems:   Hypotension   Altered mental state   SIRS (systemic inflammatory response syndrome)   Discharge Condition: stable, improved  Diet recommendation: healthy heart  Wt Readings from Last 3 Encounters:  04/27/14 80.9 kg (178 lb 5.6 oz)  03/23/14 83.915 kg (185 lb)  12/08/13 81.647 kg (180 lb)    History of present illness:   63 yo male with history of multiple psychiatric illness including depression, bipolar disorder, PSA including tobacco and unclear what other substances, presented to [facility] ED via EMS after found by bystander who witness pt stumbling and falling down. Pt was alert enough at that time to report falling several times on the day prior to admission.  In ED, pt was confused, vitals notable for SBP in 60's, Temp 100.35F, HR in 120's. TRH admitted patient to SDU for further evaluation of presumptive sepsis of an unclear etiology.   Hospital Course:   SIRs, unclear source of infection.  Patient was started on broad spectrum antibiotics with vancomycin and zosyn. U/A was negative and initial chest x ray negative for source.  Urine culture was ultimately negative.  Patient developed increased cough but repeat CXR remained clear.  His temperature trended down to normal and he never had a leukocytosis.  PCP to f/u pending blood cultures.  His antibiotics have been discontinued as his hypotension was most likely secondary to dehydration and less likely due to infection.    Hypotension in setting of hypertension and lactic acidosis may have been due to infectious source versus dehydration and resolved with antibiotics and IVF.    Confusion/delirium was probably  secondary to hypotension and dehydration and resolved with IVF.    Schizophrenia/psychosis, patient was seen by psychiatry.  He continued lithium, cogentin, trazodone.  Sz d/o, remained seizure free.  Weakness, deconditioning with frequent falls.  PT recommended no PT follow up and supervision for mobility.    Hypokalemia, likely iatrogenic due to IVF.  He was given oral potassium repletion.    Tobacco abuse.  Counseled cessation.     Consultants:  none Procedures:  none Antibiotics:  Zosyn and vancomycin 8/24 >> 8/27   Discharge Exam: Filed Vitals:   04/29/14 0914  BP: 138/81  Pulse: 64  Temp: 98 F (36.7 C)  Resp: 20   Filed Vitals:   04/28/14 1439 04/28/14 2038 04/29/14 0526 04/29/14 0914  BP: 134/84 146/90 126/82 138/81  Pulse: 57 64 57 64  Temp: 98.5 F (36.9 C) 98.6 F (37 C) 98.7 F (37.1 C) 98 F (36.7 C)  TempSrc: Oral Oral Oral Oral  Resp: 18 18 20 20   Height:      Weight:      SpO2: 98% 98% 98% 99%    General: WM, No acute distress  HEENT: NCAT, MMM  Cardiovascular: RRR, nl S1, S2 no mrg, 2+ pulses, warm extremities  Respiratory: CTAB, no increased WOB  Abdomen: NABS, soft, NT/ND  MSK: Normal tone and bulk, no LEE  Neuro: Grossly intact  Skin: Multiple abrasions on hands and knees   Discharge Instructions      Discharge Instructions   Call MD for:  difficulty breathing, headache or visual disturbances    Complete by:  As directed  Call MD for:  extreme fatigue    Complete by:  As directed      Call MD for:  hives    Complete by:  As directed      Call MD for:  persistant dizziness or light-headedness    Complete by:  As directed      Call MD for:  persistant nausea and vomiting    Complete by:  As directed      Call MD for:  severe uncontrolled pain    Complete by:  As directed      Call MD for:  temperature >100.4    Complete by:  As directed      Diet - low sodium heart healthy    Complete by:  As directed      Discharge  instructions    Complete by:  As directed   You were hospitalized with confusion, falls and low blood pressure.  Most likely your symptoms were caused by dehydration.  Please drink at least half a gallon of water or other caffeine-free beverage daily.  You were treated for possible infection, however, you did not have a urinary tract infection or pneumonia.  So far, you do not appear to have had a blood stream infection.  You do not need antibiotics when you leave the hospital since we did not find a source of infection, however, your primary care doctor will need to follow up on the pending blood cultures to make sure you did not have a blood stream infection.  It takes about 5 days to get the final results of the blood tests.     Increase activity slowly    Complete by:  As directed             Medication List         benztropine 0.5 MG tablet  Commonly known as:  COGENTIN  Take 0.5 mg by mouth daily.     colchicine 0.6 MG tablet  Take 0.6 mg by mouth daily.     lithium carbonate 300 MG CR tablet  Commonly known as:  LITHOBID  Take 300 mg by mouth 2 (two) times daily.     traZODone 50 MG tablet  Commonly known as:  DESYREL  Take 50 mg by mouth at bedtime.       Follow-up Information   Follow up with No PCP Per Patient. Schedule an appointment as soon as possible for a visit in 1 week.   Specialty:  General Practice       The results of significant diagnostics from this hospitalization (including imaging, microbiology, ancillary and laboratory) are listed below for reference.    Significant Diagnostic Studies: Dg Chest 2 View  04/26/2014   CLINICAL DATA:  Fever.  Syncope.  EXAM: CHEST  2 VIEW  COMPARISON:  11/28/2012  FINDINGS: Lateral view degraded by patient arm position. Midline trachea. Normal heart size and mediastinal contours. No pleural effusion or pneumothorax. Scattered areas of scarring, including within the left upper and both lower lobes.  IMPRESSION: No acute  cardiopulmonary disease.   Electronically Signed   By: Abigail Miyamoto M.D.   On: 04/26/2014 14:39   Ct Head Wo Contrast  04/26/2014   CLINICAL DATA:  Altered mental status, gait disturbance, fell  EXAM: CT HEAD WITHOUT CONTRAST  TECHNIQUE: Contiguous axial images were obtained from the base of the skull through the vertex without intravenous contrast.  COMPARISON:  04/24/2014  FINDINGS: No evidence of infarct, hemorrhage, or extra-axial  fluid. Stable moderate diffuse atrophy and mild deep white matter low attenuation. No hydrocephalus. Calvarium intact.  IMPRESSION: No acute findings.   Electronically Signed   By: Skipper Cliche M.D.   On: 04/26/2014 14:37   Ct Head Wo Contrast  04/24/2014   CLINICAL DATA:  Fall.  EXAM: CT HEAD WITHOUT CONTRAST  TECHNIQUE: Contiguous axial images were obtained from the base of the skull through the vertex without intravenous contrast.  COMPARISON:  05/31/2013  FINDINGS: No acute cortical infarct, hemorrhage, or mass lesion ispresent. Ventricles are of normal size. No significant extra-axial fluid collection is present. The paranasal sinuses andmastoid air cells are clear. The osseous skull is intact.  IMPRESSION: Normal brain.   Electronically Signed   By: Kerby Moors M.D.   On: 04/24/2014 18:45   Dg Knee Complete 4 Views Left  04/24/2014   CLINICAL DATA:  Fall.  EXAM: LEFT KNEE - COMPLETE 4+ VIEW  COMPARISON:  None.  FINDINGS: There is no evidence of fracture, dislocation, or joint effusion. There is mild degenerative change and sharpening of the tibial spines. No joint effusion noted.  IMPRESSION: 1. No acute findings. 2. Mild degenerative change.   Electronically Signed   By: Kerby Moors M.D.   On: 04/24/2014 19:37    Microbiology: Recent Results (from the past 240 hour(s))  CULTURE, BLOOD (ROUTINE X 2)     Status: None   Collection Time    04/26/14  2:59 PM      Result Value Ref Range Status   Specimen Description BLOOD RIGHT ANTECUBITAL   Final   Special  Requests BOTTLES DRAWN AEROBIC AND ANAEROBIC 4ML   Final   Culture  Setup Time     Final   Value: 04/26/2014 20:06     Performed at [facility]   Culture     Final   Value:        BLOOD CULTURE RECEIVED NO GROWTH TO DATE CULTURE WILL BE HELD FOR 5 DAYS BEFORE ISSUING A FINAL NEGATIVE REPORT     Performed at [facility]   Report Status PENDING   Incomplete  CULTURE, BLOOD (ROUTINE X 2)     Status: None   Collection Time    04/26/14  2:59 PM      Result Value Ref Range Status   Specimen Description BLOOD LEFT ANTECUBITAL   Final   Special Requests BOTTLES DRAWN AEROBIC AND ANAEROBIC 4ML   Final   Culture  Setup Time     Final   Value: 04/26/2014 20:05     Performed at [facility]   Culture     Final   Value:        BLOOD CULTURE RECEIVED NO GROWTH TO DATE CULTURE WILL BE HELD FOR 5 DAYS BEFORE ISSUING A FINAL NEGATIVE REPORT     Performed at [facility]   Report Status PENDING   Incomplete  MRSA PCR SCREENING     Status: None   Collection Time    04/26/14  6:04 PM      Result Value Ref Range Status   MRSA by PCR NEGATIVE  NEGATIVE Final   Comment:            The GeneXpert MRSA Assay (FDA     approved for NASAL specimens     only), is one component of a     comprehensive MRSA colonization     surveillance program. It is not     intended to  diagnose MRSA     infection nor to guide or     monitor treatment for     MRSA infections.  URINE CULTURE     Status: None   Collection Time    04/27/14  8:32 AM      Result Value Ref Range Status   Specimen Description URINE, RANDOM   Final   Special Requests NONE   Final   Culture  Setup Time     Final   Value: 04/27/2014 12:09     Performed at [place]     Final   Value: NO GROWTH     Performed at [facility]   Culture     Final   Value: NO GROWTH     Performed at [facility]   Report Status 04/28/2014 FINAL   Final     Labs: Basic Metabolic  Panel:  Recent Labs Lab 04/24/14 1859 04/26/14 1405 04/27/14 0340  NA 143 141 142  K 4.2 3.6* 3.5*  CL 107 102 109  CO2 25 20 23   GLUCOSE 116* 148* 113*  BUN 14 11 8   CREATININE 1.05 1.34 1.03  CALCIUM 9.3 9.7 8.8   Liver Function Tests:  Recent Labs Lab 04/27/14 0340  AST 21  ALT 19  ALKPHOS 78  BILITOT 1.1  PROT 6.3  ALBUMIN 3.1*   No results found for this basename: LIPASE, AMYLASE,  in the last 168 hours No results found for this basename: AMMONIA,  in the last 168 hours CBC:  Recent Labs Lab 04/24/14 1859 04/26/14 1405 04/27/14 0340  WBC 6.9 5.3 6.3  NEUTROABS 5.0 3.8  --   HGB 12.4* 13.9 12.2*  HCT 36.5* 39.3 34.8*  MCV 89.2 90.3 89.0  PLT 181 192 163   Cardiac Enzymes:  Recent Labs Lab 04/26/14 1405 04/26/14 1959  CKTOTAL 96 144  CKMB  --  3.4  TROPONINI <0.30  --    BNP: BNP (last 3 results) No results found for this basename: PROBNP,  in the last 8760 hours CBG: No results found for this basename: GLUCAP,  in the last 168 hours  Time coordinating discharge: 35 minutes  Signed:  Terran Hollenkamp  [facility] 04/29/2014, 9:50 AM

## 2014-05-02 LAB — CULTURE, BLOOD (ROUTINE X 2)
Culture: NO GROWTH
Culture: NO GROWTH

## 2014-07-06 ENCOUNTER — Emergency Department (HOSPITAL_COMMUNITY)
Admission: EM | Admit: 2014-07-06 | Discharge: 2014-07-06 | Disposition: A | Discharge status: Home / Self Care | Payer: Medicare Other | Attending: Emergency Medicine | Admitting: Emergency Medicine

## 2014-07-06 ENCOUNTER — Encounter (HOSPITAL_COMMUNITY): Payer: Self-pay | Admitting: Emergency Medicine

## 2014-07-06 DIAGNOSIS — R52 Pain, unspecified: Secondary | ICD-10-CM

## 2014-07-06 DIAGNOSIS — S80212A Abrasion, left knee, initial encounter: Secondary | ICD-10-CM | POA: Diagnosis not present

## 2014-07-06 DIAGNOSIS — Z79899 Other long term (current) drug therapy: Secondary | ICD-10-CM | POA: Diagnosis not present

## 2014-07-06 DIAGNOSIS — Z8619 Personal history of other infectious and parasitic diseases: Secondary | ICD-10-CM | POA: Insufficient documentation

## 2014-07-06 DIAGNOSIS — Y9302 Activity, running: Secondary | ICD-10-CM | POA: Insufficient documentation

## 2014-07-06 DIAGNOSIS — W010XXA Fall on same level from slipping, tripping and stumbling without subsequent striking against object, initial encounter: Secondary | ICD-10-CM | POA: Diagnosis not present

## 2014-07-06 DIAGNOSIS — S8992XA Unspecified injury of left lower leg, initial encounter: Secondary | ICD-10-CM

## 2014-07-06 DIAGNOSIS — R21 Rash and other nonspecific skin eruption: Secondary | ICD-10-CM | POA: Insufficient documentation

## 2014-07-06 DIAGNOSIS — Z720 Tobacco use: Secondary | ICD-10-CM | POA: Insufficient documentation

## 2014-07-06 DIAGNOSIS — I10 Essential (primary) hypertension: Secondary | ICD-10-CM | POA: Insufficient documentation

## 2014-07-06 DIAGNOSIS — F259 Schizoaffective disorder, unspecified: Secondary | ICD-10-CM | POA: Insufficient documentation

## 2014-07-06 DIAGNOSIS — G40909 Epilepsy, unspecified, not intractable, without status epilepticus: Secondary | ICD-10-CM | POA: Insufficient documentation

## 2014-07-06 DIAGNOSIS — F319 Bipolar disorder, unspecified: Secondary | ICD-10-CM | POA: Diagnosis not present

## 2014-07-06 DIAGNOSIS — Y9289 Other specified places as the place of occurrence of the external cause: Secondary | ICD-10-CM | POA: Diagnosis not present

## 2014-07-06 MED ORDER — HYDROCORTISONE 1 % EX CREA: TOPICAL_CREAM | CUTANEOUS | Status: DC

## 2014-07-06 MED ORDER — METHOCARBAMOL 500 MG PO TABS: 500.0000 mg | ORAL_TABLET | Freq: Two times a day (BID) | ORAL | Status: DC

## 2014-07-06 NOTE — ED Notes (Signed)
Patient came out to nurses station and advised that "I have a rash on my arms, I know I wasn't supposed to scratch it, but I did".
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
## 2014-07-06 NOTE — ED Notes (Signed)
Patient states was "jogging last week and I tripped".   Patient complains of L knee/thigh pain.  Patient states has not taken anything for pain at home.

## 2014-07-06 NOTE — Discharge Instructions (Signed)
Take robaxin as needed for muscle strain. Use cortisone cream as needed for your rash. Refer to attached documents for more information.

## 2014-09-08 ENCOUNTER — Emergency Department (HOSPITAL_COMMUNITY)
Admission: EM | Admit: 2014-09-08 | Discharge: 2014-09-08 | Disposition: A | Discharge status: Home / Self Care | Payer: Medicare Other | Attending: Emergency Medicine | Admitting: Emergency Medicine

## 2014-09-08 ENCOUNTER — Emergency Department (HOSPITAL_COMMUNITY): Payer: Medicare Other

## 2014-09-08 ENCOUNTER — Encounter (HOSPITAL_COMMUNITY): Payer: Self-pay | Admitting: Emergency Medicine

## 2014-09-08 DIAGNOSIS — Z7952 Long term (current) use of systemic steroids: Secondary | ICD-10-CM | POA: Diagnosis not present

## 2014-09-08 DIAGNOSIS — Y998 Other external cause status: Secondary | ICD-10-CM | POA: Insufficient documentation

## 2014-09-08 DIAGNOSIS — Z008 Encounter for other general examination: Secondary | ICD-10-CM | POA: Diagnosis not present

## 2014-09-08 DIAGNOSIS — Z79899 Other long term (current) drug therapy: Secondary | ICD-10-CM | POA: Insufficient documentation

## 2014-09-08 DIAGNOSIS — W010XXA Fall on same level from slipping, tripping and stumbling without subsequent striking against object, initial encounter: Secondary | ICD-10-CM | POA: Insufficient documentation

## 2014-09-08 DIAGNOSIS — Z8619 Personal history of other infectious and parasitic diseases: Secondary | ICD-10-CM | POA: Diagnosis not present

## 2014-09-08 DIAGNOSIS — Y9389 Activity, other specified: Secondary | ICD-10-CM | POA: Insufficient documentation

## 2014-09-08 DIAGNOSIS — Z720 Tobacco use: Secondary | ICD-10-CM | POA: Insufficient documentation

## 2014-09-08 DIAGNOSIS — S0990XA Unspecified injury of head, initial encounter: Secondary | ICD-10-CM | POA: Diagnosis present

## 2014-09-08 DIAGNOSIS — Y92128 Other place in nursing home as the place of occurrence of the external cause: Secondary | ICD-10-CM | POA: Insufficient documentation

## 2014-09-08 DIAGNOSIS — F259 Schizoaffective disorder, unspecified: Secondary | ICD-10-CM | POA: Insufficient documentation

## 2014-09-08 DIAGNOSIS — I10 Essential (primary) hypertension: Secondary | ICD-10-CM | POA: Insufficient documentation

## 2014-09-08 DIAGNOSIS — R443 Hallucinations, unspecified: Secondary | ICD-10-CM | POA: Diagnosis not present

## 2014-09-08 DIAGNOSIS — R404 Transient alteration of awareness: Secondary | ICD-10-CM | POA: Diagnosis not present

## 2014-09-08 DIAGNOSIS — R55 Syncope and collapse: Secondary | ICD-10-CM | POA: Diagnosis not present

## 2014-09-08 DIAGNOSIS — F3131 Bipolar disorder, current episode depressed, mild: Secondary | ICD-10-CM | POA: Diagnosis not present

## 2014-09-08 DIAGNOSIS — R9431 Abnormal electrocardiogram [ECG] [EKG]: Secondary | ICD-10-CM | POA: Diagnosis not present

## 2014-09-08 DIAGNOSIS — S0003XA Contusion of scalp, initial encounter: Secondary | ICD-10-CM | POA: Insufficient documentation

## 2014-09-08 DIAGNOSIS — R42 Dizziness and giddiness: Secondary | ICD-10-CM | POA: Diagnosis not present

## 2014-09-08 LAB — COMPREHENSIVE METABOLIC PANEL
ALBUMIN: 4.3 g/dL (ref 3.5–5.2)
ALK PHOS: 82 U/L (ref 39–117)
ALT: 29 U/L (ref 0–53)
AST: 32 U/L (ref 0–37)
Anion gap: 8 (ref 5–15)
BUN: 20 mg/dL (ref 6–23)
CO2: 26 mmol/L (ref 19–32)
CREATININE: 1.11 mg/dL (ref 0.50–1.35)
Calcium: 10.2 mg/dL (ref 8.4–10.5)
Chloride: 106 mEq/L (ref 96–112)
GFR, EST AFRICAN AMERICAN: 80 mL/min — AB (ref >90)
GFR, EST NON AFRICAN AMERICAN: 69 mL/min — AB (ref >90)
Glucose, Bld: 108 mg/dL — ABNORMAL HIGH (ref 70–99)
POTASSIUM: 4.2 mmol/L (ref 3.5–5.1)
Sodium: 140 mmol/L (ref 135–145)
Total Bilirubin: 0.9 mg/dL (ref 0.3–1.2)
Total Protein: 7.8 g/dL (ref 6.0–8.3)

## 2014-09-08 LAB — CBC
HCT: 41 % (ref 39.0–52.0)
Hemoglobin: 13.6 g/dL (ref 13.0–17.0)
MCH: 30.7 pg (ref 26.0–34.0)
MCHC: 33.2 g/dL (ref 30.0–36.0)
MCV: 92.6 fL (ref 78.0–100.0)
PLATELETS: 227 10*3/uL (ref 150–400)
RBC: 4.43 MIL/uL (ref 4.22–5.81)
RDW: 12.9 % (ref 11.5–15.5)
WBC: 6.6 10*3/uL (ref 4.0–10.5)

## 2014-09-08 LAB — RAPID URINE DRUG SCREEN, HOSP PERFORMED
Amphetamines: NOT DETECTED
Barbiturates: NOT DETECTED
Benzodiazepines: NOT DETECTED
Cocaine: NOT DETECTED
Opiates: NOT DETECTED
TETRAHYDROCANNABINOL: NOT DETECTED

## 2014-09-08 LAB — ETHANOL

## 2014-09-08 LAB — LITHIUM LEVEL: LITHIUM LVL: 0.47 mmol/L — AB (ref 0.80–1.40)

## 2014-09-08 NOTE — ED Notes (Signed)
Pt BIB EMS, pt lives at [facility], states that he tripped and fell and was unable to get up, because he kept falling down. Pt states hx AVH "especially at night. I talk to the stars." Pt calm and cooperative, pleasant in triage. Denies SI/HI.

## 2014-09-08 NOTE — ED Notes (Signed)
Pt verbalized understanding discharge instructions. In no acute distress.  Pt being transported back to facility by [employer].
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
## 2014-09-08 NOTE — Discharge Instructions (Signed)
It was our pleasure to provide your ER care today - we hope that you feel better.  Follow up with primary care doctor in the next couple days.  Return to ER if worse, new symptoms, severe pain, change in mental status, fevers, weak/fainting, other concern.      Facial or Scalp Contusion A facial or scalp contusion is a deep bruise on the face or head. Injuries to the face and head generally cause a lot of swelling, especially around the eyes. Contusions are the result of an injury that caused bleeding under the skin. The contusion may turn blue, purple, or yellow. Minor injuries will give you a painless contusion, but more severe contusions may stay painful and swollen for a few weeks.  CAUSES  A facial or scalp contusion is caused by a blunt injury or trauma to the face or head area.  SIGNS AND SYMPTOMS   Swelling of the injured area.   Discoloration of the injured area.   Tenderness, soreness, or pain in the injured area.  DIAGNOSIS  The diagnosis can be made by taking a medical history and doing a physical exam. An X-ray exam, CT scan, or MRI may be needed to determine if there are any associated injuries, such as broken bones (fractures). TREATMENT  Often, the best treatment for a facial or scalp contusion is applying cold compresses to the injured area. Over-the-counter medicines may also be recommended for pain control.  HOME CARE INSTRUCTIONS   Only take over-the-counter or prescription medicines as directed by your health care provider.   Apply ice to the injured area.   Put ice in a plastic bag.   Place a towel between your skin and the bag.   Leave the ice on for 20 minutes, 2-3 times a day.  SEEK MEDICAL CARE IF:  You have bite problems.   You have pain with chewing.   You are concerned about facial defects. SEEK IMMEDIATE MEDICAL CARE IF:  You have severe pain or a headache that is not relieved by medicine.   You have unusual sleepiness,  confusion, or personality changes.   You throw up (vomit).   You have a persistent nosebleed.   You have double vision or blurred vision.   You have fluid drainage from your nose or ear.   You have difficulty walking or using your arms or legs.  MAKE SURE YOU:   Understand these instructions.  Will watch your condition.  Will get help right away if you are not doing well or get worse. Document Released: 09/27/2004 Document Revised: 06/10/2013 Document Reviewed: 04/02/2013 [facility] Patient Information 2015 [place], [place]. This information is not intended to replace advice given to you by your health care provider. Make sure you discuss any questions you have with your health care provider.    Head Injury You have received a head injury. It does not appear serious at this time. Headaches and vomiting are common following head injury. It should be easy to awaken from sleeping. Sometimes it is necessary for you to stay in the emergency department for a while for observation. Sometimes admission to the hospital may be needed. After injuries such as yours, most problems occur within the first 24 hours, but side effects may occur up to 7-10 days after the injury. It is important for you to carefully monitor your condition and contact your health care provider or seek immediate medical care if there is a change in your condition. WHAT ARE THE TYPES OF HEAD  INJURIES? Head injuries can be as minor as a bump. Some head injuries can be more severe. More severe head injuries include:  A jarring injury to the brain (concussion).  A bruise of the brain (contusion). This mean there is bleeding in the brain that can cause swelling.  A cracked skull (skull fracture).  Bleeding in the brain that collects, clots, and forms a bump (hematoma). WHAT CAUSES A HEAD INJURY? A serious head injury is most likely to happen to someone who is in a car wreck and is not wearing a seat belt. Other causes of  major head injuries include bicycle or motorcycle accidents, sports injuries, and falls. HOW ARE HEAD INJURIES DIAGNOSED? A complete history of the event leading to the injury and your current symptoms will be helpful in diagnosing head injuries. Many times, pictures of the brain, such as CT or MRI are needed to see the extent of the injury. Often, an overnight hospital stay is necessary for observation.  WHEN SHOULD I SEEK IMMEDIATE MEDICAL CARE?  You should get help right away if:  You have confusion or drowsiness.  You feel sick to your stomach (nauseous) or have continued, forceful vomiting.  You have dizziness or unsteadiness that is getting worse.  You have severe, continued headaches not relieved by medicine. Only take over-the-counter or prescription medicines for pain, fever, or discomfort as directed by your health care provider.  You do not have normal function of the arms or legs or are unable to walk.  You notice changes in the black spots in the center of the colored part of your eye (pupil).  You have a clear or bloody fluid coming from your nose or ears.  You have a loss of vision. During the next 24 hours after the injury, you must stay with someone who can watch you for the warning signs. This person should contact local emergency services (911 in the [place] if you have seizures, you become unconscious, or you are unable to wake up. HOW CAN I PREVENT A HEAD INJURY IN THE FUTURE? The most important factor for preventing major head injuries is avoiding motor vehicle accidents. To minimize the potential for damage to your head, it is crucial to wear seat belts while riding in motor vehicles. Wearing helmets while bike riding and playing collision sports (like football) is also helpful. Also, avoiding dangerous activities around the house will further help reduce your risk of head injury.  WHEN CAN I RETURN TO NORMAL ACTIVITIES AND ATHLETICS? You should be reevaluated by your  health care provider before returning to these activities. If you have any of the following symptoms, you should not return to activities or contact sports until 1 week after the symptoms have stopped:  Persistent headache.  Dizziness or vertigo.  Poor attention and concentration.  Confusion.  Memory problems.  Nausea or vomiting.  Fatigue or tire easily.  Irritability.  Intolerant of bright lights or loud noises.  Anxiety or depression.  Disturbed sleep. MAKE SURE YOU:   Understand these instructions.  Will watch your condition.  Will get help right away if you are not doing well or get worse. Document Released: 08/20/2005 Document Revised: 08/25/2013 Document Reviewed: 04/27/2013 [facility] Patient Information 2015 [place], [place]. This information is not intended to replace advice given to you by your health care provider. Make sure you discuss any questions you have with your health care provider.     Fall Prevention and Home Safety Falls cause injuries and can affect all  age groups. It is possible to use preventive measures to significantly decrease the likelihood of falls. There are many simple measures which can make your home safer and prevent falls. OUTDOORS  Repair cracks and edges of walkways and driveways.  Remove high doorway thresholds.  Trim shrubbery on the main path into your home.  Have good outside lighting.  Clear walkways of tools, rocks, debris, and clutter.  Check that handrails are not broken and are securely fastened. Both sides of steps should have handrails.  Have leaves, snow, and ice cleared regularly.  Use sand or salt on walkways during winter months.  In the garage, clean up grease or oil spills. BATHROOM  Install night lights.  Install grab bars by the toilet and in the tub and shower.  Use non-skid mats or decals in the tub or shower.  Place a plastic non-slip stool in the shower to sit on, if needed.  Keep floors dry  and clean up all water on the floor immediately.  Remove soap buildup in the tub or shower on a regular basis.  Secure bath mats with non-slip, double-sided rug tape.  Remove throw rugs and tripping hazards from the floors. BEDROOMS  Install night lights.  Make sure a bedside light is easy to reach.  Do not use oversized bedding.  Keep a telephone by your bedside.  Have a firm chair with side arms to use for getting dressed.  Remove throw rugs and tripping hazards from the floor. KITCHEN  Keep handles on pots and pans turned toward the center of the stove. Use back burners when possible.  Clean up spills quickly and allow time for drying.  Avoid walking on wet floors.  Avoid hot utensils and knives.  Position shelves so they are not too high or low.  Place commonly used objects within easy reach.  If necessary, use a sturdy step stool with a grab bar when reaching.  Keep electrical cables out of the way.  Do not use floor polish or wax that makes floors slippery. If you must use wax, use non-skid floor wax.  Remove throw rugs and tripping hazards from the floor. STAIRWAYS  Never leave objects on stairs.  Place handrails on both sides of stairways and use them. Fix any loose handrails. Make sure handrails on both sides of the stairways are as long as the stairs.  Check carpeting to make sure it is firmly attached along stairs. Make repairs to worn or loose carpet promptly.  Avoid placing throw rugs at the top or bottom of stairways, or properly secure the rug with carpet tape to prevent slippage. Get rid of throw rugs, if possible.  Have an electrician put in a light switch at the top and bottom of the stairs. OTHER FALL PREVENTION TIPS  Wear low-heel or rubber-soled shoes that are supportive and fit well. Wear closed toe shoes.  When using a stepladder, make sure it is fully opened and both spreaders are firmly locked. Do not climb a closed stepladder.  Add  color or contrast paint or tape to grab bars and handrails in your home. Place contrasting color strips on first and last steps.  Learn and use mobility aids as needed. Install an electrical emergency response system.  Turn on lights to avoid dark areas. Replace light bulbs that burn out immediately. Get light switches that glow.  Arrange furniture to create clear pathways. Keep furniture in the same place.  Firmly attach carpet with non-skid or double-sided tape.  Eliminate  uneven floor surfaces.  Select a carpet pattern that does not visually hide the edge of steps.  Be aware of all pets. OTHER HOME SAFETY TIPS  Set the water temperature for 120 F (48.8 C).  Keep emergency numbers on or near the telephone.  Keep smoke detectors on every level of the home and near sleeping areas. Document Released: 08/10/2002 Document Revised: 02/19/2012 Document Reviewed: 11/09/2011 [facility] Patient Information 2015 [place], [place]. This information is not intended to replace advice given to you by your health care provider. Make sure you discuss any questions you have with your health care provider.
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
## 2014-09-08 NOTE — ED Notes (Signed)
Pt has been able to ambulate without difficulty.

## 2014-09-09 DIAGNOSIS — F250 Schizoaffective disorder, bipolar type: Secondary | ICD-10-CM | POA: Diagnosis not present

## 2014-09-15 DIAGNOSIS — F250 Schizoaffective disorder, bipolar type: Secondary | ICD-10-CM | POA: Diagnosis not present

## 2014-10-13 DIAGNOSIS — Z79899 Other long term (current) drug therapy: Secondary | ICD-10-CM | POA: Diagnosis not present

## 2014-10-13 DIAGNOSIS — F250 Schizoaffective disorder, bipolar type: Secondary | ICD-10-CM | POA: Diagnosis not present

## 2014-10-28 ENCOUNTER — Emergency Department (HOSPITAL_COMMUNITY)
Admission: EM | Admit: 2014-10-28 | Discharge: 2014-10-28 | Disposition: A | Discharge status: Home / Self Care | Payer: Medicare Other | Attending: Emergency Medicine | Admitting: Emergency Medicine

## 2014-10-28 ENCOUNTER — Encounter (HOSPITAL_COMMUNITY): Payer: Self-pay | Admitting: *Deleted

## 2014-10-28 DIAGNOSIS — F209 Schizophrenia, unspecified: Secondary | ICD-10-CM | POA: Diagnosis not present

## 2014-10-28 DIAGNOSIS — Z7952 Long term (current) use of systemic steroids: Secondary | ICD-10-CM | POA: Insufficient documentation

## 2014-10-28 DIAGNOSIS — F3131 Bipolar disorder, current episode depressed, mild: Secondary | ICD-10-CM | POA: Insufficient documentation

## 2014-10-28 DIAGNOSIS — Z720 Tobacco use: Secondary | ICD-10-CM | POA: Diagnosis not present

## 2014-10-28 DIAGNOSIS — Z8619 Personal history of other infectious and parasitic diseases: Secondary | ICD-10-CM | POA: Insufficient documentation

## 2014-10-28 DIAGNOSIS — I10 Essential (primary) hypertension: Secondary | ICD-10-CM | POA: Insufficient documentation

## 2014-10-28 DIAGNOSIS — B351 Tinea unguium: Secondary | ICD-10-CM

## 2014-10-28 DIAGNOSIS — B49 Unspecified mycosis: Secondary | ICD-10-CM | POA: Diagnosis not present

## 2014-10-28 DIAGNOSIS — M79674 Pain in right toe(s): Secondary | ICD-10-CM | POA: Diagnosis not present

## 2014-10-28 DIAGNOSIS — M79675 Pain in left toe(s): Secondary | ICD-10-CM | POA: Diagnosis not present

## 2014-10-28 DIAGNOSIS — Z79899 Other long term (current) drug therapy: Secondary | ICD-10-CM | POA: Insufficient documentation

## 2014-10-28 DIAGNOSIS — B353 Tinea pedis: Secondary | ICD-10-CM | POA: Insufficient documentation

## 2014-10-28 MED ORDER — CLOTRIMAZOLE 1 % EX CREA: TOPICAL_CREAM | CUTANEOUS | Status: DC

## 2014-10-28 MED ORDER — PERMETHRIN 5 % EX CREA: TOPICAL_CREAM | CUTANEOUS | Status: DC

## 2014-10-28 NOTE — Discharge Instructions (Signed)
Take the prescribed medication as directed. Follow-up with podiatrist. Return to the ED for new or worsening symptoms.

## 2014-10-28 NOTE — ED Provider Notes (Signed)
CSN: [id number]     Arrival date & time 10/28/14  8416 History  This chart was scribed for Quincy Carnes, PA-C working with Nat Christen, MD by Mercy Moore, ED Scribe. This patient was seen in room TR05C/TR05C and the patient's care was started at 10:35 AM.   Chief Complaint  Patient presents with  . Toe Pain   The history is provided by the patient. No language interpreter was used.   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 male who presents to the Emergency Department complaining of toe pain, for one year. Patient's toes nails are thickened, discolored and are overgrown. Patient states that this nails and toe and rubbing against each other and causing him pain.  Patient denies personal history of Diabetes. Patient has never seen a podiatrist.  No fever, chills,sweats.  Patient was observed to have bed bugs crawling on him in triage.  VSS on arrival.  Past Medical History  Diagnosis Date  . Hypertension   . Psychiatric disorder   . Hepatitis C   . Seizures   . Schizophrenia   . Bipolar affective   . Depression   . Schizoaffective disorder    History reviewed. No pertinent past surgical history. No family history on file. History  Substance Use Topics  . Smoking status: Current Every Day Smoker -- 1.00 packs/day for 3 years    Types: Cigarettes  . Smokeless tobacco: Never Used  . Alcohol Use: No    Review of Systems  Constitutional: Negative for fever and chills.  Musculoskeletal: Positive for arthralgias (toe pain).  Skin:       Thick, discolored nails   All other systems reviewed and are negative.  Allergies  Review of patient's allergies indicates no known allergies.  Home Medications   Prior to Admission medications   Medication Sig Start Date End Date Taking? Authorizing Provider  benztropine (COGENTIN) 0.5 MG tablet Take 0.5 mg by mouth daily.     Historical Provider, MD  colchicine 0.6 MG tablet Take 0.6 mg by mouth daily.    Historical Provider, MD   hydrocortisone cream 1 % Apply to affected area 2 times daily 07/06/14   Alvina Chou, PA-C  lithium carbonate (LITHOBID) 300 MG CR tablet Take 300 mg by mouth 2 (two) times daily.    Historical Provider, MD  methocarbamol (ROBAXIN) 500 MG tablet Take 1 tablet (500 mg total) by mouth 2 (two) times daily. 07/06/14   Kaitlyn Szekalski, PA-C  traZODone (DESYREL) 50 MG tablet Take 50 mg by mouth at bedtime.    Historical Provider, MD   Triage Vitals: BP 122/75 mmHg  Pulse 62  Temp(Src) 98 F (36.7 C) (Oral)  Resp 16  Ht 5\' 8"  (1.727 m)  Wt 185 lb (83.915 kg)  BMI 28.14 kg/m2  SpO2 96% Physical Exam  Constitutional: He is oriented to person, place, and time. He appears well-developed and well-nourished. No distress.  HENT:  Head: Normocephalic and atraumatic.  Eyes: EOM are normal.  Neck: Neck supple. No tracheal deviation present.  Cardiovascular: Normal rate.   Pulmonary/Chest: Effort normal. No respiratory distress.  Musculoskeletal: Normal range of motion.  Thick, mycotic, and deformed toenails of both feet; fungal infection present in between toes, no overlying erythema or signs of cellulitis, full range of motion of all toes maintained Skin on soles of feet is dry and cracked  Neurological: He is alert and oriented to person, place, and time.  Skin: Skin is warm and dry. No rash noted.  No rashes noted  Psychiatric: He has a normal mood and affect. His behavior is normal.  Nursing note and vitals reviewed.   ED Course  Procedures (including critical care time)  COORDINATION OF CARE: 10:40 AM- Plans to refer patient to podiatrist. Discussed treatment plan with patient at bedside and patient agreed to plan.   Labs Review Labs Reviewed - No data to display  Imaging Review No results found.   EKG Interpretation None      MDM   Final diagnoses:  Tinea pedis of both feet  Mycotic toenails   64 year old male with toe pain for the past year. On exam, his toenails  are mycotic and overgrown. He also has fungal infection present in between his toes of both feet.  There are no signs of infection on his feet.  Patient was observed to have bed bugs crawling on him in triage, however I do not see any further bugs noted on my exam. Patient will be started on Lotrimin cream as well as permethrin. He is encouraged to follow-up with podiatrist.  Discussed plan with patient, he/she acknowledged understanding and agreed with plan of care.  Return precautions given for new or worsening symptoms.  I personally performed the services described in this documentation, which was scribed in my presence. The recorded information has been reviewed and is accurate.  Larene Pickett, PA-C 10/28/14 1103  Nat Christen, MD 10/28/14 [phone]

## 2014-10-28 NOTE — ED Notes (Signed)
Pt states toes have been hurting him x 1 year b/c they're rubbing against each other.  Bed bugs seen crawling on pt in triage.

## 2014-11-10 DIAGNOSIS — F250 Schizoaffective disorder, bipolar type: Secondary | ICD-10-CM | POA: Diagnosis not present

## 2014-12-10 DIAGNOSIS — F250 Schizoaffective disorder, bipolar type: Secondary | ICD-10-CM | POA: Diagnosis not present

## 2014-12-14 DIAGNOSIS — F259 Schizoaffective disorder, unspecified: Secondary | ICD-10-CM | POA: Diagnosis not present

## 2015-01-14 DIAGNOSIS — F250 Schizoaffective disorder, bipolar type: Secondary | ICD-10-CM | POA: Diagnosis not present

## 2015-01-28 DIAGNOSIS — F250 Schizoaffective disorder, bipolar type: Secondary | ICD-10-CM | POA: Diagnosis not present

## 2015-02-09 DIAGNOSIS — M109 Gout, unspecified: Secondary | ICD-10-CM | POA: Diagnosis not present

## 2015-02-10 DIAGNOSIS — F250 Schizoaffective disorder, bipolar type: Secondary | ICD-10-CM | POA: Diagnosis not present

## 2015-02-13 ENCOUNTER — Emergency Department (HOSPITAL_COMMUNITY): Payer: Medicare Other

## 2015-02-13 ENCOUNTER — Encounter (HOSPITAL_COMMUNITY): Payer: Self-pay

## 2015-02-13 ENCOUNTER — Emergency Department (HOSPITAL_COMMUNITY)
Admission: EM | Admit: 2015-02-13 | Discharge: 2015-02-13 | Disposition: A | Discharge status: Home / Self Care | Payer: Medicare Other | Attending: Emergency Medicine | Admitting: Emergency Medicine

## 2015-02-13 DIAGNOSIS — Z79899 Other long term (current) drug therapy: Secondary | ICD-10-CM | POA: Diagnosis not present

## 2015-02-13 DIAGNOSIS — Y9289 Other specified places as the place of occurrence of the external cause: Secondary | ICD-10-CM | POA: Diagnosis not present

## 2015-02-13 DIAGNOSIS — S0990XA Unspecified injury of head, initial encounter: Secondary | ICD-10-CM | POA: Insufficient documentation

## 2015-02-13 DIAGNOSIS — W01198A Fall on same level from slipping, tripping and stumbling with subsequent striking against other object, initial encounter: Secondary | ICD-10-CM | POA: Diagnosis not present

## 2015-02-13 DIAGNOSIS — Y9389 Activity, other specified: Secondary | ICD-10-CM | POA: Insufficient documentation

## 2015-02-13 DIAGNOSIS — M25562 Pain in left knee: Secondary | ICD-10-CM

## 2015-02-13 DIAGNOSIS — G40909 Epilepsy, unspecified, not intractable, without status epilepticus: Secondary | ICD-10-CM | POA: Insufficient documentation

## 2015-02-13 DIAGNOSIS — Z8619 Personal history of other infectious and parasitic diseases: Secondary | ICD-10-CM | POA: Diagnosis not present

## 2015-02-13 DIAGNOSIS — Y998 Other external cause status: Secondary | ICD-10-CM | POA: Insufficient documentation

## 2015-02-13 DIAGNOSIS — F319 Bipolar disorder, unspecified: Secondary | ICD-10-CM | POA: Diagnosis not present

## 2015-02-13 DIAGNOSIS — S8992XA Unspecified injury of left lower leg, initial encounter: Secondary | ICD-10-CM | POA: Diagnosis not present

## 2015-02-13 DIAGNOSIS — Z720 Tobacco use: Secondary | ICD-10-CM | POA: Diagnosis not present

## 2015-02-13 DIAGNOSIS — Z7952 Long term (current) use of systemic steroids: Secondary | ICD-10-CM | POA: Insufficient documentation

## 2015-02-13 DIAGNOSIS — R200 Anesthesia of skin: Secondary | ICD-10-CM | POA: Diagnosis not present

## 2015-02-13 DIAGNOSIS — I10 Essential (primary) hypertension: Secondary | ICD-10-CM | POA: Diagnosis not present

## 2015-02-13 MED ORDER — ACETAMINOPHEN 500 MG PO TABS: 500.0000 mg | ORAL_TABLET | Freq: Four times a day (QID) | ORAL | Status: DC | PRN

## 2015-02-13 NOTE — ED Notes (Signed)
Reached the rest home, they have no means to pick him up and request we send him back by ambulance
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
## 2015-02-13 NOTE — ED Notes (Signed)
Just brought pt a iced sprite and explained delay.  Await [facility] arrival
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
## 2015-02-13 NOTE — ED Notes (Signed)
Attempt to call [facility] for pick up multiple times

## 2015-02-13 NOTE — Discharge Instructions (Signed)

## 2015-04-04 DIAGNOSIS — F250 Schizoaffective disorder, bipolar type: Secondary | ICD-10-CM | POA: Diagnosis not present

## 2015-04-06 DIAGNOSIS — F250 Schizoaffective disorder, bipolar type: Secondary | ICD-10-CM | POA: Diagnosis not present

## 2015-05-03 ENCOUNTER — Emergency Department (HOSPITAL_COMMUNITY)
Admission: EM | Admit: 2015-05-03 | Discharge: 2015-05-03 | Disposition: A | Discharge status: Home / Self Care | Payer: Medicare Other | Attending: Emergency Medicine | Admitting: Emergency Medicine

## 2015-05-03 ENCOUNTER — Encounter (HOSPITAL_COMMUNITY): Payer: Self-pay | Admitting: *Deleted

## 2015-05-03 DIAGNOSIS — Z8619 Personal history of other infectious and parasitic diseases: Secondary | ICD-10-CM | POA: Diagnosis not present

## 2015-05-03 DIAGNOSIS — Z720 Tobacco use: Secondary | ICD-10-CM | POA: Diagnosis not present

## 2015-05-03 DIAGNOSIS — M25562 Pain in left knee: Secondary | ICD-10-CM | POA: Insufficient documentation

## 2015-05-03 DIAGNOSIS — I10 Essential (primary) hypertension: Secondary | ICD-10-CM | POA: Diagnosis not present

## 2015-05-03 DIAGNOSIS — Z79899 Other long term (current) drug therapy: Secondary | ICD-10-CM | POA: Diagnosis not present

## 2015-05-03 DIAGNOSIS — F329 Major depressive disorder, single episode, unspecified: Secondary | ICD-10-CM | POA: Insufficient documentation
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
## 2015-05-03 NOTE — ED Notes (Signed)
PT reports pain to RT knee . Pt wearing a knee sleave on arrival to room. Pt unable to report when knee started to hurt.

## 2015-05-03 NOTE — ED Notes (Signed)
PT was seen 02-13-15 for same k nee pain.

## 2015-05-03 NOTE — ED Notes (Signed)
Declined W/C at D/C and was escorted to lobby by RN. 

## 2015-05-03 NOTE — Discharge Instructions (Signed)
Take tylenol or motrin for pain Arthralgia Your caregiver has diagnosed you as suffering from an arthralgia. Arthralgia means there is pain in a joint. This can come from many reasons including:  Bruising the joint which causes soreness (inflammation) in the joint.  Wear and tear on the joints which occur as we grow older (osteoarthritis).  Overusing the joint.  Various forms of arthritis.  Infections of the joint. Regardless of the cause of pain in your joint, most of these different pains respond to anti-inflammatory drugs and rest. The exception to this is when a joint is infected, and these cases are treated with antibiotics, if it is a bacterial infection. HOME CARE INSTRUCTIONS   Rest the injured area for as long as directed by your caregiver. Then slowly start using the joint as directed by your caregiver and as the pain allows. Crutches as directed may be useful if the ankles, knees or hips are involved. If the knee was splinted or casted, continue use and care as directed. If an stretchy or elastic wrapping bandage has been applied today, it should be removed and re-applied every 3 to 4 hours. It should not be applied tightly, but firmly enough to keep swelling down. Watch toes and feet for swelling, bluish discoloration, coldness, numbness or excessive pain. If any of these problems (symptoms) occur, remove the ace bandage and re-apply more loosely. If these symptoms persist, contact your caregiver or return to this location.  For the first 24 hours, keep the injured extremity elevated on pillows while lying down.  Apply ice for 15-20 minutes to the sore joint every couple hours while awake for the first half day. Then 03-04 times per day for the first 48 hours. Put the ice in a plastic bag and place a towel between the bag of ice and your skin.  Wear any splinting, casting, elastic bandage applications, or slings as instructed.  Only take over-the-counter or prescription medicines  for pain, discomfort, or fever as directed by your caregiver. Do not use aspirin immediately after the injury unless instructed by your physician. Aspirin can cause increased bleeding and bruising of the tissues.  If you were given crutches, continue to use them as instructed and do not resume weight bearing on the sore joint until instructed. Persistent pain and inability to use the sore joint as directed for more than 2 to 3 days are warning signs indicating that you should see a caregiver for a follow-up visit as soon as possible. Initially, a hairline fracture (break in bone) may not be evident on X-rays. Persistent pain and swelling indicate that further evaluation, non-weight bearing or use of the joint (use of crutches or slings as instructed), or further X-rays are indicated. X-rays may sometimes not show a small fracture until a week or 10 days later. Make a follow-up appointment with your own caregiver or one to whom we have referred you. A radiologist (specialist in reading X-rays) may read your X-rays. Make sure you know how you are to obtain your X-ray results. Do not assume everything is normal if you do not hear from [place]. SEEK MEDICAL CARE IF: Bruising, swelling, or pain increases. SEEK IMMEDIATE MEDICAL CARE IF:   Your fingers or toes are numb or blue.  The pain is not responding to medications and continues to stay the same or get worse.  The pain in your joint becomes severe.  You develop a fever over 102 F (38.9 C).  It becomes impossible to move or use  the joint. MAKE SURE YOU:   Understand these instructions.  Will watch your condition.  Will get help right away if you are not doing well or get worse. Document Released: 08/20/2005 Document Revised: 11/12/2011 Document Reviewed: 04/07/2008 [facility] Patient Information 2015 [place], [place]. This information is not intended to replace advice given to you by your health care provider. Make sure you discuss any questions you  have with your health care provider.

## 2015-05-13 ENCOUNTER — Encounter (HOSPITAL_COMMUNITY): Payer: Self-pay | Admitting: Emergency Medicine

## 2015-05-13 ENCOUNTER — Emergency Department (HOSPITAL_COMMUNITY): Payer: Medicare Other

## 2015-05-13 ENCOUNTER — Emergency Department (HOSPITAL_COMMUNITY)
Admission: EM | Admit: 2015-05-13 | Discharge: 2015-05-14 | Disposition: A | Discharge status: Home / Self Care | Payer: Medicare Other | Attending: Emergency Medicine | Admitting: Emergency Medicine

## 2015-05-13 DIAGNOSIS — I10 Essential (primary) hypertension: Secondary | ICD-10-CM | POA: Diagnosis not present

## 2015-05-13 DIAGNOSIS — Z720 Tobacco use: Secondary | ICD-10-CM | POA: Diagnosis not present

## 2015-05-13 DIAGNOSIS — Y998 Other external cause status: Secondary | ICD-10-CM | POA: Insufficient documentation

## 2015-05-13 DIAGNOSIS — Z043 Encounter for examination and observation following other accident: Secondary | ICD-10-CM | POA: Insufficient documentation

## 2015-05-13 DIAGNOSIS — W19XXXA Unspecified fall, initial encounter: Secondary | ICD-10-CM

## 2015-05-13 DIAGNOSIS — Y9389 Activity, other specified: Secondary | ICD-10-CM | POA: Diagnosis not present

## 2015-05-13 DIAGNOSIS — Z8619 Personal history of other infectious and parasitic diseases: Secondary | ICD-10-CM | POA: Diagnosis not present

## 2015-05-13 DIAGNOSIS — Z7982 Long term (current) use of aspirin: Secondary | ICD-10-CM | POA: Diagnosis not present

## 2015-05-13 DIAGNOSIS — F99 Mental disorder, not otherwise specified: Secondary | ICD-10-CM | POA: Diagnosis not present

## 2015-05-13 DIAGNOSIS — G40909 Epilepsy, unspecified, not intractable, without status epilepticus: Secondary | ICD-10-CM | POA: Diagnosis not present

## 2015-05-13 DIAGNOSIS — R42 Dizziness and giddiness: Secondary | ICD-10-CM

## 2015-05-13 DIAGNOSIS — R109 Unspecified abdominal pain: Secondary | ICD-10-CM | POA: Insufficient documentation

## 2015-05-13 DIAGNOSIS — Y92192 Bathroom in other specified residential institution as the place of occurrence of the external cause: Secondary | ICD-10-CM | POA: Diagnosis not present

## 2015-05-13 DIAGNOSIS — F29 Unspecified psychosis not due to a substance or known physiological condition: Secondary | ICD-10-CM | POA: Diagnosis not present

## 2015-05-13 DIAGNOSIS — Z79899 Other long term (current) drug therapy: Secondary | ICD-10-CM | POA: Insufficient documentation

## 2015-05-13 DIAGNOSIS — S0990XA Unspecified injury of head, initial encounter: Secondary | ICD-10-CM | POA: Diagnosis not present

## 2015-05-13 DIAGNOSIS — W01198A Fall on same level from slipping, tripping and stumbling with subsequent striking against other object, initial encounter: Secondary | ICD-10-CM | POA: Diagnosis not present

## 2015-05-13 DIAGNOSIS — S199XXA Unspecified injury of neck, initial encounter: Secondary | ICD-10-CM | POA: Diagnosis not present

## 2015-05-13 NOTE — ED Notes (Signed)
Pt ambulatory to exam room with steady gait.  

## 2015-05-13 NOTE — ED Notes (Signed)
Last VS: 110/88, 64hr, 18resp

## 2015-05-13 NOTE — ED Notes (Signed)
Pt presents from a group home via EMS for fall. EMS reports patient 12 lead unremarkable, denies head or neck pain, ambulatory to triage. Expressing thoughts of depression.

## 2015-05-13 NOTE — ED Provider Notes (Signed)
CSN: [id number]   Arrival date & time 05/13/15 2132  History  This chart was scribed for Merryl Hacker, MD by Altamease Oiler, ED Scribe. This patient was seen in room [facility] and the patient's care was started at 11:38 PM.  Chief Complaint  Patient presents with  . Fall  . Medical Clearance    HPI The history is provided by the patient. No language interpreter was used.   Brought in by EMS from a group home, Daryl Campbell is a 64 y.o. male with PMHx of schizophrenia, schizoaffective disorder, bipolar affective disorder, depression, and seizures who presents to the Emergency Department complaining of a fall tonight. Pt states that he was dizzy and fell in the bathroom striking his head on the sink. Denies syncope or loss of consciousness. After the fall he got up and was ambulatory down the hall.    He is no longer dizzy and has been ambulatory since the fall. Also complains of heartburn for 1 week. He describes the sensation of burning in his abdomen and chest. He notes feeling depressed earlier but not presently. Pt denies LOC, SI, and HI. Currently he is without complaint.  Past Medical History  Diagnosis Date  . Hypertension   . Psychiatric disorder   . Hepatitis C   . Seizures   . Schizophrenia   . Bipolar affective   . Depression   . Schizoaffective disorder     History reviewed. No pertinent past surgical history.  No family history on file.  Social History  Substance Use Topics  . Smoking status: Current Every Day Smoker -- 1.00 packs/day for 3 years    Types: Cigarettes  . Smokeless tobacco: Never Used  . Alcohol Use: No     Review of Systems  Constitutional: Negative.  Negative for fever.  Respiratory: Negative.  Negative for chest tightness and shortness of breath.   Cardiovascular: Negative.  Negative for chest pain.  Gastrointestinal: Positive for abdominal pain. Negative for nausea, vomiting and diarrhea.  Genitourinary: Negative.  Negative for dysuria.   Musculoskeletal: Negative for neck pain.  Neurological: Positive for dizziness. Negative for headaches.  All other systems reviewed and are negative.  Home Medications   Prior to Admission medications   Medication Sig Start Date End Date Taking? Authorizing Provider  aspirin EC 81 MG tablet Take 81 mg by mouth daily.   Yes Historical Provider, MD  benztropine (COGENTIN) 0.5 MG tablet Take 0.5 mg by mouth 2 (two) times daily.    Yes Historical Provider, MD  haloperidol (HALDOL) 2 MG tablet Take 2 mg by mouth at bedtime.   Yes Historical Provider, MD  lithium carbonate (LITHOBID) 300 MG CR tablet Take 300 mg by mouth 2 (two) times daily.   Yes Historical Provider, MD  traZODone (DESYREL) 50 MG tablet Take 50 mg by mouth at bedtime.   Yes Historical Provider, MD  acetaminophen (TYLENOL) 500 MG tablet Take 1 tablet (500 mg total) by mouth every 6 (six) hours as needed. Patient not taking: Reported on 05/13/2015 02/13/15   Margarita Mail, PA-C  clotrimazole (LOTRIMIN) 1 % cream Apply to affected area 2 times daily Patient not taking: Reported on 05/13/2015 10/28/14   Larene Pickett, PA-C  hydrocortisone cream 1 % Apply to affected area 2 times daily Patient not taking: Reported on 05/13/2015 07/06/14   Alvina Chou, PA-C  methocarbamol (ROBAXIN) 500 MG tablet Take 1 tablet (500 mg total) by mouth 2 (two) times daily. Patient not taking: Reported on  05/13/2015 07/06/14   Alvina Chou, PA-C  permethrin (ELIMITE) 5 % cream Apply to entire body other than face - let sit for 14 hours then wash off, may repeat in 1 week if still having symptoms Patient not taking: Reported on 05/13/2015 10/28/14   Larene Pickett, PA-C    Allergies  Review of patient's allergies indicates no known allergies.  Triage Vitals: BP 150/78 mmHg  Pulse 77  Temp(Src) 98.3 F (36.8 C) (Oral)  Resp 20  SpO2 98%  Physical Exam  Constitutional: He is oriented to person, place, and time. No distress.  HENT:  Head:  Normocephalic and atraumatic.  Eyes: Pupils are equal, round, and reactive to light.  Neck:  No midline C-spine tenderness, step-off or deformity  Cardiovascular: Normal rate, regular rhythm and normal heart sounds.   No murmur heard. Pulmonary/Chest: Effort normal and breath sounds normal. No respiratory distress. He has no wheezes.  Abdominal: Soft. Bowel sounds are normal. There is no tenderness. There is no rebound.  Musculoskeletal: He exhibits no edema.  Lymphadenopathy:    He has no cervical adenopathy.  Neurological: He is alert and oriented to person, place, and time.  Cranial nerves II through XII intact, no dysmetria to finger-nose-finger, 5 out of 5 strength in all 4 extremities  Skin: Skin is warm and dry.  Psychiatric:  Strange affect  Nursing note and vitals reviewed.   ED Course  Procedures   DIAGNOSTIC STUDIES: Oxygen Saturation is 98% on RA, normal by my interpretation.    COORDINATION OF CARE: 11:42 PM Discussed treatment plan which includes lab work, CT head without contrast, CT cervical spine without contrast, and EKG with pt at bedside and pt agreed to plan.  Labs Reviewed  CBC WITH DIFFERENTIAL/PLATELET - Abnormal; Notable for the following:    RBC 4.14 (*)    HCT 37.8 (*)    All other components within normal limits  BASIC METABOLIC PANEL - Abnormal; Notable for the following:    Glucose, Bld 113 (*)    All other components within normal limits  URINALYSIS, ROUTINE W REFLEX MICROSCOPIC (NOT AT [facility]) - Abnormal; Notable for the following:    APPearance CLOUDY (*)    All other components within normal limits  TROPONIN I    Imaging Review Ct Head Wo Contrast  05/14/2015   CLINICAL DATA:  64 year old male with fall  EXAM: CT HEAD WITHOUT CONTRAST  CT CERVICAL SPINE WITHOUT CONTRAST  TECHNIQUE: Multidetector CT imaging of the head and cervical spine was performed following the standard protocol without intravenous contrast. Multiplanar CT image  reconstructions of the cervical spine were also generated.  COMPARISON:  CT dated 04/19/2015  FINDINGS: CT HEAD FINDINGS  The ventricles and sulci are appropriate in size for patient's age. New Minimal periventricular and deep white matter hypodensities represent chronic microvascular ischemic changes. There is no intracranial hemorrhage. No mass effect or midline shift identified.  The visualized paranasal sinuses and mastoid air cells are well aerated. The calvarium is intact.  CT CERVICAL SPINE FINDINGS  There is no acute fracture or subluxation of the cervical spine.There is multilevel degenerative changes most prominent at C3-C7. There is mild reversal of low wall cervical lordosis at this level.The odontoid and spinous processes are intact.There is normal anatomic alignment of the C1-C2 lateral masses. The visualized soft tissues appear unremarkable.  Bilateral carotid bulb calcified plaque noted.  IMPRESSION: No acute intracranial pathology.  Mild age-related atrophy and chronic microvascular ischemic disease.  Multilevel degenerative changes of  the cervical spine. No acute fracture or subluxation.   Electronically Signed   By: Anner Crete M.D.   On: 05/14/2015 00:25   Ct Cervical Spine Wo Contrast  05/14/2015   CLINICAL DATA:  64 year old male with fall  EXAM: CT HEAD WITHOUT CONTRAST  CT CERVICAL SPINE WITHOUT CONTRAST  TECHNIQUE: Multidetector CT imaging of the head and cervical spine was performed following the standard protocol without intravenous contrast. Multiplanar CT image reconstructions of the cervical spine were also generated.  COMPARISON:  CT dated 04/19/2015  FINDINGS: CT HEAD FINDINGS  The ventricles and sulci are appropriate in size for patient's age. New Minimal periventricular and deep white matter hypodensities represent chronic microvascular ischemic changes. There is no intracranial hemorrhage. No mass effect or midline shift identified.  The visualized paranasal sinuses and  mastoid air cells are well aerated. The calvarium is intact.  CT CERVICAL SPINE FINDINGS  There is no acute fracture or subluxation of the cervical spine.There is multilevel degenerative changes most prominent at C3-C7. There is mild reversal of low wall cervical lordosis at this level.The odontoid and spinous processes are intact.There is normal anatomic alignment of the C1-C2 lateral masses. The visualized soft tissues appear unremarkable.  Bilateral carotid bulb calcified plaque noted.  IMPRESSION: No acute intracranial pathology.  Mild age-related atrophy and chronic microvascular ischemic disease.  Multilevel degenerative changes of the cervical spine. No acute fracture or subluxation.   Electronically Signed   By: Anner Crete M.D.   On: 05/14/2015 00:25    EKG Interpretation  Date/Time:  Saturday May 14 2015 00:07:19 EDT Ventricular Rate:  60 PR Interval:  230 QRS Duration: 95 QT Interval:  470 QTC Calculation: 470 R Axis:   57 Text Interpretation:  Sinus rhythm Prolonged PR interval Nonspecific T abnormalities, anterior leads Confirmed by Tereza Gilham  MD, Zanai Mallari ([id number]) on 05/14/2015 1:01:33 AM  MDM   Final diagnoses:  Fall, initial encounter  Dizziness    Patient presents following a fall. Reports that he got dizzy.  Nontoxic and nonfocal on exam. Vital signs are reassuring. No evidence of cerebellar dysfunction. Basic labwork obtained and reassuring. Imaging of the head negative. EKG is nonischemic without evidence of arrhythmia.  Troponin is negative. Patient is not orthostatic. He is ambulatory in the hallway without difficulty. Reports that he continues to be asymptomatic. At this time there does not appear to be any acute emergent condition, will discharge patient home. He was given strict return cautions.  After history, exam, and medical workup I feel the patient has been appropriately medically screened and is safe for discharge home. Pertinent diagnoses were discussed  with the patient. Patient was given return precautions.  I personally performed the services described in this documentation, which was scribed in my presence. The recorded information has been reviewed and is accurate.    Merryl Hacker, MD 05/14/15 [id number]

## 2015-05-14 DIAGNOSIS — S199XXA Unspecified injury of neck, initial encounter: Secondary | ICD-10-CM | POA: Diagnosis not present

## 2015-05-14 DIAGNOSIS — S0990XA Unspecified injury of head, initial encounter: Secondary | ICD-10-CM | POA: Diagnosis not present

## 2015-05-14 LAB — TROPONIN I: Troponin I: <0.03 ng/mL (ref <0.031)

## 2015-05-14 LAB — BASIC METABOLIC PANEL
Anion gap: 5 (ref 5–15)
BUN: 10 mg/dL (ref 6–20)
CALCIUM: 9.3 mg/dL (ref 8.9–10.3)
CO2: 26 mmol/L (ref 22–32)
Chloride: 109 mmol/L (ref 101–111)
Creatinine, Ser: 1.02 mg/dL (ref 0.61–1.24)
Glucose, Bld: 113 mg/dL — ABNORMAL HIGH (ref 65–99)
Potassium: 3.7 mmol/L (ref 3.5–5.1)
SODIUM: 140 mmol/L (ref 135–145)

## 2015-05-14 LAB — CBC WITH DIFFERENTIAL/PLATELET
BASOS PCT: 0 % (ref 0–1)
Basophils Absolute: 0 10*3/uL (ref 0.0–0.1)
EOS ABS: 0.2 10*3/uL (ref 0.0–0.7)
EOS PCT: 3 % (ref 0–5)
HCT: 37.8 % — ABNORMAL LOW (ref 39.0–52.0)
HEMOGLOBIN: 13 g/dL (ref 13.0–17.0)
Lymphocytes Relative: 30 % (ref 12–46)
Lymphs Abs: 1.6 10*3/uL (ref 0.7–4.0)
MCH: 31.4 pg (ref 26.0–34.0)
MCHC: 34.4 g/dL (ref 30.0–36.0)
MCV: 91.3 fL (ref 78.0–100.0)
MONO ABS: 0.5 10*3/uL (ref 0.1–1.0)
MONOS PCT: 10 % (ref 3–12)
NEUTROS PCT: 57 % (ref 43–77)
Neutro Abs: 3 10*3/uL (ref 1.7–7.7)
PLATELETS: 177 10*3/uL (ref 150–400)
RBC: 4.14 MIL/uL — ABNORMAL LOW (ref 4.22–5.81)
RDW: 12.7 % (ref 11.5–15.5)
WBC: 5.3 10*3/uL (ref 4.0–10.5)

## 2015-05-14 LAB — URINALYSIS, ROUTINE W REFLEX MICROSCOPIC
BILIRUBIN URINE: NEGATIVE
Glucose, UA: NEGATIVE mg/dL
HGB URINE DIPSTICK: NEGATIVE
Ketones, ur: NEGATIVE mg/dL
Leukocytes, UA: NEGATIVE
Nitrite: NEGATIVE
PROTEIN: NEGATIVE mg/dL
Specific Gravity, Urine: 1.015 (ref 1.005–1.030)
UROBILINOGEN UA: 1 mg/dL (ref 0.0–1.0)
pH: 6.5 (ref 5.0–8.0)

## 2015-05-14 NOTE — Discharge Instructions (Signed)
You were seen today for dizziness and a fall. Her workup is reassuring. No evidence of head injury. You need to make sure that you maintain good hydration and take precautions at home to prevent falls.  Dizziness Dizziness is a common problem. It is a feeling of unsteadiness or light-headedness. You may feel like you are about to faint. Dizziness can lead to injury if you stumble or fall. A person of any age group can suffer from dizziness, but dizziness is more common in older adults. CAUSES  Dizziness can be caused by many different things, including:  Middle ear problems.  Standing for too long.  Infections.  An allergic reaction.  Aging.  An emotional response to something, such as the sight of blood.  Side effects of medicines.  Tiredness.  Problems with circulation or blood pressure.  Excessive use of alcohol or medicines, or illegal drug use.  Breathing too fast (hyperventilation).  An irregular heart rhythm (arrhythmia).  A low red blood cell count (anemia).  Pregnancy.  Vomiting, diarrhea, fever, or other illnesses that cause body fluid loss (dehydration).  Diseases or conditions such as Parkinson's disease, high blood pressure (hypertension), diabetes, and thyroid problems.  Exposure to extreme heat. DIAGNOSIS  Your health care provider will ask about your symptoms, perform a physical exam, and perform an electrocardiogram (ECG) to record the electrical activity of your heart. Your health care provider may also perform other heart or blood tests to determine the cause of your dizziness. These may include:  Transthoracic echocardiogram (TTE). During echocardiography, sound waves are used to evaluate how blood flows through your heart.  Transesophageal echocardiogram (TEE).  Cardiac monitoring. This allows your health care provider to monitor your heart rate and rhythm in real time.  Holter monitor. This is a portable device that records your heartbeat and can  help diagnose heart arrhythmias. It allows your health care provider to track your heart activity for several days if needed.  Stress tests by exercise or by giving medicine that makes the heart beat faster. TREATMENT  Treatment of dizziness depends on the cause of your symptoms and can vary greatly. HOME CARE INSTRUCTIONS   Drink enough fluids to keep your urine clear or pale yellow. This is especially important in very hot weather. In older adults, it is also important in cold weather.  Take your medicine exactly as directed if your dizziness is caused by medicines. When taking blood pressure medicines, it is especially important to get up slowly.  Rise slowly from chairs and steady yourself until you feel okay.  In the morning, first sit up on the side of the bed. When you feel okay, stand slowly while holding onto something until you know your balance is fine.  Move your legs often if you need to stand in one place for a long time. Tighten and relax your muscles in your legs while standing.  Have someone stay with you for 1-2 days if dizziness continues to be a problem. Do this until you feel you are well enough to stay alone. Have the person call your health care provider if he or she notices changes in you that are concerning.  Do not drive or use heavy machinery if you feel dizzy.  Do not drink alcohol. SEEK IMMEDIATE MEDICAL CARE IF:   Your dizziness or light-headedness gets worse.  You feel nauseous or vomit.  You have problems talking, walking, or using your arms, hands, or legs.  You feel weak.  You are not thinking  clearly or you have trouble forming sentences. It may take a friend or family member to notice this.  You have chest pain, abdominal pain, shortness of breath, or sweating.  Your vision changes.  You notice any bleeding.  You have side effects from medicine that seems to be getting worse rather than better. MAKE SURE YOU:   Understand these  instructions.  Will watch your condition.  Will get help right away if you are not doing well or get worse. Document Released: 02/13/2001 Document Revised: 08/25/2013 Document Reviewed: 03/09/2011 [facility] Patient Information 2015 [place], [place]. This information is not intended to replace advice given to you by your health care provider. Make sure you discuss any questions you have with your health care provider.

## 2015-05-14 NOTE — ED Notes (Signed)
Pt ambulated in hall, no assist

## 2015-06-07 DIAGNOSIS — F250 Schizoaffective disorder, bipolar type: Secondary | ICD-10-CM | POA: Diagnosis not present

## 2015-06-24 DIAGNOSIS — F250 Schizoaffective disorder, bipolar type: Secondary | ICD-10-CM | POA: Diagnosis not present

## 2015-07-21 DIAGNOSIS — F250 Schizoaffective disorder, bipolar type: Secondary | ICD-10-CM | POA: Diagnosis not present

## 2015-07-25 DIAGNOSIS — M109 Gout, unspecified: Secondary | ICD-10-CM | POA: Diagnosis not present

## 2015-08-02 DIAGNOSIS — F250 Schizoaffective disorder, bipolar type: Secondary | ICD-10-CM | POA: Diagnosis not present

## 2015-08-18 DIAGNOSIS — F250 Schizoaffective disorder, bipolar type: Secondary | ICD-10-CM | POA: Diagnosis not present

## 2015-10-13 DIAGNOSIS — F250 Schizoaffective disorder, bipolar type: Secondary | ICD-10-CM | POA: Diagnosis not present

## 2015-10-21 ENCOUNTER — Encounter (HOSPITAL_COMMUNITY): Payer: Self-pay | Admitting: Emergency Medicine

## 2015-10-21 ENCOUNTER — Emergency Department (HOSPITAL_COMMUNITY)
Admission: EM | Admit: 2015-10-21 | Discharge: 2015-10-21 | Disposition: A | Discharge status: Home / Self Care | Payer: Medicare Other | Attending: Emergency Medicine | Admitting: Emergency Medicine

## 2015-10-21 ENCOUNTER — Emergency Department (HOSPITAL_COMMUNITY): Payer: Medicare Other

## 2015-10-21 DIAGNOSIS — F319 Bipolar disorder, unspecified: Secondary | ICD-10-CM | POA: Diagnosis not present

## 2015-10-21 DIAGNOSIS — F209 Schizophrenia, unspecified: Secondary | ICD-10-CM | POA: Diagnosis not present

## 2015-10-21 DIAGNOSIS — R45851 Suicidal ideations: Secondary | ICD-10-CM | POA: Diagnosis not present

## 2015-10-21 DIAGNOSIS — Z79899 Other long term (current) drug therapy: Secondary | ICD-10-CM | POA: Diagnosis not present

## 2015-10-21 DIAGNOSIS — Z8619 Personal history of other infectious and parasitic diseases: Secondary | ICD-10-CM | POA: Diagnosis not present

## 2015-10-21 DIAGNOSIS — R072 Precordial pain: Secondary | ICD-10-CM | POA: Diagnosis not present

## 2015-10-21 DIAGNOSIS — Z7982 Long term (current) use of aspirin: Secondary | ICD-10-CM | POA: Diagnosis not present

## 2015-10-21 DIAGNOSIS — Z9119 Patient's noncompliance with other medical treatment and regimen: Secondary | ICD-10-CM | POA: Diagnosis not present

## 2015-10-21 DIAGNOSIS — F17210 Nicotine dependence, cigarettes, uncomplicated: Secondary | ICD-10-CM | POA: Insufficient documentation

## 2015-10-21 DIAGNOSIS — R079 Chest pain, unspecified: Secondary | ICD-10-CM | POA: Insufficient documentation

## 2015-10-21 DIAGNOSIS — Z9114 Patient's other noncompliance with medication regimen: Secondary | ICD-10-CM

## 2015-10-21 DIAGNOSIS — F419 Anxiety disorder, unspecified: Secondary | ICD-10-CM | POA: Diagnosis not present

## 2015-10-21 DIAGNOSIS — I10 Essential (primary) hypertension: Secondary | ICD-10-CM | POA: Diagnosis not present

## 2015-10-21 DIAGNOSIS — F1910 Other psychoactive substance abuse, uncomplicated: Secondary | ICD-10-CM | POA: Diagnosis not present

## 2015-10-21 LAB — CBC
HEMATOCRIT: 41.9 % (ref 39.0–52.0)
HEMOGLOBIN: 14.3 g/dL (ref 13.0–17.0)
MCH: 30.8 pg (ref 26.0–34.0)
MCHC: 34.1 g/dL (ref 30.0–36.0)
MCV: 90.1 fL (ref 78.0–100.0)
Platelets: 212 10*3/uL (ref 150–400)
RBC: 4.65 MIL/uL (ref 4.22–5.81)
RDW: 12.1 % (ref 11.5–15.5)
WBC: 3.9 10*3/uL — ABNORMAL LOW (ref 4.0–10.5)

## 2015-10-21 LAB — RAPID URINE DRUG SCREEN, HOSP PERFORMED
AMPHETAMINES: NOT DETECTED
Barbiturates: NOT DETECTED
Benzodiazepines: NOT DETECTED
COCAINE: NOT DETECTED
OPIATES: NOT DETECTED
Tetrahydrocannabinol: NOT DETECTED

## 2015-10-21 LAB — BASIC METABOLIC PANEL
ANION GAP: 11 (ref 5–15)
BUN: 9 mg/dL (ref 6–20)
CALCIUM: 9.3 mg/dL (ref 8.9–10.3)
CHLORIDE: 110 mmol/L (ref 101–111)
CO2: 22 mmol/L (ref 22–32)
Creatinine, Ser: 1.03 mg/dL (ref 0.61–1.24)
GFR calc non Af Amer: >60 mL/min (ref >60)
Glucose, Bld: 117 mg/dL — ABNORMAL HIGH (ref 65–99)
POTASSIUM: 3.9 mmol/L (ref 3.5–5.1)
Sodium: 143 mmol/L (ref 135–145)

## 2015-10-21 LAB — SALICYLATE LEVEL

## 2015-10-21 LAB — TROPONIN I

## 2015-10-21 LAB — I-STAT TROPONIN, ED: TROPONIN I, POC: 0 ng/mL (ref 0.00–0.08)

## 2015-10-21 LAB — LITHIUM LEVEL: Lithium Lvl: 0.13 mmol/L — ABNORMAL LOW (ref 0.60–1.20)

## 2015-10-21 LAB — ACETAMINOPHEN LEVEL

## 2015-10-21 LAB — ETHANOL: Alcohol, Ethyl (B): <5 mg/dL (ref <5)

## 2015-10-21 MED ORDER — ALUM & MAG HYDROXIDE-SIMETH 200-200-20 MG/5ML PO SUSP: 30.0000 mL | ORAL | Status: DC | PRN

## 2015-10-21 MED ORDER — ONDANSETRON HCL 4 MG PO TABS: 4.0000 mg | ORAL_TABLET | Freq: Three times a day (TID) | ORAL | Status: DC | PRN

## 2015-10-21 MED ORDER — ACETAMINOPHEN 325 MG PO TABS: 650.0000 mg | ORAL_TABLET | ORAL | Status: DC | PRN

## 2015-10-21 MED ORDER — ZOLPIDEM TARTRATE 5 MG PO TABS: 5.0000 mg | ORAL_TABLET | Freq: Every evening | ORAL | Status: DC | PRN

## 2015-10-21 MED ORDER — BENZTROPINE MESYLATE 1 MG PO TABS: 0.5000 mg | ORAL_TABLET | Freq: Two times a day (BID) | ORAL | Status: DC

## 2015-10-21 MED ORDER — NICOTINE 21 MG/24HR TD PT24: 21.0000 mg | MEDICATED_PATCH | Freq: Every day | TRANSDERMAL | Status: DC | PRN

## 2015-10-21 MED ORDER — IBUPROFEN 400 MG PO TABS: 600.0000 mg | ORAL_TABLET | Freq: Three times a day (TID) | ORAL | Status: DC | PRN

## 2015-10-21 MED ORDER — TRAZODONE HCL 50 MG PO TABS: 50.0000 mg | ORAL_TABLET | Freq: Every day | ORAL | Status: DC

## 2015-10-21 MED ORDER — LITHIUM CARBONATE ER 300 MG PO TBCR
300.0000 mg | EXTENDED_RELEASE_TABLET | Freq: Two times a day (BID) | ORAL | Status: DC
  Filled 2015-10-21: qty 1

## 2015-10-21 MED ORDER — HALOPERIDOL 2 MG PO TABS
2.0000 mg | ORAL_TABLET | Freq: Every day | ORAL | Status: DC
Start: 2015-10-21 — End: 2015-10-21

## 2015-10-21 NOTE — Discharge Instructions (Signed)
Nonspecific Chest Pain  °Chest pain can be caused by many different conditions. There is always a chance that your pain could be related to something serious, such as a heart attack or a blood clot in your lungs. Chest pain can also be caused by conditions that are not life-threatening. If you have chest pain, it is very important to follow up with your health care provider. °CAUSES  °Chest pain can be caused by: °· Heartburn. °· Pneumonia or bronchitis. °· Anxiety or stress. °· Inflammation around your heart (pericarditis) or lung (pleuritis or pleurisy). °· A blood clot in your lung. °· A collapsed lung (pneumothorax). It can develop suddenly on its own (spontaneous pneumothorax) or from trauma to the chest. °· Shingles infection (varicella-zoster virus). °· Heart attack. °· Damage to the bones, muscles, and cartilage that make up your chest wall. This can include: °¨ Bruised bones due to injury. °¨ Strained muscles or cartilage due to frequent or repeated coughing or overwork. °¨ Fracture to one or more ribs. °¨ Sore cartilage due to inflammation (costochondritis). °RISK FACTORS  °Risk factors for chest pain may include: °· Activities that increase your risk for trauma or injury to your chest. °· Respiratory infections or conditions that cause frequent coughing. °· Medical conditions or overeating that can cause heartburn. °· Heart disease or family history of heart disease. °· Conditions or health behaviors that increase your risk of developing a blood clot. °· Having had chicken pox (varicella zoster). °SIGNS AND SYMPTOMS °Chest pain can feel like: °· Burning or tingling on the surface of your chest or deep in your chest. °· Crushing, pressure, aching, or squeezing pain. °· Dull or sharp pain that is worse when you move, cough, or take a deep breath. °· Pain that is also felt in your back, neck, shoulder, or arm, or pain that spreads to any of these areas. °Your chest pain may come and go, or it may stay  constant. °DIAGNOSIS °Lab tests or other studies may be needed to find the cause of your pain. Your health care provider may have you take a test called an ambulatory ECG (electrocardiogram). An ECG records your heartbeat patterns at the time the test is performed. You may also have other tests, such as: °· Transthoracic echocardiogram (TTE). During echocardiography, sound waves are used to create a picture of all of the heart structures and to look at how blood flows through your heart. °· Transesophageal echocardiogram (TEE). This is a more advanced imaging test that obtains images from inside your body. It allows your health care provider to see your heart in finer detail. °· Cardiac monitoring. This allows your health care provider to monitor your heart rate and rhythm in real time. °· Holter monitor. This is a portable device that records your heartbeat and can help to diagnose abnormal heartbeats. It allows your health care provider to track your heart activity for several days, if needed. °· Stress tests. These can be done through exercise or by taking medicine that makes your heart beat more quickly. °· Blood tests. °· Imaging tests. °TREATMENT  °Your treatment depends on what is causing your chest pain. Treatment may include: °· Medicines. These may include: °¨ Acid blockers for heartburn. °¨ Anti-inflammatory medicine. °¨ Pain medicine for inflammatory conditions. °¨ Antibiotic medicine, if an infection is present. °¨ Medicines to dissolve blood clots. °¨ Medicines to treat coronary artery disease. °· Supportive care for conditions that do not require medicines. This may include: °¨ Resting. °¨ Applying heat   or cold packs to injured areas. °¨ Limiting activities until pain decreases. °HOME CARE INSTRUCTIONS °· If you were prescribed an antibiotic medicine, finish it all even if you start to feel better. °· Avoid any activities that bring on chest pain. °· Do not use any tobacco products, including  cigarettes, chewing tobacco, or electronic cigarettes. If you need help quitting, ask your health care provider. °· Do not drink alcohol. °· Take medicines only as directed by your health care provider. °· Keep all follow-up visits as directed by your health care provider. This is important. This includes any further testing if your chest pain does not go away. °· If heartburn is the cause for your chest pain, you may be told to keep your head raised (elevated) while sleeping. This reduces the chance that acid will go from your stomach into your esophagus. °· Make lifestyle changes as directed by your health care provider. These may include: °¨ Getting regular exercise. Ask your health care provider to suggest some activities that are safe for you. °¨ Eating a heart-healthy diet. A registered dietitian can help you to learn healthy eating options. °¨ Maintaining a healthy weight. °¨ Managing diabetes, if necessary. °¨ Reducing stress. °SEEK MEDICAL CARE IF: °· Your chest pain does not go away after treatment. °· You have a rash with blisters on your chest. °· You have a fever. °SEEK IMMEDIATE MEDICAL CARE IF:  °· Your chest pain is worse. °· You have an increasing cough, or you cough up blood. °· You have severe abdominal pain. °· You have severe weakness. °· You faint. °· You have chills. °· You have sudden, unexplained chest discomfort. °· You have sudden, unexplained discomfort in your arms, back, neck, or jaw. °· You have shortness of breath at any time. °· You suddenly start to sweat, or your skin gets clammy. °· You feel nauseous or you vomit. °· You suddenly feel light-headed or dizzy. °· Your heart begins to beat quickly, or it feels like it is skipping beats. °These symptoms may represent a serious problem that is an emergency. Do not wait to see if the symptoms will go away. Get medical help right away. Call your local emergency services (911 in the [place] Do not drive yourself to the hospital. °  °This  information is not intended to replace advice given to you by your health care provider. Make sure you discuss any questions you have with your health care provider. °  °Document Released: 05/30/2005 Document Revised: 09/10/2014 Document Reviewed: 03/26/2014 °[employer]vier Interactive Patient Education ©2016 [employer] ° °

## 2015-10-21 NOTE — ED Notes (Signed)
Pt director of his group home called to talk with pt.

## 2015-10-21 NOTE — BH Assessment (Addendum)
Tele Assessment Note   Daryl Campbell is a 65 year old white male that denies SI/HI/Psychosis.  Patient reports that he came to the ER because his chest was hurting and he thought that he was having a heart attack.   Patient reports that his father died of a heart attack.   Patient reports that [place]  Patient reports that he was diagnosed with Schizophrenia and he has been hearing voices for over 20 years.  Patient reports that he receives outpatient psychiatric medication management at mental health therapy.  Patient denies command hallucinations.    Writer obtained collateral information from the group home.  Per Vito Backers [phone]). The patient always talks about seeing and hearing God.  Patient has never been aggressive or acted out on any commands.    Patient denies physical, sexual or emotional abuse. Patient denies prior psychiatric hospitalizations.  Patient reports that he stopped sniffing glue three years ago.  Patient reports that he has a follow up appointment with his current Psychiatrist at [facility] on November 10, 2015.   Per Delphia Grates, NP - patient does not meet criteria for inpatient hospitalization.  Patient will return to his current group home.    Diagnosis: Schizophrenia   Past Medical History:  Past Medical History  Diagnosis Date  . Hypertension   . Psychiatric disorder   . Hepatitis C   . Seizures ([place])   . Schizophrenia ([place])   . Bipolar affective ([place])   . Depression   . Schizoaffective disorder ([place])     History reviewed. No pertinent past surgical history.  Family History: No family history on file.  Social History:  reports that he has been smoking Cigarettes.  He has a 3 pack-year smoking history. He has never used smokeless tobacco. He reports that he uses illicit drugs (Marijuana). He reports that he does not drink alcohol.  Additional Social History:  Alcohol / Drug Use History of alcohol / drug use?: No  history of alcohol / drug abuse  CIWA: CIWA-Ar BP: 131/82 mmHg Pulse Rate: 81 COWS:    PATIENT STRENGTHS: (choose at least two) Average or above average intelligence Communication skills Physical Health  Allergies: No Known Allergies  Home Medications:  (Not in a hospital admission)  OB/GYN Status:  No LMP for male patient.  General Assessment Data Location of Assessment: [facility] ED TTS Assessment: In system Is this a Tele or Face-to-Face Assessment?: Tele Assessment Is this an Initial Assessment or a Re-assessment for this encounter?: Initial Assessment Marital status: Single Maiden name: NA Is patient pregnant?: No Pregnancy Status: No Living Arrangements: Group Home ([facility]) Can pt return to current living arrangement?: Yes Admission Status: Voluntary Is patient capable of signing voluntary admission?: Yes Referral Source: Self/Family/Friend Insurance type: Medicare     Crisis Care Plan Living Arrangements: Group Home ([facility] [place] [facility]) Legal Guardian:  (NA) Name of Psychiatrist: [facility]  Name of Therapist: [facility]   Education Status Is patient currently in school?: No Current Grade: NA Highest grade of school patient has completed: NA Name of school: NA Contact person: NA  Risk to self with the past 6 months Suicidal Ideation: No Has patient been a risk to self within the past 6 months prior to admission? : No Suicidal Intent: No Has patient had any suicidal intent within the past 6 months prior to admission? : No Is patient at risk for suicide?: No Suicidal Plan?: No Has patient  had any suicidal plan within the past 6 months prior to admission? : No Access to Means: No What has been your use of drugs/alcohol within the last 12 months?: na Previous Attempts/Gestures: No How many times?: 0 Other Self Harm Risks: NA Triggers for Past Attempts:  (NA) Intentional Self Injurious Behavior: None Family Suicide History: No Recent  stressful life event(s):  (NA) Persecutory voices/beliefs?: Yes Depression: No Depression Symptoms:  (Patient deneis depression ) Substance abuse history and/or treatment for substance abuse?: No Suicide prevention information given to non-admitted patients: Not applicable  Risk to Others within the past 6 months Homicidal Ideation: No Does patient have any lifetime risk of violence toward others beyond the six months prior to admission? : No Thoughts of Harm to Others: No Current Homicidal Intent: No Current Homicidal Plan: No Access to Homicidal Means: No Identified Victim: None Reported History of harm to others?: No Assessment of Violence: None Noted Violent Behavior Description: None Reported Does patient have access to weapons?: No Criminal Charges Pending?: No Does patient have a court date: No Is patient on probation?: No  Psychosis Hallucinations: Auditory, Visual Delusions: None noted  Mental Status Report Appearance/Hygiene: In scrubs Eye Contact: Fair Motor Activity: Freedom of movement Speech: Logical/coherent Level of Consciousness: Alert Mood:  (None Reported) Affect: Appropriate to circumstance Anxiety Level: None Thought Processes: Coherent, Relevant Judgement: Unimpaired Orientation: Person, Place, Time, Situation Obsessive Compulsive Thoughts/Behaviors: None  Cognitive Functioning Concentration: Decreased Memory: Remote Intact, Recent Intact IQ: Average Insight: Good Impulse Control: Good Appetite: Good Weight Loss: 0 Weight Gain: 0 Sleep: No Change Total Hours of Sleep: 8 Vegetative Symptoms: None  ADLScreening [facility] Assessment Services) Patient's cognitive ability adequate to safely complete daily activities?: Yes Patient able to express need for assistance with ADLs?: Yes Independently performs ADLs?: Yes (appropriate for developmental age)  Prior Inpatient Therapy Prior Inpatient Therapy: No Prior Therapy Dates: NA Prior Therapy  Facilty/Provider(s): NA Reason for Treatment: NA  Prior Outpatient Therapy Prior Outpatient Therapy: Yes Prior Therapy Dates: [place]  Prior Therapy Facilty/Provider(s): [facility]  Reason for Treatment: Med Mgt and OPT Does patient have an ACCT team?: No Does patient have Intensive In-House Services?  : No Does patient have [facility] services? : No Does patient have P4CC services?: No  ADL Screening (condition at time of admission) Patient's cognitive ability adequate to safely complete daily activities?: Yes Is the patient deaf or have difficulty hearing?: No Does the patient have difficulty seeing, even when wearing glasses/contacts?: No Does the patient have difficulty concentrating, remembering, or making decisions?: No Patient able to express need for assistance with ADLs?: Yes Does the patient have difficulty dressing or bathing?: No Independently performs ADLs?: Yes (appropriate for developmental age) Does the patient have difficulty walking or climbing stairs?: No Weakness of Legs: None Weakness of Arms/Hands: None  Home Assistive Devices/Equipment Home Assistive Devices/Equipment: None    Abuse/Neglect Assessment (Assessment to be complete while patient is alone) Physical Abuse: Denies Verbal Abuse: Denies Sexual Abuse: Denies Exploitation of patient/patient's resources: Denies Self-Neglect: Denies Values / Beliefs Cultural Requests During Hospitalization: None Spiritual Requests During Hospitalization: None Consults Spiritual Care Consult Needed: No Social Work Consult Needed: No Regulatory affairs officer (For Healthcare) Does patient have an advance directive?: No Would patient like information on creating an advanced directive?: No - patient declined information    Additional Information 1:1 In Past 12 Months?: No CIRT Risk: No Elopement Risk: No Does patient have medical clearance?: No  Child/Adolescent Assessment Running Away Risk: Admits  Disposition: Per  Shuvon, NP - patient does not meet criteria for inpatient hospitalization.  Patient will return to his current group home.  The owner of the group home B[facility] [phone]).  CSW will coordinate with the group home for the patient to run home.   Disposition Initial Assessment Completed for this Encounter: Yes  Graciella Freer [place] 10/21/2015 11:52 AM

## 2015-10-21 NOTE — ED Notes (Signed)
Patient states he has not being taken medication states not working. Patient states, March 9th I have Haldol injection and I am going to refused it then.

## 2015-10-21 NOTE — ED Provider Notes (Signed)
CSN: [id number]     Arrival date & time 10/21/15  [id number] History   First MD Initiated Contact with Patient 10/21/15 [phone]     Chief Complaint  Patient presents with  . Chest Pain  . Suicidal     (Consider location/radiation/quality/duration/timing/severity/associated sxs/prior Treatment) HPI   Daryl Campbell is a 65 y.o. male who presents for evaluation of suicidal ideation, chest pain, and chronic abuse of glue inhalation. Is transported by EMS, who treated him for chest pain. The patient states he hears voices, and sometimes they tell him to kill himself, and also he has conversations, with "God" . The patient describes the chest pain as left sternal region, "sharp", associated with nausea and stopped after about one hour. The discomfort occurred this morning, after he was thinking about not wanting to live. He is distraught about chronic use of "glue vapor". He states that he uses it daily, in the woods, and it makes him pass out. He smokes cigarettes. He denies cough, weakness or dizziness. He states that he is not taking his Haldol or lithium tablets, and last had his Haldol injection, on February 9. There are no other known modifying factors.   Past Medical History  Diagnosis Date  . Hypertension   . Psychiatric disorder   . Hepatitis C   . Seizures ([place])   . Schizophrenia ([place])   . Bipolar affective ([place])   . Depression   . Schizoaffective disorder ([place])    History reviewed. No pertinent past surgical history. No family history on file. Social History  Substance Use Topics  . Smoking status: Current Every Day Smoker -- 1.00 packs/day for 3 years    Types: Cigarettes  . Smokeless tobacco: Never Used  . Alcohol Use: No    Review of Systems  All other systems reviewed and are negative.     Allergies  Review of patient's allergies indicates no known allergies.  Home Medications   Prior to Admission medications   Medication Sig Start Date End Date Taking? Authorizing  Provider  acetaminophen (TYLENOL) 500 MG tablet Take 1 tablet (500 mg total) by mouth every 6 (six) hours as needed. Patient not taking: Reported on 05/13/2015 02/13/15   Margarita Mail, PA-C  aspirin EC 81 MG tablet Take 81 mg by mouth daily.    Historical Provider, MD  benztropine (COGENTIN) 0.5 MG tablet Take 0.5 mg by mouth 2 (two) times daily.     Historical Provider, MD  clotrimazole (LOTRIMIN) 1 % cream Apply to affected area 2 times daily Patient not taking: Reported on 05/13/2015 10/28/14   Larene Pickett, PA-C  haloperidol (HALDOL) 2 MG tablet Take 2 mg by mouth at bedtime.    Historical Provider, MD  hydrocortisone cream 1 % Apply to affected area 2 times daily Patient not taking: Reported on 05/13/2015 07/06/14   Alvina Chou, PA-C  lithium carbonate (LITHOBID) 300 MG CR tablet Take 300 mg by mouth 2 (two) times daily.    Historical Provider, MD  methocarbamol (ROBAXIN) 500 MG tablet Take 1 tablet (500 mg total) by mouth 2 (two) times daily. Patient not taking: Reported on 05/13/2015 07/06/14   Alvina Chou, PA-C  permethrin (ELIMITE) 5 % cream Apply to entire body other than face - let sit for 14 hours then wash off, may repeat in 1 week if still having symptoms Patient not taking: Reported on 05/13/2015 10/28/14   Larene Pickett, PA-C  traZODone (DESYREL) 50 MG tablet Take 50 mg by mouth at  bedtime.    Historical Provider, MD   BP 128/88 mmHg  Pulse 83  Temp(Src) 98.7 F (37.1 C)  Resp 18  SpO2 98% Physical Exam  Constitutional: He is oriented to person, place, and time. He appears well-developed and well-nourished.  HENT:  Head: Normocephalic and atraumatic.  Right Ear: External ear normal.  Left Ear: External ear normal.  Eyes: Conjunctivae and EOM are normal. Pupils are equal, round, and reactive to light.  Neck: Normal range of motion and phonation normal. Neck supple.  Cardiovascular: Normal rate, regular rhythm and normal heart sounds.   Pulmonary/Chest: Effort normal  and breath sounds normal. No respiratory distress. He has no wheezes. He has no rales. He exhibits no tenderness and no bony tenderness.  Abdominal: Soft. There is no tenderness.  Musculoskeletal: Normal range of motion.  Neurological: He is alert and oriented to person, place, and time. No cranial nerve deficit or sensory deficit. He exhibits normal muscle tone. Coordination normal.  No dysarthria and aphasia or nystagmus.  Skin: Skin is warm, dry and intact.  Psychiatric: His behavior is normal.  Mild flight of ideas. Not responding to internal stimuli.  Nursing note and vitals reviewed.   ED Course  Procedures (including critical care time)  Medications - No data to display  Patient Vitals for the past 24 hrs:  BP Temp Pulse Resp SpO2  10/21/15 0929 128/88 mmHg 98.7 F (37.1 C) 83 18 98 %  10/21/15 0926 - - - - 95 %   [facility] was contacted by nursing who recommend observation, EKG for prolonged QT, and treat symptomatically.  11:00- patient medically cleared for treatment by psychiatry.  Consult TTS- he was evaluated and they recommended discharge   Labs Review Labs Reviewed  BASIC METABOLIC PANEL  CBC  ETHANOL  TROPONIN I  URINE RAPID DRUG SCREEN, HOSP PERFORMED  LITHIUM LEVEL  I-STAT [place], ED    Imaging Review No results found. I have personally reviewed and evaluated these images and lab results as part of my medical decision-making.   EKG Interpretation   Date/Time:  Friday October 21 2015 09:29:09 EST Ventricular Rate:  86 PR Interval:  162 QRS Duration: 89 QT Interval:  376 QTC Calculation: 450 R Axis:   -40 Text Interpretation:  Sinus rhythm Left axis deviation Low voltage,  precordial leads Probable anteroseptal infarct, old Since last tracing  Nonspecific ST abnormality has resolved Confirmed by Eulis Foster  MD, Antoninette Lerner  [phone]) on 10/21/2015 9:34:27 AM      MDM   Final diagnoses:  Suicidal ideation  Schizophrenia, unspecified type  ([place])  Substance abuse  Nonspecific chest pain  Noncompliance with medication regimen    Schizophrenia, with depression, and suicidal ideation. Substance abuse, glue inhalation, and apparent noncompliance with medication treatments. Doubt ACS, PE, pneumonia, or metabolic instability. He will require evaluation and treatment by the psychiatry service.    Nursing Notes Reviewed/ Care Coordinated Applicable Imaging Reviewed Interpretation of Laboratory Data incorporated into ED treatment  The patient appears reasonably screened and/or stabilized for discharge and I doubt any other medical condition or other [facility] requiring further screening, evaluation, or treatment in the ED at this time prior to discharge.  Plan: Home Medications- usual; Home Treatments- rest; return here if the recommended treatment, does not improve the symptoms; Recommended follow up- PCP and Psychiatry as planned    Daleen Bo, MD 10/22/15 [phone]

## 2015-10-21 NOTE — ED Notes (Signed)
Patient present from a Group home states he started having some left-sided chest Pain non radiating. Patient complains of SOB. Denies any pain on arrival. Patient alert and oriented able to speak in full sentences no obvious signs of distress. Patient given 2 nitro and 324 ASA with EMS.

## 2015-10-21 NOTE — ED Notes (Signed)
Patient states he hearing voices. Patient states, ' They not telling me to kill myself just they will show me something."  Patient also states, " I am suicidal" I want to just die.".

## 2015-10-21 NOTE — ED Provider Notes (Signed)
Patient has been cleared by TTS for return to the group home.  Leo Grosser, MD 10/21/15 [phone]

## 2015-10-21 NOTE — ED Notes (Signed)
Staffing called.  

## 2015-10-21 NOTE — BH Assessment (Signed)
Per Delphia Grates, NP - patient does not meet criteria for inpatient hospitalization. Patient will return to his current group home. The owner of the group home Bettie [phone]). CSW will coordinate with the group home for the patient to run home.

## 2015-10-21 NOTE — ED Notes (Signed)
Sitter at bedside.

## 2015-10-21 NOTE — ED Notes (Signed)
Pt claims he's hearing voices and has commands from God to "spare the Earth".

## 2015-10-21 NOTE — Progress Notes (Signed)
Left voicemail for group home director at [phone] attempting to arrange pt's transportation home from ED.  Cleared psychiatrically for d/c per TTS.  Sharren Bridge, MSW, LCSW Clinical Social Work, Disposition  10/21/2015 [phone]

## 2015-10-21 NOTE — ED Notes (Signed)
Contacted Poison Control spoke with [name]  Advised to do EKG and rule out MI, check electrolytes.  Poison control advised sniffing glue can can caused prolonged QT.

## 2015-10-21 NOTE — ED Notes (Signed)
MD advised patient states he sniff glue daily. Poison control contacted.

## 2015-10-24 DIAGNOSIS — F250 Schizoaffective disorder, bipolar type: Secondary | ICD-10-CM | POA: Diagnosis not present

## 2015-11-10 DIAGNOSIS — F250 Schizoaffective disorder, bipolar type: Secondary | ICD-10-CM | POA: Diagnosis not present

## 2015-11-18 ENCOUNTER — Encounter: Payer: Self-pay | Admitting: Family Medicine

## 2015-11-18 ENCOUNTER — Ambulatory Visit: Payer: Medicare Other | Attending: Family Medicine | Admitting: Family Medicine

## 2015-11-18 VITALS — BP 131/81 | HR 71 | Temp 98.3°F | Resp 16 | Ht 68.0 in | Wt 168.0 lb

## 2015-11-18 DIAGNOSIS — Z7982 Long term (current) use of aspirin: Secondary | ICD-10-CM | POA: Diagnosis not present

## 2015-11-18 DIAGNOSIS — B1920 Unspecified viral hepatitis C without hepatic coma: Secondary | ICD-10-CM | POA: Insufficient documentation

## 2015-11-18 DIAGNOSIS — F17210 Nicotine dependence, cigarettes, uncomplicated: Secondary | ICD-10-CM | POA: Insufficient documentation

## 2015-11-18 DIAGNOSIS — F209 Schizophrenia, unspecified: Secondary | ICD-10-CM

## 2015-11-18 DIAGNOSIS — F319 Bipolar disorder, unspecified: Secondary | ICD-10-CM | POA: Insufficient documentation

## 2015-11-18 DIAGNOSIS — Z114 Encounter for screening for human immunodeficiency virus [HIV]: Secondary | ICD-10-CM | POA: Insufficient documentation

## 2015-11-18 DIAGNOSIS — Z1159 Encounter for screening for other viral diseases: Secondary | ICD-10-CM | POA: Diagnosis not present

## 2015-11-18 DIAGNOSIS — Z79899 Other long term (current) drug therapy: Secondary | ICD-10-CM | POA: Insufficient documentation

## 2015-11-18 DIAGNOSIS — F259 Schizoaffective disorder, unspecified: Secondary | ICD-10-CM | POA: Insufficient documentation

## 2015-11-18 DIAGNOSIS — I10 Essential (primary) hypertension: Secondary | ICD-10-CM | POA: Diagnosis not present

## 2015-11-18 LAB — COMPLETE METABOLIC PANEL WITH GFR
ALBUMIN: 4.2 g/dL (ref 3.6–5.1)
ALT: 26 U/L (ref 9–46)
AST: 26 U/L (ref 10–35)
Alkaline Phosphatase: 89 U/L (ref 40–115)
BILIRUBIN TOTAL: 0.6 mg/dL (ref 0.2–1.2)
BUN: 12 mg/dL (ref 7–25)
CO2: 27 mmol/L (ref 20–31)
CREATININE: 0.93 mg/dL (ref 0.70–1.25)
Calcium: 9.8 mg/dL (ref 8.6–10.3)
Chloride: 103 mmol/L (ref 98–110)
GFR, Est African American: >89 mL/min (ref >=60)
GFR, Est Non African American: 86 mL/min (ref >=60)
GLUCOSE: 99 mg/dL (ref 65–99)
Potassium: 4.7 mmol/L (ref 3.5–5.3)
SODIUM: 138 mmol/L (ref 135–146)
TOTAL PROTEIN: 7.3 g/dL (ref 6.1–8.1)

## 2015-11-18 LAB — CBC
HCT: 42.4 % (ref 39.0–52.0)
HEMOGLOBIN: 14.3 g/dL (ref 13.0–17.0)
MCH: 30.9 pg (ref 26.0–34.0)
MCHC: 33.7 g/dL (ref 30.0–36.0)
MCV: 91.6 fL (ref 78.0–100.0)
MPV: 10.7 fL (ref 8.6–12.4)
PLATELETS: 210 10*3/uL (ref 150–400)
RBC: 4.63 MIL/uL (ref 4.22–5.81)
RDW: 13.2 % (ref 11.5–15.5)
WBC: 4.8 10*3/uL (ref 4.0–10.5)

## 2015-11-18 NOTE — Progress Notes (Signed)
LOGO@  Subjective:  Patient ID: Daryl Campbell, male    DOB: 05-08-1951  Age: 65 y.o. MRN: [id number]  CC: Hypertension   HPI Daryl Campbell presents for   1. Establish care: he comes from [facility]. His previous PCP has passed away. He has no complaints. He is in need of new FL2.   2. Schizoaffective disorder: goes to [facility] for mental health, taking haldol and lithium. Lives in [facility]. Admits that mood is not doing too good. Goes outside and hollars out to God. MAR reviewed, he is getting all medications as prescribed.   Past Medical History  Diagnosis Date  . Hypertension   . Psychiatric disorder   . Hepatitis C   . Seizures ([place])   . Schizophrenia ([place])   . Bipolar affective ([place])   . Depression   . Schizoaffective disorder ([place])     History reviewed. No pertinent past surgical history.  History reviewed. No pertinent family history.  Social History  Substance Use Topics  . Smoking status: Current Every Day Smoker -- 1.00 packs/day for 3 years    Types: Cigarettes  . Smokeless tobacco: Never Used  . Alcohol Use: No    ROS Review of Systems  Constitutional: Negative for fever, chills, fatigue and unexpected weight change.  Eyes: Negative for visual disturbance.  Respiratory: Negative for cough and shortness of breath.   Cardiovascular: Negative for chest pain, palpitations and leg swelling.  Gastrointestinal: Negative for nausea, vomiting, abdominal pain, diarrhea, constipation and blood in stool.  Endocrine: Negative for polydipsia, polyphagia and polyuria.  Musculoskeletal: Positive for myalgias (L quad ) and arthralgias (L knee ). Negative for back pain, gait problem and neck pain.  Skin: Negative for rash.  Allergic/Immunologic: Negative for immunocompromised state.  Hematological: Negative for adenopathy. Does not bruise/bleed easily.  Psychiatric/Behavioral: Negative for suicidal ideas, sleep disturbance and dysphoric mood. The patient is not  nervous/anxious.     Objective:   Today's Vitals: BP 131/81 mmHg  Pulse 71  Temp(Src) 98.3 F (36.8 C) (Oral)  Resp 16  Ht 5\' 8"  (1.727 m)  Wt 168 lb (76.204 kg)  BMI 25.55 kg/m2  SpO2 99%  Physical Exam  Constitutional: He appears well-developed and well-nourished. No distress.  HENT:  Head: Normocephalic and atraumatic.  Neck: Normal range of motion. Neck supple.  Cardiovascular: Normal rate, regular rhythm, normal heart sounds and intact distal pulses.   Pulmonary/Chest: Effort normal and breath sounds normal.  Musculoskeletal: He exhibits no edema.  Neurological: He is alert.  Skin: Skin is warm and dry. No rash noted. No erythema.  Psychiatric: He has a normal mood and affect.    Assessment & Plan:   FL2 and Care Plan Completed  Problem List Items Addressed This Visit    Schizophrenia ([place]) - Primary (Chronic)   Relevant Orders   CBC   COMPLETE METABOLIC PANEL WITH GFR    Other Visit Diagnoses    Screening for HIV (human immunodeficiency virus)        Relevant Orders    HIV antibody (with reflex)       Outpatient Encounter Prescriptions as of 11/18/2015  Medication Sig  . acetaminophen (TYLENOL) 500 MG tablet Take 1 tablet (500 mg total) by mouth every 6 (six) hours as needed. (Patient not taking: Reported on 05/13/2015)  . aspirin EC 81 MG tablet Take 81 mg by mouth daily.  . benztropine (COGENTIN) 0.5 MG tablet Take 0.5 mg by mouth 2 (two) times daily.   Marland Kitchen  clotrimazole (LOTRIMIN) 1 % cream Apply to affected area 2 times daily (Patient not taking: Reported on 05/13/2015)  . haloperidol (HALDOL) 2 MG tablet Take 2 mg by mouth at bedtime.  . hydrocortisone cream 1 % Apply to affected area 2 times daily (Patient not taking: Reported on 05/13/2015)  . lithium carbonate (LITHOBID) 300 MG CR tablet Take 300 mg by mouth 2 (two) times daily.  . methocarbamol (ROBAXIN) 500 MG tablet Take 1 tablet (500 mg total) by mouth 2 (two) times daily. (Patient not taking: Reported on  05/13/2015)  . permethrin (ELIMITE) 5 % cream Apply to entire body other than face - let sit for 14 hours then wash off, may repeat in 1 week if still having symptoms (Patient not taking: Reported on 05/13/2015)  . traZODone (DESYREL) 50 MG tablet Take 50 mg by mouth at bedtime.   No facility-administered encounter medications on file as of 11/18/2015.    Follow-up: No Follow-up on file.    Boykin Nearing MD

## 2015-11-18 NOTE — Patient Instructions (Addendum)
Daryl Campbell was seen today for hypertension.  Diagnoses and all orders for this visit:  Schizophrenia, unspecified type ([place]) -     CBC -     COMPLETE METABOLIC PANEL WITH GFR  Screening for HIV (human immunodeficiency virus) -     HIV antibody (with reflex)  [facility] called when [facility] paperwork ready for pick up F/u in 6 months for wellness visit/BP check   Dr. Adrian Blackwater

## 2015-11-18 NOTE — Progress Notes (Signed)
Establish care  Hx HTN  C/C lt leg pain, no hx injury  Pain scale #7 No tobacco user x 1 week  No suicidal thoughts in the past two weeks

## 2015-11-19 LAB — HIV ANTIBODY (ROUTINE TESTING W REFLEX): HIV: NONREACTIVE

## 2015-11-21 ENCOUNTER — Telehealth: Payer: Self-pay | Admitting: *Deleted

## 2015-11-21 NOTE — Telephone Encounter (Signed)
Left voice message at [facility]  Form at front office ready to be pick up

## 2015-11-21 NOTE — Telephone Encounter (Signed)
-----   Message from Boykin Nearing, MD sent at 11/20/2015 10:41 AM EDT ----- Screening HIV normal CBC and CMP normal

## 2016-01-12 DIAGNOSIS — F250 Schizoaffective disorder, bipolar type: Secondary | ICD-10-CM | POA: Diagnosis not present

## 2016-01-20 DIAGNOSIS — F250 Schizoaffective disorder, bipolar type: Secondary | ICD-10-CM | POA: Diagnosis not present

## 2016-02-09 DIAGNOSIS — F250 Schizoaffective disorder, bipolar type: Secondary | ICD-10-CM | POA: Diagnosis not present

## 2016-02-13 ENCOUNTER — Encounter: Payer: Self-pay | Admitting: Family Medicine

## 2016-02-13 ENCOUNTER — Ambulatory Visit: Payer: Medicare Other | Attending: Family Medicine | Admitting: Family Medicine

## 2016-02-13 VITALS — BP 99/63 | HR 64 | Temp 98.6°F | Resp 16 | Ht 68.0 in | Wt 159.0 lb

## 2016-02-13 DIAGNOSIS — M25562 Pain in left knee: Secondary | ICD-10-CM | POA: Diagnosis not present

## 2016-02-13 DIAGNOSIS — G40909 Epilepsy, unspecified, not intractable, without status epilepticus: Secondary | ICD-10-CM | POA: Insufficient documentation

## 2016-02-13 DIAGNOSIS — Z7982 Long term (current) use of aspirin: Secondary | ICD-10-CM | POA: Diagnosis not present

## 2016-02-13 DIAGNOSIS — M109 Gout, unspecified: Secondary | ICD-10-CM | POA: Diagnosis not present

## 2016-02-13 DIAGNOSIS — R945 Abnormal results of liver function studies: Secondary | ICD-10-CM | POA: Diagnosis not present

## 2016-02-13 DIAGNOSIS — L600 Ingrowing nail: Secondary | ICD-10-CM | POA: Diagnosis not present

## 2016-02-13 DIAGNOSIS — Z8639 Personal history of other endocrine, nutritional and metabolic disease: Secondary | ICD-10-CM | POA: Diagnosis not present

## 2016-02-13 DIAGNOSIS — Z8619 Personal history of other infectious and parasitic diseases: Secondary | ICD-10-CM | POA: Insufficient documentation

## 2016-02-13 DIAGNOSIS — B182 Chronic viral hepatitis C: Secondary | ICD-10-CM | POA: Diagnosis not present

## 2016-02-13 DIAGNOSIS — F209 Schizophrenia, unspecified: Secondary | ICD-10-CM | POA: Insufficient documentation

## 2016-02-13 DIAGNOSIS — M25512 Pain in left shoulder: Secondary | ICD-10-CM | POA: Insufficient documentation

## 2016-02-13 DIAGNOSIS — B351 Tinea unguium: Secondary | ICD-10-CM | POA: Diagnosis not present

## 2016-02-13 DIAGNOSIS — Z8739 Personal history of other diseases of the musculoskeletal system and connective tissue: Secondary | ICD-10-CM

## 2016-02-13 DIAGNOSIS — F17210 Nicotine dependence, cigarettes, uncomplicated: Secondary | ICD-10-CM | POA: Diagnosis not present

## 2016-02-13 LAB — COMPLETE METABOLIC PANEL WITH GFR
ALBUMIN: 4 g/dL (ref 3.6–5.1)
ALT: 22 U/L (ref 9–46)
AST: 23 U/L (ref 10–35)
Alkaline Phosphatase: 82 U/L (ref 40–115)
BUN: 13 mg/dL (ref 7–25)
CALCIUM: 9.2 mg/dL (ref 8.6–10.3)
CHLORIDE: 106 mmol/L (ref 98–110)
CO2: 27 mmol/L (ref 20–31)
Creat: 1.12 mg/dL (ref 0.70–1.25)
GFR, Est African American: 80 mL/min (ref >=60)
GFR, Est Non African American: 69 mL/min (ref >=60)
GLUCOSE: 89 mg/dL (ref 65–99)
POTASSIUM: 3.7 mmol/L (ref 3.5–5.3)
SODIUM: 139 mmol/L (ref 135–146)
TOTAL PROTEIN: 7.1 g/dL (ref 6.1–8.1)
Total Bilirubin: 0.9 mg/dL (ref 0.2–1.2)

## 2016-02-13 LAB — POCT URINALYSIS DIPSTICK
BILIRUBIN UA: NEGATIVE
Blood, UA: NEGATIVE
GLUCOSE UA: NEGATIVE
KETONES UA: NEGATIVE
LEUKOCYTES UA: NEGATIVE
Nitrite, UA: NEGATIVE
Protein, UA: NEGATIVE
SPEC GRAV UA: 1.01
Urobilinogen, UA: 1
pH, UA: 7

## 2016-02-13 LAB — CBC
HEMATOCRIT: 41.1 % (ref 38.5–50.0)
HEMOGLOBIN: 13.8 g/dL (ref 13.2–17.1)
MCH: 31.2 pg (ref 27.0–33.0)
MCHC: 33.6 g/dL (ref 32.0–36.0)
MCV: 92.8 fL (ref 80.0–100.0)
MPV: 10.8 fL (ref 7.5–12.5)
Platelets: 206 10*3/uL (ref 140–400)
RBC: 4.43 MIL/uL (ref 4.20–5.80)
RDW: 14.1 % (ref 11.0–15.0)
WBC: 5.3 10*3/uL (ref 3.8–10.8)

## 2016-02-13 LAB — URIC ACID: Uric Acid, Serum: 6.4 mg/dL (ref 4.0–8.0)

## 2016-02-13 MED ORDER — ACETAMINOPHEN ER 650 MG PO TBCR: 650.0000 mg | EXTENDED_RELEASE_TABLET | Freq: Three times a day (TID) | ORAL | Status: DC | PRN

## 2016-02-13 NOTE — Patient Instructions (Addendum)
Daryl Campbell was seen today for medication problem.  Diagnoses and all orders for this visit:  Hx of hepatitis C -     Hepatitis C antibody, reflex  Hx of gout -     Uric Acid  Schizophrenia, unspecified type ([facility]) -     Lithium level -     CBC -     COMPLETE METABOLIC PANEL WITH GFR -     POCT urinalysis dipstick -     Glucose (CBG)  Onychomycosis with ingrown toenail -     Ambulatory referral to Podiatry  Seizure disorder [facility]) -     Ambulatory referral to Neurology  Left knee pain -     MR Knee Left  Wo Contrast; Future -     acetaminophen (TYLENOL 8 HOUR) 650 MG CR tablet; Take 1 tablet (650 mg total) by mouth every 8 (eight) hours as needed for pain (moderate pain).   Plan:  Patient is medically stable, please have patient f/u with mental health at [facility] for psych evaluation Referrals: podiatry and neuro MRI of L knee to evaluate chronic pain  Add tylenol for pain control   F/u in 3 months  Dr. Adrian Blackwater

## 2016-02-13 NOTE — Assessment & Plan Note (Signed)
Chronic pain No swelling or redness in joint MRI of knee Tylenol for pain control

## 2016-02-13 NOTE — Progress Notes (Signed)
Establish Care  Possible ingrown nail  Requesting referral to podiatrist  No pain today  No tobacco user  No suicidal thoughts in the past two weeks

## 2016-02-13 NOTE — Progress Notes (Signed)
Subjective:  Patient ID: Daryl Campbell, male    DOB: Sep 27, 1950  Age: 65 y.o. MRN: [id number]  CC: Medication Problem   HPI Daryl Campbell has schizophrenia (treated at [facility]) and hx of seizures he lives at [facility] he presents for   1. Med review: he is noted to have increase slurred speech and lack of cooperation with bathing and grooming. He often refuses to take his medications per report. He denies seizures. He denies fever, chills, CP, SOB and dysuria.  2. L knee pain: this is chronic. No joint swelling. He has had normal x-ray in past. He has a sore on the lateral aspect on his L knee.   Social History  Substance Use Topics  . Smoking status: Current Every Day Smoker -- 1.00 packs/day for 3 years    Types: Cigarettes  . Smokeless tobacco: Never Used  . Alcohol Use: No     Outpatient Prescriptions Prior to Visit  Medication Sig Dispense Refill  . aspirin EC 81 MG tablet Take 81 mg by mouth daily.    . benztropine (COGENTIN) 0.5 MG tablet Take 0.5 mg by mouth 2 (two) times daily.     . colchicine 0.6 MG tablet Take 0.6 mg by mouth daily.    . Cyanocobalamin (B-12) 100 MCG TABS Take 1 mg by mouth daily.    . haloperidol (HALDOL) 10 MG tablet Take 10 mg by mouth at bedtime.    . haloperidol (HALDOL) 2 MG tablet Take 2 mg by mouth at bedtime. Reported on 11/18/2015    . lithium carbonate (LITHOBID) 300 MG CR tablet Take 300 mg by mouth 2 (two) times daily.    . QUEtiapine Fumarate (SEROQUEL PO) Take 150 mg by mouth at bedtime as needed and may repeat dose one time if needed.     . traZODone (DESYREL) 50 MG tablet Take 50 mg by mouth at bedtime.     No facility-administered medications prior to visit.    ROS Review of Systems  Constitutional: Negative for fever, chills, fatigue and unexpected weight change.  Eyes: Negative for visual disturbance.  Respiratory: Negative for cough and shortness of breath.   Cardiovascular: Negative for chest pain, palpitations and leg  swelling.  Gastrointestinal: Negative for nausea, vomiting, abdominal pain, diarrhea, constipation and blood in stool.  Endocrine: Negative for polydipsia, polyphagia and polyuria.  Genitourinary: Negative for dysuria.  Musculoskeletal: Positive for arthralgias (L shoulder ). Negative for myalgias, back pain, gait problem and neck pain.  Skin: Negative for rash.  Allergic/Immunologic: Negative for immunocompromised state.  Neurological: Negative for seizures.  Hematological: Negative for adenopathy. Does not bruise/bleed easily.  Psychiatric/Behavioral: Negative for suicidal ideas, sleep disturbance and dysphoric mood. The patient is not nervous/anxious.     Objective:  BP 99/63 mmHg  Pulse 64  Temp(Src) 98.6 F (37 C) (Oral)  Resp 16  Ht 5\' 8"  (1.727 m)  Wt 159 lb (72.122 kg)  BMI 24.18 kg/m2  SpO2 96%  BP/Weight 02/13/2016 11/18/2015 [id number]  Systolic BP 99 [id number]  Diastolic BP 63 81 98  Wt. (Lbs) 159 168 -  BMI 24.18 25.55 -    Physical Exam  Constitutional: He appears well-developed and well-nourished. No distress.  HENT:  Head: Normocephalic and atraumatic.  Neck: Normal range of motion. Neck supple.  Cardiovascular: Normal rate, regular rhythm, normal heart sounds and intact distal pulses.   Pulmonary/Chest: Effort normal and breath sounds normal.  Musculoskeletal: He exhibits no edema.  Left knee: Normal.  Neurological: He is alert. He has normal strength. No cranial nerve deficit or sensory deficit.  Oriented to person and place Oriented to month, but not year or date   Skin: Skin is warm and dry. No rash noted. No erythema.     Psychiatric: He has a normal mood and affect.   Lab Results  Component Value Date   HGBA1C 5.2 04/26/2014  UA: wnl    Assessment & Plan:   Daryl Campbell was seen today for medication problem.  Diagnoses and all orders for this visit:  Hx of hepatitis C -     Hepatitis C antibody, reflex  Hx of gout -     Uric  Acid  Schizophrenia, unspecified type (HCC) -     Lithium level -     CBC -     COMPLETE METABOLIC PANEL WITH GFR -     POCT urinalysis dipstick -     Glucose (CBG)  Onychomycosis with ingrown toenail -     Ambulatory referral to Podiatry  Seizure disorder [facility]) -     Ambulatory referral to Neurology  Left knee pain -     MR Knee Left  Wo Contrast; Future -     acetaminophen (TYLENOL 8 HOUR) 650 MG CR tablet; Take 1 tablet (650 mg total) by mouth every 8 (eight) hours as needed for pain (moderate pain).    No orders of the defined types were placed in this encounter.    Follow-up: No Follow-up on file.   Boykin Nearing MD

## 2016-02-14 LAB — HEPATITIS C ANTIBODY: HCV Ab: REACTIVE — AB

## 2016-02-14 LAB — LITHIUM LEVEL: Lithium Lvl: 0.8 mmol/L (ref 0.6–1.2)

## 2016-02-17 LAB — HEPATITIS C RNA QUANTITATIVE
HCV Quantitative Log: 5.35 {Log} — ABNORMAL HIGH (ref <1.18)
HCV Quantitative: 222507 IU/mL — ABNORMAL HIGH (ref <15)

## 2016-02-17 NOTE — Addendum Note (Signed)
Addended by: Boykin Nearing on: 02/17/2016 11:13 AM   Modules accepted: Orders

## 2016-02-20 ENCOUNTER — Telehealth: Payer: Self-pay | Admitting: *Deleted

## 2016-02-20 NOTE — Telephone Encounter (Signed)
-----   Message from Boykin Nearing, MD sent at 02/17/2016 11:12 AM EDT ----- Hep C viral load is very high All other labs normal Patient has been referred to ID clinic for treatment of Hep C

## 2016-02-20 NOTE — Telephone Encounter (Signed)
Unable to contact pt  Phone number incorrect  

## 2016-02-22 ENCOUNTER — Ambulatory Visit (HOSPITAL_COMMUNITY): Admission: RE | Admit: 2016-02-22 | Payer: Medicare Other | Source: Ambulatory Visit

## 2016-03-08 ENCOUNTER — Telehealth: Payer: Self-pay | Admitting: Family Medicine

## 2016-03-08 DIAGNOSIS — Z8739 Personal history of other diseases of the musculoskeletal system and connective tissue: Secondary | ICD-10-CM

## 2016-03-08 MED ORDER — COLCHICINE 0.6 MG PO TABS: 0.6000 mg | ORAL_TABLET | Freq: Every day | ORAL | Status: DC

## 2016-03-08 NOTE — Telephone Encounter (Signed)
I refilled colchicine All mental health meds should come from [facility], please inform patient

## 2016-03-08 NOTE — Telephone Encounter (Signed)
I am not sure what is supposed to be filled by [place] and what is filled by [facility] and I am unable to reach patient. Will forward request to Dr. Adrian Blackwater.

## 2016-03-08 NOTE — Telephone Encounter (Signed)
Called patient and was unable to reach him or leave a message. If patient calls, please inform him to contact [facility] for his mental health medications.

## 2016-03-08 NOTE — Telephone Encounter (Signed)
Pt called requesting refills on all his medication  States he gets it sent to [facility]. Their number is [phone]

## 2016-03-21 ENCOUNTER — Emergency Department (HOSPITAL_COMMUNITY)
Admission: EM | Admit: 2016-03-21 | Discharge: 2016-03-21 | Discharge status: Against Medical Advice | Payer: Medicare Other

## 2016-03-21 ENCOUNTER — Emergency Department (HOSPITAL_COMMUNITY): Payer: Medicare Other

## 2016-03-21 ENCOUNTER — Emergency Department (HOSPITAL_COMMUNITY)
Admission: EM | Admit: 2016-03-21 | Discharge: 2016-03-21 | Disposition: A | Discharge status: Home / Self Care | Payer: Medicare Other | Attending: Emergency Medicine | Admitting: Emergency Medicine

## 2016-03-21 ENCOUNTER — Encounter (HOSPITAL_COMMUNITY): Payer: Self-pay

## 2016-03-21 DIAGNOSIS — Y939 Activity, unspecified: Secondary | ICD-10-CM | POA: Insufficient documentation

## 2016-03-21 DIAGNOSIS — S0031XA Abrasion of nose, initial encounter: Secondary | ICD-10-CM | POA: Insufficient documentation

## 2016-03-21 DIAGNOSIS — M25561 Pain in right knee: Secondary | ICD-10-CM | POA: Diagnosis not present

## 2016-03-21 DIAGNOSIS — Z79899 Other long term (current) drug therapy: Secondary | ICD-10-CM | POA: Insufficient documentation

## 2016-03-21 DIAGNOSIS — Z7982 Long term (current) use of aspirin: Secondary | ICD-10-CM | POA: Diagnosis not present

## 2016-03-21 DIAGNOSIS — F17210 Nicotine dependence, cigarettes, uncomplicated: Secondary | ICD-10-CM | POA: Diagnosis not present

## 2016-03-21 DIAGNOSIS — S8991XA Unspecified injury of right lower leg, initial encounter: Secondary | ICD-10-CM | POA: Diagnosis present

## 2016-03-21 DIAGNOSIS — W19XXXA Unspecified fall, initial encounter: Secondary | ICD-10-CM

## 2016-03-21 DIAGNOSIS — M25562 Pain in left knee: Secondary | ICD-10-CM | POA: Diagnosis not present

## 2016-03-21 DIAGNOSIS — I10 Essential (primary) hypertension: Secondary | ICD-10-CM | POA: Insufficient documentation

## 2016-03-21 DIAGNOSIS — S80212A Abrasion, left knee, initial encounter: Secondary | ICD-10-CM | POA: Diagnosis not present

## 2016-03-21 DIAGNOSIS — Y929 Unspecified place or not applicable: Secondary | ICD-10-CM | POA: Diagnosis not present

## 2016-03-21 DIAGNOSIS — W01198A Fall on same level from slipping, tripping and stumbling with subsequent striking against other object, initial encounter: Secondary | ICD-10-CM | POA: Insufficient documentation

## 2016-03-21 DIAGNOSIS — S199XXA Unspecified injury of neck, initial encounter: Secondary | ICD-10-CM | POA: Diagnosis not present

## 2016-03-21 DIAGNOSIS — R51 Headache: Secondary | ICD-10-CM | POA: Insufficient documentation

## 2016-03-21 DIAGNOSIS — S0990XA Unspecified injury of head, initial encounter: Secondary | ICD-10-CM | POA: Diagnosis not present

## 2016-03-21 DIAGNOSIS — R296 Repeated falls: Secondary | ICD-10-CM | POA: Diagnosis not present

## 2016-03-21 DIAGNOSIS — Y999 Unspecified external cause status: Secondary | ICD-10-CM | POA: Diagnosis not present

## 2016-03-21 DIAGNOSIS — S80211A Abrasion, right knee, initial encounter: Secondary | ICD-10-CM | POA: Insufficient documentation

## 2016-03-21 LAB — CBC WITH DIFFERENTIAL/PLATELET
Basophils Absolute: 0 10*3/uL (ref 0.0–0.1)
Basophils Relative: 0 %
EOS PCT: 3 %
Eosinophils Absolute: 0.1 10*3/uL (ref 0.0–0.7)
HEMATOCRIT: 39.1 % (ref 39.0–52.0)
Hemoglobin: 13.3 g/dL (ref 13.0–17.0)
LYMPHS ABS: 1.8 10*3/uL (ref 0.7–4.0)
LYMPHS PCT: 34 %
MCH: 31.1 pg (ref 26.0–34.0)
MCHC: 34 g/dL (ref 30.0–36.0)
MCV: 91.4 fL (ref 78.0–100.0)
MONO ABS: 0.4 10*3/uL (ref 0.1–1.0)
MONOS PCT: 8 %
NEUTROS ABS: 2.8 10*3/uL (ref 1.7–7.7)
Neutrophils Relative %: 55 %
PLATELETS: 163 10*3/uL (ref 150–400)
RBC: 4.28 MIL/uL (ref 4.22–5.81)
RDW: 12.5 % (ref 11.5–15.5)
WBC: 5.2 10*3/uL (ref 4.0–10.5)

## 2016-03-21 LAB — COMPREHENSIVE METABOLIC PANEL
ALT: 28 U/L (ref 17–63)
AST: 28 U/L (ref 15–41)
Albumin: 3.8 g/dL (ref 3.5–5.0)
Alkaline Phosphatase: 78 U/L (ref 38–126)
Anion gap: 4 — ABNORMAL LOW (ref 5–15)
BILIRUBIN TOTAL: 1 mg/dL (ref 0.3–1.2)
BUN: 11 mg/dL (ref 6–20)
CHLORIDE: 108 mmol/L (ref 101–111)
CO2: 25 mmol/L (ref 22–32)
CREATININE: 1.12 mg/dL (ref 0.61–1.24)
Calcium: 9.3 mg/dL (ref 8.9–10.3)
Glucose, Bld: 114 mg/dL — ABNORMAL HIGH (ref 65–99)
POTASSIUM: 3.1 mmol/L — AB (ref 3.5–5.1)
Sodium: 137 mmol/L (ref 135–145)
TOTAL PROTEIN: 6.6 g/dL (ref 6.5–8.1)

## 2016-03-21 LAB — LITHIUM LEVEL: LITHIUM LVL: 0.66 mmol/L (ref 0.60–1.20)

## 2016-03-21 NOTE — ED Notes (Signed)
Registration asked patient to pull up sleeve a little so she could place his armband and patient started throwing papers at the registration clerk then walked out.

## 2016-03-21 NOTE — ED Provider Notes (Signed)
Patient reports he tripped and fell a few days ago. Complains of headache, posterior neck pain and bilateral knee pain. On exam alert no distress H ENT exam is an abrasion over the bridge of his nose. No septal hematoma otherwise normocephalic atraumatic neck is supple. Nontender. Lungs clear auscultation heart regular rate and rhythm abdomen nontender pelvis stable nontender bilateral lower extremities with abrasions over the knees. No deformity or swelling. Neurologic cranial nerves II through XII intact gait normal motor strength 5 over 5 overall Romberg normal pronator drift normal  Orlie Dakin, MD 03/21/16 1646

## 2016-03-21 NOTE — ED Notes (Signed)
Patient tripped and fell 2 days ago and now complains of left knee pain and occipital headache. No loc. States he has mild headache. Abrasion and small bruise noted to left knee

## 2016-03-21 NOTE — ED Provider Notes (Signed)
CSN: [id number]     Arrival date & time 03/21/16  1440 History   First MD Initiated Contact with Patient 03/21/16 1603     Chief Complaint  Patient presents with  . Fall   Level 5 caviet due to patient's baseline mental status.   (Consider location/radiation/quality/duration/timing/severity/associated sxs/prior Treatment) Patient is a 65 y.o. male presenting with fall. The history is provided by the patient.  Fall This is a new problem. The current episode started in the past 7 days. The problem has been unchanged. Pertinent negatives include no abdominal pain, chest pain, nausea, neck pain or vomiting. Associated symptoms comments: Knee pain and headache . Exacerbated by: movement of knees. He has tried nothing for the symptoms.    Past Medical History  Diagnosis Date  . Hypertension   . Psychiatric disorder   . Hepatitis C   . Seizures (HCC)   . Schizophrenia (HCC)   . Bipolar affective (HCC)   . Depression   . Schizoaffective disorder (HCC)    History reviewed. No pertinent past surgical history. No family history on file. Social History  Substance Use Topics  . Smoking status: Current Every Day Smoker -- 1.00 packs/day for 3 years    Types: Cigarettes  . Smokeless tobacco: Never Used  . Alcohol Use: No   ROS and history limited by patients baseline mental status.  Review of Systems  Unable to perform ROS: Other  Cardiovascular: Negative for chest pain and palpitations.  Gastrointestinal: Negative for nausea, vomiting and abdominal pain.  Endocrine: Negative.   Genitourinary: Negative for flank pain.  Musculoskeletal: Negative for back pain and neck pain.  Skin: Positive for wound (bilateral knees).  Allergic/Immunologic: Negative.   Neurological: Negative for dizziness, syncope and light-headedness.   Allergies  Review of patient's allergies indicates no known allergies.  Home Medications   Prior to Admission medications   Medication Sig Start Date End Date  Taking? Authorizing Provider  acetaminophen (TYLENOL 8 HOUR) 650 MG CR tablet Take 1 tablet (650 mg total) by mouth every 8 (eight) hours as needed for pain (moderate pain). 02/13/16   Dessa PhiJosalyn Funches, MD  aspirin EC 81 MG tablet Take 81 mg by mouth daily.    Historical Provider, MD  benztropine (COGENTIN) 0.5 MG tablet Take 0.5 mg by mouth 2 (two) times daily.     Historical Provider, MD  colchicine 0.6 MG tablet Take 1 tablet (0.6 mg total) by mouth daily. 03/08/16   Josalyn Funches, MD  Cyanocobalamin (B-12) 100 MCG TABS Take 1 mg by mouth daily.    Historical Provider, MD  haloperidol (HALDOL) 10 MG tablet Take 10 mg by mouth at bedtime.    Historical Provider, MD  lithium carbonate (LITHOBID) 300 MG CR tablet Take 300 mg by mouth 2 (two) times daily.    Historical Provider, MD  QUEtiapine Fumarate (SEROQUEL PO) Take 150 mg by mouth at bedtime as needed and may repeat dose one time if needed.     Historical Provider, MD  traZODone (DESYREL) 50 MG tablet Take 50 mg by mouth at bedtime.    Historical Provider, MD   BP 104/94 mmHg  Pulse 69  Temp(Src) 98 F (36.7 C) (Oral)  Resp 16  SpO2 100% Physical Exam  Constitutional: He is oriented to person, place, and time. He appears well-developed and well-nourished.  HENT:  Head: Normocephalic and atraumatic.    No hyphema, nasal septal hematoma, hemotympanum, battles sign, racoon eyes, or trismus.     Eyes: Conjunctivae and  EOM are normal. Pupils are equal, round, and reactive to light.  Neck: Normal range of motion. Neck supple.  Cardiovascular: Normal rate, regular rhythm, normal heart sounds and intact distal pulses.   Pulmonary/Chest: Effort normal and breath sounds normal. No respiratory distress.  Abdominal: Soft. Bowel sounds are normal. There is no tenderness.  Musculoskeletal: Normal range of motion.       Left knee: He exhibits normal range of motion, no swelling, no effusion, no ecchymosis and no deformity. No tenderness found.        Legs: Neurological: He is alert and oriented to person, place, and time. He has normal strength and normal reflexes. No cranial nerve deficit or sensory deficit. He displays a negative Romberg sign. GCS eye subscore is 4. GCS verbal subscore is 4. GCS motor subscore is 6.  Normal finger to nose bilaterally.   No pronator drift bilaterally.    Skin: Skin is warm and dry.    ED Course  Procedures (including critical care time) Labs Review Labs Reviewed  COMPREHENSIVE METABOLIC PANEL - Abnormal; Notable for the following:    Potassium 3.1 (*)    Glucose, Bld 114 (*)    Anion gap 4 (*)    All other components within normal limits  CBC WITH DIFFERENTIAL/PLATELET  LITHIUM LEVEL    Imaging Review Ct Head Wo Contrast  03/21/2016  CLINICAL DATA:  Status post fall 2 days ago. No loss consciousness. Headache. EXAM: CT HEAD WITHOUT CONTRAST CT CERVICAL SPINE WITHOUT CONTRAST TECHNIQUE: Multidetector CT imaging of the head and cervical spine was performed following the standard protocol without intravenous contrast. Multiplanar CT image reconstructions of the cervical spine were also generated. COMPARISON:  05/14/2015 FINDINGS: CT HEAD FINDINGS There is no evidence of mass effect, midline shift, or extra-axial fluid collections. There is no evidence of a space-occupying lesion or intracranial hemorrhage. There is no evidence of a cortical-based area of acute infarction. There is generalized cerebral atrophy. The ventricles and sulci are appropriate for the patient's age. The basal cisterns are patent. Visualized portions of the orbits are unremarkable. The visualized portions of the paranasal sinuses and mastoid air cells are unremarkable. Cerebrovascular atherosclerotic calcifications are noted. The osseous structures are unremarkable. CT CERVICAL SPINE FINDINGS The alignment is anatomic. The vertebral body heights are maintained. There is no acute fracture. There is no static listhesis. The  prevertebral soft tissues are normal. The intraspinal soft tissues are not fully imaged on this examination due to poor soft tissue contrast, but there is no gross soft tissue abnormality. Degenerative disc disease with disc height loss at C3-4, C4-5, C5-6 and C6-7. Broad-based disc osteophyte complex at C3-4 with bilateral uncovertebral degenerative changes. Broad-based disc osteophyte complex with bilateral uncovertebral degenerative changes C4-5 resulting in bilateral foraminal narrowing. Broad-based disc osteophyte complex and bilateral uncovertebral degenerative changes at C5-6 resulting in bilateral foraminal narrowing. Broad-based disc osteophyte complex and bilateral uncovertebral degenerative changes at C6-7 resulting in bilateral foraminal stenosis. The visualized portions of the lung apices demonstrate no focal abnormality. There is bilateral carotid artery atherosclerosis. IMPRESSION: 1. No acute intracranial pathology. 2. No acute osseous injury of the cervical spine. 3. Cervical spine spondylosis as described above. Electronically Signed   By: Kathreen Devoid   On: 03/21/2016 17:02   Ct Cervical Spine Wo Contrast  03/21/2016  CLINICAL DATA:  Status post fall 2 days ago. No loss consciousness. Headache. EXAM: CT HEAD WITHOUT CONTRAST CT CERVICAL SPINE WITHOUT CONTRAST TECHNIQUE: Multidetector CT imaging of the head and  cervical spine was performed following the standard protocol without intravenous contrast. Multiplanar CT image reconstructions of the cervical spine were also generated. COMPARISON:  05/14/2015 FINDINGS: CT HEAD FINDINGS There is no evidence of mass effect, midline shift, or extra-axial fluid collections. There is no evidence of a space-occupying lesion or intracranial hemorrhage. There is no evidence of a cortical-based area of acute infarction. There is generalized cerebral atrophy. The ventricles and sulci are appropriate for the patient's age. The basal cisterns are patent.  Visualized portions of the orbits are unremarkable. The visualized portions of the paranasal sinuses and mastoid air cells are unremarkable. Cerebrovascular atherosclerotic calcifications are noted. The osseous structures are unremarkable. CT CERVICAL SPINE FINDINGS The alignment is anatomic. The vertebral body heights are maintained. There is no acute fracture. There is no static listhesis. The prevertebral soft tissues are normal. The intraspinal soft tissues are not fully imaged on this examination due to poor soft tissue contrast, but there is no gross soft tissue abnormality. Degenerative disc disease with disc height loss at C3-4, C4-5, C5-6 and C6-7. Broad-based disc osteophyte complex at C3-4 with bilateral uncovertebral degenerative changes. Broad-based disc osteophyte complex with bilateral uncovertebral degenerative changes C4-5 resulting in bilateral foraminal narrowing. Broad-based disc osteophyte complex and bilateral uncovertebral degenerative changes at C5-6 resulting in bilateral foraminal narrowing. Broad-based disc osteophyte complex and bilateral uncovertebral degenerative changes at C6-7 resulting in bilateral foraminal stenosis. The visualized portions of the lung apices demonstrate no focal abnormality. There is bilateral carotid artery atherosclerosis. IMPRESSION: 1. No acute intracranial pathology. 2. No acute osseous injury of the cervical spine. 3. Cervical spine spondylosis as described above. Electronically Signed   By: Kathreen Devoid   On: 03/21/2016 17:02   I have personally reviewed and evaluated these images and lab results as part of my medical decision-making.   EKG Interpretation None      MDM   Final diagnoses:  Fall, initial encounter    The pt is a 65 yo male presenting to the ED after a fall two days prior.  Pt has baseline schizophrenia and hx is limited.  Reports lightheadedness two days prior and tripped and fell landing on knees and hitting top of head on  brick with no LOC.  Brought to ED but no records from home.  Attempted to contact [facility] multiple times.   On exam pt HDS in NAD and walking easily in the ED.  Abrasions over knees bilaterally.  No bony tenderness and ambulating well and do not feel XR's necessary at this time.    Due to difficult hx and reports of dizziness CT head, c-spine and basic labs sent.   CT head and C-spine unremarkable. Ascitic labs including C MP and CBC performed both of which unremarkable. The patient was up-to-date on tetanus shot. Attempted contacting facility again without any answer.  Nursing was able to contact his living facility who reported they would pick him up.  Labs were viewed by myself  incorporated into medical decision making.  Discussed pertinent finding with patient or caregiver prior to discharge with no further questions.  Immediate return precautions given and understood.  Medical decision making supervised by my attending Dr. Winfred Leeds.   Geronimo Boot, MD PGY-3 Emergency Medicine    Geronimo Boot, MD 03/22/16 [id number]  Orlie Dakin, MD 03/22/16 [id number]

## 2016-03-21 NOTE — ED Notes (Signed)
Spoke to [facility], staff will come to pick him up. Phone number is [phone] for future reference.

## 2016-03-22 ENCOUNTER — Encounter: Payer: Self-pay | Admitting: Family Medicine

## 2016-03-22 ENCOUNTER — Telehealth: Payer: Self-pay

## 2016-03-22 ENCOUNTER — Ambulatory Visit: Payer: Medicare Other | Admitting: Neurology

## 2016-03-22 ENCOUNTER — Telehealth: Payer: Self-pay | Admitting: *Deleted

## 2016-03-22 NOTE — Telephone Encounter (Signed)
Please call back to [facility] 1. Negative outcome for patient not receiving haldol is agitation, confusion and insomnia 2. The expectation from the [facility] is to document on the [facility] when is medication is not being administered and why. Also to notify the ordering provider when there is a consistent pattern of the medication not being administered, example missing more than one dose a week.  Patient's mental health care and mental health medications are ordered and managed by [facility].

## 2016-03-22 NOTE — Progress Notes (Signed)
Patient ID: Daryl Campbell, male   DOB: 07/31/1951, 65 y.o.   MRN: [id number] Letter mailed to patient was returned to sender.

## 2016-03-22 NOTE — Telephone Encounter (Signed)
No showed follow up appt. 

## 2016-03-22 NOTE — Telephone Encounter (Signed)
Call received from [place] states they have located an estimated amount of around 180 pills of haldol that were filled several months ago not being administered to patient.  They have orders for Haldol 10 mg and mg per FL2 orders signed by Dr. Adrian Blackwater to be administered at bedtime.Representative Ledell Peoples [place] requesting a response from Dr Adrian Blackwater with the following information provided:  What's the negative outcome of patient not receiving Haldol as instructed by the her?  And what is the expectation of [facility] when medication is not administered?

## 2016-03-23 ENCOUNTER — Encounter: Payer: Self-pay | Admitting: Neurology

## 2016-03-23 NOTE — Telephone Encounter (Signed)
Daryl Campbell called back requesting to speak to PCP regarding pts medication. Please f/up

## 2016-03-26 ENCOUNTER — Emergency Department (HOSPITAL_COMMUNITY)
Admission: EM | Admit: 2016-03-26 | Discharge: 2016-03-26 | Disposition: A | Discharge status: Home / Self Care | Payer: Medicare Other | Attending: Emergency Medicine | Admitting: Emergency Medicine

## 2016-03-26 ENCOUNTER — Encounter (HOSPITAL_COMMUNITY): Payer: Self-pay | Admitting: Emergency Medicine

## 2016-03-26 DIAGNOSIS — Z79899 Other long term (current) drug therapy: Secondary | ICD-10-CM | POA: Insufficient documentation

## 2016-03-26 DIAGNOSIS — Z4800 Encounter for change or removal of nonsurgical wound dressing: Secondary | ICD-10-CM | POA: Insufficient documentation

## 2016-03-26 DIAGNOSIS — F17210 Nicotine dependence, cigarettes, uncomplicated: Secondary | ICD-10-CM | POA: Insufficient documentation

## 2016-03-26 DIAGNOSIS — I10 Essential (primary) hypertension: Secondary | ICD-10-CM | POA: Insufficient documentation

## 2016-03-26 DIAGNOSIS — F250 Schizoaffective disorder, bipolar type: Secondary | ICD-10-CM | POA: Diagnosis not present

## 2016-03-26 DIAGNOSIS — Z7982 Long term (current) use of aspirin: Secondary | ICD-10-CM | POA: Diagnosis not present

## 2016-03-26 DIAGNOSIS — Z5189 Encounter for other specified aftercare: Secondary | ICD-10-CM

## 2016-03-26 MED ORDER — BACITRACIN ZINC 500 UNIT/GM EX OINT: 1.0000 "application " | TOPICAL_OINTMENT | Freq: Two times a day (BID) | CUTANEOUS | 1 refills | Status: DC

## 2016-03-26 MED ORDER — BACITRACIN ZINC 500 UNIT/GM EX OINT: 1.0000 "application " | TOPICAL_OINTMENT | Freq: Two times a day (BID) | CUTANEOUS | Status: DC

## 2016-03-26 NOTE — ED Triage Notes (Signed)
Pt states he was running "too fast" 2 weeks ago and fell, has abrasions to both knees and left hand- Pt also states that he is dizzy-- intermittently-- not dizzy now-- states "when I touch the scabs it makes me dizzy"

## 2016-03-26 NOTE — ED Provider Notes (Signed)
[facility] DEPT Provider Note   CSN: [id number] Arrival date & time: 03/26/16  [id number]  First Provider Contact:  First MD Initiated Contact with Patient 03/26/16 0809        History   Chief Complaint Chief Complaint  Patient presents with  . Wound Check    HPI Daryl Campbell is a 65 y.o. male.  Patient presents to the emergency department for wound check. He reports he tripped and fell about a week ago while running. He tells me he jogs a lot for exercise. He reports he is here to have his scabs checked. He has scabs to his bilateral knees and his left hand. He reports the scabs hurt when touching them. He has not been using bacitracin. His tetanus was updated and the emergency department from his last visit. He denies any additional falls. The patient has a history of mental illness and lives in a group home. He denies fevers, difficulty walking, or discharge from his wounds.   The history is provided by the patient and medical records. No language interpreter was used.  Wound Check  Pertinent negatives include no fever, joint swelling, numbness, rash or weakness.    Past Medical History:  Diagnosis Date  . Bipolar affective ([place])   . Depression   . Hepatitis C   . Hypertension   . Psychiatric disorder   . Schizoaffective disorder ([place])   . Schizophrenia ([place])   . Seizures [facility])     Patient Active Problem List   Diagnosis Date Noted  . Onychomycosis with ingrown toenail 02/13/2016  . Left knee pain 02/13/2016  . Hx of gout 02/13/2016  . Hepatitis C, chronic ([place]) 02/13/2016  . Seizure disorder ([place]) 09/20/2011  . Schizophrenia ([place]) 09/20/2011    History reviewed. No pertinent surgical history.     Home Medications    Prior to Admission medications   Medication Sig Start Date End Date Taking? Authorizing Provider  acetaminophen (TYLENOL 8 HOUR) 650 MG CR tablet Take 1 tablet (650 mg total) by mouth every 8 (eight) hours as needed for pain (moderate pain).  02/13/16   Boykin Nearing, MD  aspirin EC 81 MG tablet Take 81 mg by mouth daily.    Historical Provider, MD  bacitracin ointment Apply 1 application topically 2 (two) times daily. 03/26/16   Waynetta Pean, PA-C  benztropine (COGENTIN) 0.5 MG tablet Take 0.5 mg by mouth 2 (two) times daily.     Historical Provider, MD  colchicine 0.6 MG tablet Take 1 tablet (0.6 mg total) by mouth daily. 03/08/16   Josalyn Funches, MD  Cyanocobalamin (B-12) 100 MCG TABS Take 1 mg by mouth daily.    Historical Provider, MD  haloperidol (HALDOL) 10 MG tablet Take 10 mg by mouth at bedtime.    Historical Provider, MD  lithium carbonate (LITHOBID) 300 MG CR tablet Take 300 mg by mouth 2 (two) times daily.    Historical Provider, MD  QUEtiapine Fumarate (SEROQUEL PO) Take 150 mg by mouth at bedtime as needed and may repeat dose one time if needed.     Historical Provider, MD  traZODone (DESYREL) 50 MG tablet Take 50 mg by mouth at bedtime.    Historical Provider, MD    Family History No family history on file.  Social History Social History  Substance Use Topics  . Smoking status: Current Every Day Smoker    Packs/day: 1.00    Years: 3.00    Types: Cigarettes  . Smokeless tobacco: Never Used  .  Alcohol use No     Allergies   Review of patient's allergies indicates no known allergies.   Review of Systems Review of Systems  Constitutional: Negative for fever.  Musculoskeletal: Negative for gait problem and joint swelling.  Skin: Positive for wound. Negative for color change and rash.  Neurological: Negative for weakness and numbness.     Physical Exam Updated Vital Signs BP 117/78   Pulse (!) 54   Temp 97.4 F (36.3 C) (Oral)   Resp 18   SpO2 96%   Physical Exam  Constitutional: He appears well-developed and well-nourished. No distress.  Nontoxic appearing.  HENT:  Head: Normocephalic and atraumatic.  Eyes: Right eye exhibits no discharge. Left eye exhibits no discharge.  Cardiovascular:  Normal rate, regular rhythm and intact distal pulses.   Bilateral radial pulses are intact.  Pulmonary/Chest: Effort normal. No respiratory distress.  Musculoskeletal: Normal range of motion. He exhibits no edema or deformity.  Neurological: He is alert. Coordination normal.  Normal gait.  Skin: Skin is warm and dry. Capillary refill takes less than 2 seconds. No rash noted. He is not diaphoretic. No erythema.  Patient has small well-healing scabs to his bilateral anterior knees as well as the palmar aspect of his left hand. They are mildly tender overlying the scab. No other bony point tenderness. He is relating with normal gait. No evidence of infection. No erythema, edema, discharge or warmth.  Psychiatric: He has a normal mood and affect. His behavior is normal.  Nursing note and vitals reviewed.    ED Treatments / Results  Labs (all labs ordered are listed, but only abnormal results are displayed) Labs Reviewed - No data to display  EKG  EKG Interpretation None       Radiology No results found.  Procedures Procedures (including critical care time)  Medications Ordered in ED Medications  bacitracin ointment 1 application (not administered)     Initial Impression / Assessment and Plan / ED Course  I have reviewed the triage vital signs and the nursing notes.   Clinical Course    Patient presents to the emergency department for wound check. He reports he tripped and fell about a week ago while running. He tells me he jogs a lot for exercise. He reports he is here to have his scabs checked. He has scabs to his bilateral knees and his left hand. He reports the scabs hurt when touching them. He has not been using bacitracin. His tetanus was updated and the emergency department from his last visit. He denies any additional falls. The patient has a history of mental illness and lives in a group home. On exam the patient is afebrile nontoxic appearing. He has well-healing  scabs to his bilateral knees and the palmar aspect of his left hand. No evidence of infection. He is ambulating with normal gait. Will discharge with prescription for bacitracin. I encouraged follow-up by primary care. I advised the patient to follow-up with their primary care provider this week. I advised the patient to return to the emergency department with new or worsening symptoms or new concerns. The patient verbalized understanding and agreement with plan.     Final Clinical Impressions(s) / ED Diagnoses   Final diagnoses:  Visit for wound check    New Prescriptions New Prescriptions   BACITRACIN OINTMENT    Apply 1 application topically 2 (two) times daily.     Waynetta Pean, PA-C 03/26/16 [place], MD 03/26/16 1535

## 2016-04-03 DIAGNOSIS — F250 Schizoaffective disorder, bipolar type: Secondary | ICD-10-CM | POA: Diagnosis not present

## 2016-04-06 ENCOUNTER — Other Ambulatory Visit: Payer: Self-pay | Admitting: Pharmacist

## 2016-04-06 MED ORDER — COLCRYS 0.6 MG PO TABS: 0.6000 mg | ORAL_TABLET | Freq: Every day | ORAL | 1 refills | Status: DC

## 2016-04-07 DIAGNOSIS — F321 Major depressive disorder, single episode, moderate: Secondary | ICD-10-CM | POA: Diagnosis not present

## 2016-04-07 DIAGNOSIS — F251 Schizoaffective disorder, depressive type: Secondary | ICD-10-CM | POA: Diagnosis not present

## 2016-04-11 ENCOUNTER — Ambulatory Visit (INDEPENDENT_AMBULATORY_CARE_PROVIDER_SITE_OTHER): Payer: Medicare Other | Admitting: Neurology

## 2016-04-11 ENCOUNTER — Encounter: Payer: Self-pay | Admitting: Neurology

## 2016-04-11 VITALS — BP 115/75 | HR 61 | Ht 68.0 in | Wt 165.0 lb

## 2016-04-11 DIAGNOSIS — G40909 Epilepsy, unspecified, not intractable, without status epilepticus: Secondary | ICD-10-CM | POA: Diagnosis not present

## 2016-04-11 DIAGNOSIS — R413 Other amnesia: Secondary | ICD-10-CM

## 2016-04-11 DIAGNOSIS — E538 Deficiency of other specified B group vitamins: Secondary | ICD-10-CM | POA: Diagnosis not present

## 2016-04-11 HISTORY — DX: Other amnesia: R41.3

## 2016-04-11 NOTE — Patient Instructions (Signed)
   WE will check EEG evaluation to look for seizures.

## 2016-04-11 NOTE — Progress Notes (Signed)
Reason for visit: Possible seizures  Referring physician: Dr. Belinda Block is a 65 y.o. male  History of present illness:  Mr. Snowdon is a 65 year old right-handed white male with a history of schizophrenia who is residing at the [facility] adult home. The patient comes with a caretaker who claims that she has known the patient for about 3-1/2 years. There is a question about a seizure disorder. Looking back through the records, the patient appears to have frequent emergency room visits, he has had a lot of issues with getting dizzy when he stands up too quickly, he may stagger about and fall. The patient has apparently had a witnessed seizure event in the emergency room on 09/20/2011. He was placed on Keppra at that point, it is not clear how long he took the medication, but he is not on Keppra at this time. He denies any history of seizures, and his caretaker indicates that over the last 3 and half years she has never seen a seizure. The patient has had a recent CT scan of brain that showed no acute changes. The patient apparently has also demonstrated some difficulty with memory, and difficulty following commands. Once again, the caretaker indicates that there has been no real change in this issue over the last 3 years. The patient may fall up to twice a month, oftentimes associated with dizziness shortly after standing. There is some report of him being sleepy or lethargic. The patient has been on lithium, the levels have been checked recently in the emergency room and were unremarkable. The patient does have a history of hepatitis C. He is sent to this office for an evaluation.  Past Medical History:  Diagnosis Date  . Bipolar affective ([place])   . Depression   . Hepatitis C   . Hypertension   . Memory difficulties 04/11/2016  . Psychiatric disorder   . Schizoaffective disorder ([place])   . Schizophrenia ([place])   . Seizures ([place])     History reviewed. No pertinent surgical  history.  Family History  Problem Relation Age of Onset  . Emphysema Mother   . Diabetes Father   . Schizophrenia Father   . Seizures Neg Hx     Social history:  reports that he has quit smoking. His smoking use included Cigarettes. He has a 3.00 pack-year smoking history. He has never used smokeless tobacco. He reports that he uses drugs, including Marijuana. He reports that he does not drink alcohol.  Medications:  Prior to Admission medications   Medication Sig Start Date End Date Taking? Authorizing Provider  acetaminophen (TYLENOL 8 HOUR) 650 MG CR tablet Take 1 tablet (650 mg total) by mouth every 8 (eight) hours as needed for pain (moderate pain). 02/13/16  Yes Josalyn Funches, MD  aspirin EC 81 MG tablet Take 81 mg by mouth daily.   Yes Historical Provider, MD  bacitracin ointment Apply 1 application topically 2 (two) times daily. 03/26/16  Yes Waynetta Pean, PA-C  benztropine (COGENTIN) 0.5 MG tablet Take 0.5 mg by mouth 2 (two) times daily.    Yes Historical Provider, MD  COLCRYS 0.6 MG tablet Take 1 tablet (0.6 mg total) by mouth daily. 04/06/16  Yes Josalyn Funches, MD  Cyanocobalamin (B-12) 100 MCG TABS Take 1 mg by mouth daily.   Yes Historical Provider, MD  haloperidol (HALDOL) 10 MG tablet Take 10 mg by mouth at bedtime.   Yes Historical Provider, MD  lithium carbonate (LITHOBID) 300 MG CR tablet Take  300 mg by mouth 2 (two) times daily.   Yes Historical Provider, MD  QUEtiapine Fumarate (SEROQUEL PO) Take 150 mg by mouth at bedtime as needed and may repeat dose one time if needed.    Yes Historical Provider, MD  traZODone (DESYREL) 50 MG tablet Take 50 mg by mouth at bedtime.   Yes Historical Provider, MD     No Known Allergies  ROS:  Out of a complete 14 system review of symptoms, the patient complains only of the following symptoms, and all other reviewed systems are negative.  Memory loss, possible seizures Dizziness, falls Depression Not enough sleep, decreased  energy  Blood pressure 115/75, pulse 61, height 5\' 8"  (1.727 m), weight 165 lb (74.8 kg).   Blood pressure, right arm, standing is 116/60. Blood pressure, right arm, sitting is 128/80.  Physical Exam  General: The patient is alert and cooperative at the time of the examination.  Eyes: Pupils are equal, round, and reactive to light. Discs are flat bilaterally.  Neck: The neck is supple, no carotid bruits are noted.  Respiratory: The respiratory examination is clear.  Cardiovascular: The cardiovascular examination reveals a regular rate and rhythm, no obvious murmurs or rubs are noted.  Skin: Extremities are without significant edema.  Neurologic Exam  Mental status: The patient is alert and oriented x 2 (not oriented to date) at the time of the examination. The Mini-Mental Status Examination done today shows a total score of 17/30.  Cranial nerves: Facial symmetry is present. There is good sensation of the face to pinprick and soft touch bilaterally. The strength of the facial muscles and the muscles to head turning and shoulder shrug are normal bilaterally. Speech is well enunciated, no aphasia or dysarthria is noted. Extraocular movements are full. Visual fields are full. The tongue is midline, and the patient has symmetric elevation of the soft palate. No obvious hearing deficits are noted.  Motor: The motor testing reveals 5 over 5 strength of all 4 extremities. Good symmetric motor tone is noted throughout.  Sensory: Sensory testing is intact to pinprick, soft touch, vibration sensation, and position sense on all 4 extremities. No evidence of extinction is noted.  Coordination: Cerebellar testing reveals good finger-nose-finger and heel-to-shin bilaterally. The patient does demonstrate some apraxia with the use of the extremities.  Gait and station: Gait is normal. Tandem gait is unsteady. Romberg is negative. No drift is seen.  Reflexes: Deep tendon reflexes are symmetric and  normal bilaterally. Toes are downgoing bilaterally.   CT head and cervical 03/21/16:  IMPRESSION: 1. No acute intracranial pathology. 2. No acute osseous injury of the cervical spine. 3. Cervical spine spondylosis as described above.  * CT scan images were reviewed online. I agree with the written report.    Assessment/Plan:  1. History of schizophrenia  2. History of witnessed seizure event  3. Memory disturbance  4. Episodic dizziness with standing, falls  The patient appears to have episodes of dizziness, staggering about, subsequent falls that appear to occur shortly after standing up. The patient is on several medications that may promote orthostatic hypotension. The patient is on Seroquel and Cogentin with prominent anticholinergic effects. The caretaker with the patient does not report any significant progression of memory over time. Individuals with schizophrenia oftentimes do have a progressive dementia however, as they get older. The patient has had a witnessed seizure event in the past, he has been off of [place] for several years, no definite recurrence has been noted. The patient  will be sent for an EEG study, if this is unremarkable, we will watch the patient conservatively. Blood work will be checked today. We will follow the memory issues over time. The patient will follow-up in 4 months.    Jill Alexanders MD 04/11/2016 7:19 PM  [facility] [address] [place], [place] [address]  Phone [phone] Fax [phone]

## 2016-04-12 ENCOUNTER — Telehealth: Payer: Self-pay

## 2016-04-12 LAB — SEDIMENTATION RATE: Sed Rate: 2 mm/hr (ref 0–30)

## 2016-04-12 LAB — AMMONIA: Ammonia: 48 ug/dL (ref 27–102)

## 2016-04-12 LAB — RPR: RPR: NONREACTIVE

## 2016-04-12 LAB — VITAMIN B12: Vitamin B-12: 919 pg/mL (ref 211–946)

## 2016-04-12 NOTE — Telephone Encounter (Signed)
-----   Message from Kathrynn Ducking, MD sent at 04/12/2016  4:45 PM EDT -----  The blood work results are unremarkable. Please call the patient.  ----- Message ----- From: Lavone Neri Lab Results In Sent: 04/12/2016   7:43 AM To: Kathrynn Ducking, MD

## 2016-04-12 NOTE — Telephone Encounter (Signed)
Called pt w/ unremarkable lab results. Verbalized understanding and appreciation for call. 

## 2016-04-16 ENCOUNTER — Ambulatory Visit (HOSPITAL_COMMUNITY): Admission: RE | Admit: 2016-04-16 | Payer: Medicare Other | Source: Ambulatory Visit

## 2016-04-23 ENCOUNTER — Ambulatory Visit (HOSPITAL_COMMUNITY): Admission: RE | Admit: 2016-04-23 | Payer: Medicare Other | Source: Ambulatory Visit

## 2016-04-24 DIAGNOSIS — F250 Schizoaffective disorder, bipolar type: Secondary | ICD-10-CM | POA: Diagnosis not present

## 2016-04-26 DIAGNOSIS — F250 Schizoaffective disorder, bipolar type: Secondary | ICD-10-CM | POA: Diagnosis not present

## 2016-05-08 ENCOUNTER — Other Ambulatory Visit: Payer: Medicare Other

## 2016-05-09 ENCOUNTER — Encounter: Payer: Self-pay | Admitting: Neurology

## 2016-05-21 ENCOUNTER — Encounter: Payer: Self-pay | Admitting: Podiatry

## 2016-05-21 ENCOUNTER — Ambulatory Visit (INDEPENDENT_AMBULATORY_CARE_PROVIDER_SITE_OTHER): Payer: Medicare Other | Admitting: Podiatry

## 2016-05-21 VITALS — Ht 68.0 in | Wt 185.0 lb

## 2016-05-21 DIAGNOSIS — B351 Tinea unguium: Secondary | ICD-10-CM

## 2016-05-21 DIAGNOSIS — M79676 Pain in unspecified toe(s): Secondary | ICD-10-CM | POA: Diagnosis not present

## 2016-05-21 NOTE — Progress Notes (Signed)
Patient ID: Daryl Campbell, male   DOB: 16-Jun-1951, 65 y.o.   MRN: [id number] SUBJECTIVE Patient presents to office today complaining of elongated, thickened nails. Pain while ambulating in shoes. Patient is unable to trim their own nails. Patient states that he is going to be moving to [place] this Christmas season, and he'll no longer be able to follow up here in [place] office.  No Known Allergies  OBJECTIVE General Patient is awake, alert, and oriented x 3 and in no acute distress. Derm Skin is dry and supple bilateral. Negative open lesions or macerations. Remaining integument unremarkable. Nails are tender, long, thickened and dystrophic with subungual debris, consistent with onychomycosis, 1-5 bilateral. No signs of infection noted. Vasc  DP and PT pedal pulses palpable bilaterally. Temperature gradient within normal limits.  Neuro Epicritic and protective threshold sensation diminished bilaterally.  Musculoskeletal Exam No symptomatic pedal deformities noted bilateral. Muscular strength within normal limits.  ASSESSMENT 1. Dermatophytosis of nails 1-5 bilateral 2. Onychomycosis of nail due to dermatophyte bilateral 3. Pain in foot bilateral  PLAN OF CARE Patient evaluated today. Instructed to maintain good pedal hygiene and foot care. Stressed importance of controlling blood sugar.  Mechanical debridement of nails 1-5 bilaterally performed using a nail nipper. Filed with dremel without incident.  All patient questions were answered. Return to clinic in 3 mos.    Edrick Kins, DPM

## 2016-05-21 NOTE — Progress Notes (Signed)
   Subjective:    Patient ID: Daryl Campbell, male    DOB: Nov 22, 1950, 65 y.o.   MRN: [id number]  HPI    Review of Systems  All other systems reviewed and are negative.      Objective:   Physical Exam        Assessment & Plan:

## 2016-05-23 DIAGNOSIS — F250 Schizoaffective disorder, bipolar type: Secondary | ICD-10-CM | POA: Diagnosis not present

## 2016-06-01 ENCOUNTER — Other Ambulatory Visit: Payer: Self-pay | Admitting: Pharmacist

## 2016-06-01 MED ORDER — COLCRYS 0.6 MG PO TABS: 0.6000 mg | ORAL_TABLET | Freq: Every day | ORAL | 2 refills | Status: DC

## 2016-06-04 ENCOUNTER — Other Ambulatory Visit: Payer: Self-pay | Admitting: Pharmacist

## 2016-06-04 MED ORDER — COLCRYS 0.6 MG PO TABS: 0.6000 mg | ORAL_TABLET | Freq: Every day | ORAL | 2 refills | Status: DC

## 2016-06-20 DIAGNOSIS — F250 Schizoaffective disorder, bipolar type: Secondary | ICD-10-CM | POA: Diagnosis not present

## 2016-07-12 DIAGNOSIS — F250 Schizoaffective disorder, bipolar type: Secondary | ICD-10-CM | POA: Diagnosis not present

## 2016-07-16 ENCOUNTER — Encounter (HOSPITAL_COMMUNITY): Payer: Self-pay | Admitting: Emergency Medicine

## 2016-07-16 ENCOUNTER — Emergency Department (HOSPITAL_COMMUNITY)
Admission: EM | Admit: 2016-07-16 | Discharge: 2016-07-17 | Disposition: A | Discharge status: Home / Self Care | Payer: Medicare Other | Attending: Emergency Medicine | Admitting: Emergency Medicine

## 2016-07-16 DIAGNOSIS — R45851 Suicidal ideations: Secondary | ICD-10-CM

## 2016-07-16 DIAGNOSIS — F32A Depression, unspecified: Secondary | ICD-10-CM

## 2016-07-16 DIAGNOSIS — Z87891 Personal history of nicotine dependence: Secondary | ICD-10-CM | POA: Insufficient documentation

## 2016-07-16 DIAGNOSIS — F329 Major depressive disorder, single episode, unspecified: Secondary | ICD-10-CM | POA: Insufficient documentation

## 2016-07-16 DIAGNOSIS — Z79899 Other long term (current) drug therapy: Secondary | ICD-10-CM | POA: Diagnosis not present

## 2016-07-16 DIAGNOSIS — Z7982 Long term (current) use of aspirin: Secondary | ICD-10-CM | POA: Insufficient documentation

## 2016-07-16 DIAGNOSIS — F203 Undifferentiated schizophrenia: Secondary | ICD-10-CM | POA: Diagnosis not present

## 2016-07-16 DIAGNOSIS — Z818 Family history of other mental and behavioral disorders: Secondary | ICD-10-CM | POA: Diagnosis not present

## 2016-07-16 DIAGNOSIS — I10 Essential (primary) hypertension: Secondary | ICD-10-CM | POA: Diagnosis not present

## 2016-07-16 DIAGNOSIS — Z833 Family history of diabetes mellitus: Secondary | ICD-10-CM | POA: Diagnosis not present

## 2016-07-16 LAB — CBC
HCT: 43.2 % (ref 39.0–52.0)
Hemoglobin: 15.3 g/dL (ref 13.0–17.0)
MCH: 32.2 pg (ref 26.0–34.0)
MCHC: 35.4 g/dL (ref 30.0–36.0)
MCV: 90.9 fL (ref 78.0–100.0)
PLATELETS: 221 10*3/uL (ref 150–400)
RBC: 4.75 MIL/uL (ref 4.22–5.81)
RDW: 12.6 % (ref 11.5–15.5)
WBC: 5.2 10*3/uL (ref 4.0–10.5)

## 2016-07-16 LAB — ACETAMINOPHEN LEVEL

## 2016-07-16 LAB — COMPREHENSIVE METABOLIC PANEL
ALT: 48 U/L (ref 17–63)
AST: 42 U/L — AB (ref 15–41)
Albumin: 4.4 g/dL (ref 3.5–5.0)
Alkaline Phosphatase: 89 U/L (ref 38–126)
Anion gap: 6 (ref 5–15)
BUN: 16 mg/dL (ref 6–20)
CHLORIDE: 106 mmol/L (ref 101–111)
CO2: 27 mmol/L (ref 22–32)
Calcium: 9.7 mg/dL (ref 8.9–10.3)
Creatinine, Ser: 0.97 mg/dL (ref 0.61–1.24)
Glucose, Bld: 108 mg/dL — ABNORMAL HIGH (ref 65–99)
POTASSIUM: 4.2 mmol/L (ref 3.5–5.1)
SODIUM: 139 mmol/L (ref 135–145)
Total Bilirubin: 1.3 mg/dL — ABNORMAL HIGH (ref 0.3–1.2)
Total Protein: 7.8 g/dL (ref 6.5–8.1)

## 2016-07-16 LAB — RAPID URINE DRUG SCREEN, HOSP PERFORMED
AMPHETAMINES: NOT DETECTED
BENZODIAZEPINES: NOT DETECTED
Barbiturates: NOT DETECTED
COCAINE: NOT DETECTED
OPIATES: NOT DETECTED
Tetrahydrocannabinol: NOT DETECTED

## 2016-07-16 LAB — ETHANOL

## 2016-07-16 LAB — SALICYLATE LEVEL

## 2016-07-16 MED ORDER — LITHIUM CARBONATE 300 MG PO CAPS
300.0000 mg | ORAL_CAPSULE | Freq: Two times a day (BID) | ORAL | Status: DC
  Administered 2016-07-16 – 2016-07-17 (×3): 300 mg via ORAL
  Filled 2016-07-16 (×3): qty 1

## 2016-07-16 MED ORDER — TRAZODONE HCL 50 MG PO TABS
50.0000 mg | ORAL_TABLET | Freq: Every day | ORAL | Status: DC
  Administered 2016-07-16: 50 mg via ORAL
  Filled 2016-07-16: qty 1

## 2016-07-16 MED ORDER — MIRTAZAPINE 30 MG PO TABS
15.0000 mg | ORAL_TABLET | Freq: Every day | ORAL | Status: DC
  Administered 2016-07-16: 15 mg via ORAL
  Filled 2016-07-16: qty 1

## 2016-07-16 MED ORDER — QUETIAPINE FUMARATE ER 50 MG PO TB24
150.0000 mg | ORAL_TABLET | Freq: Every day | ORAL | Status: DC
  Administered 2016-07-16: 150 mg via ORAL
  Filled 2016-07-16: qty 3

## 2016-07-16 MED ORDER — LORAZEPAM 1 MG PO TABS
2.0000 mg | ORAL_TABLET | Freq: Once | ORAL | Status: AC
  Administered 2016-07-16: 2 mg via ORAL
  Filled 2016-07-16: qty 2

## 2016-07-16 MED ORDER — QUETIAPINE FUMARATE 100 MG PO TABS
100.0000 mg | ORAL_TABLET | Freq: Once | ORAL | Status: AC
  Administered 2016-07-16: 100 mg via ORAL
  Filled 2016-07-16: qty 1

## 2016-07-16 NOTE — ED Provider Notes (Signed)
[facility] DEPT Provider Note   CSN: [id number] Arrival date & time: 07/16/16  1212     History   Chief Complaint Chief Complaint  Patient presents with  . Medical Clearance  . Suicidal    HPI Daryl Campbell is a 65 y.o. male.  HPI  65 year old male with a history of bipolar disorder, schizophrenia, depression with prior suicide attempts presents to the ED with suicidal ideation and hallucinations. Patient's plan was to slice his wrist. He reports being compliant with his medications. Denies any homicidal ideations. Denies any other physical complaints.  Past Medical History:  Diagnosis Date  . Bipolar affective ([place])   . Depression   . Hepatitis C   . Hypertension   . Memory difficulties 04/11/2016  . Psychiatric disorder   . Schizoaffective disorder ([place])   . Schizophrenia ([place])   . Seizures [facility])     Patient Active Problem List   Diagnosis Date Noted  . Memory difficulties 04/11/2016  . Onychomycosis with ingrown toenail 02/13/2016  . Left knee pain 02/13/2016  . Hx of gout 02/13/2016  . Hepatitis C, chronic ([place]) 02/13/2016  . Seizure disorder ([place]) 09/20/2011  . Schizophrenia ([place]) 09/20/2011    History reviewed. No pertinent surgical history.     Home Medications    Prior to Admission medications   Medication Sig Start Date End Date Taking? Authorizing Provider  benztropine (COGENTIN) 0.5 MG tablet Take 0.5 mg by mouth 2 (two) times daily.    Yes Historical Provider, MD  haloperidol (HALDOL) 10 MG tablet Take 10 mg by mouth at bedtime.   Yes Historical Provider, MD  lithium carbonate 300 MG capsule Take 300 mg by mouth 2 (two) times daily. 06/05/16  Yes Historical Provider, MD  mirtazapine (REMERON) 15 MG tablet Take 15 mg by mouth at bedtime. 04/26/16  Yes Historical Provider, MD  acetaminophen (TYLENOL 8 HOUR) 650 MG CR tablet Take 1 tablet (650 mg total) by mouth every 8 (eight) hours as needed for pain (moderate pain). 02/13/16   Boykin Nearing,  MD  aspirin EC 81 MG tablet Take 81 mg by mouth daily.    Historical Provider, MD  bacitracin ointment Apply 1 application topically 2 (two) times daily. Patient not taking: Reported on 07/16/2016 03/26/16   Waynetta Pean, PA-C  COLCRYS 0.6 MG tablet Take 1 tablet (0.6 mg total) by mouth daily. 06/04/16   Josalyn Funches, MD  Cyanocobalamin (B-12) 100 MCG TABS Take 1 mg by mouth daily.    Historical Provider, MD  haloperidol decanoate (HALDOL DECANOATE) 100 MG/ML injection Inject 200 mg into the muscle every 28 (twenty-eight) days.    Historical Provider, MD  QUEtiapine Fumarate (SEROQUEL XR) 150 MG 24 hr tablet Take 150 mg by mouth at bedtime.    Historical Provider, MD  traZODone (DESYREL) 50 MG tablet Take 50 mg by mouth at bedtime.    Historical Provider, MD    Family History Family History  Problem Relation Age of Onset  . Emphysema Mother   . Diabetes Father   . Schizophrenia Father   . Seizures Neg Hx     Social History Social History  Substance Use Topics  . Smoking status: Former Smoker    Packs/day: 1.00    Years: 3.00    Types: Cigarettes  . Smokeless tobacco: Never Used  . Alcohol use No     Allergies   Patient has no known allergies.   Review of Systems Review of Systems Ten systems are reviewed and are  negative for acute change except as noted in the HPI   Physical Exam Updated Vital Signs BP 132/97 (BP Location: Right Arm)   Pulse 81   Temp 98.4 F (36.9 C) (Oral)   Ht 5\' 8"  (1.727 m)   Wt 180 lb (81.6 kg)   SpO2 99%   BMI 27.37 kg/m   Physical Exam  Constitutional: He is oriented to person, place, and time. He appears well-developed and well-nourished. No distress.  HENT:  Head: Normocephalic and atraumatic.  Nose: Nose normal.  Eyes: Conjunctivae and EOM are normal. Pupils are equal, round, and reactive to light. Right eye exhibits no discharge. Left eye exhibits no discharge. No scleral icterus.  Neck: Normal range of motion. Neck supple.    Cardiovascular: Normal rate and regular rhythm.  Exam reveals no gallop and no friction rub.   No murmur heard. Pulmonary/Chest: Effort normal and breath sounds normal. No stridor. No respiratory distress. He has no rales.  Abdominal: Soft. He exhibits no distension. There is no tenderness.  Musculoskeletal: He exhibits no edema or tenderness.  Neurological: He is alert and oriented to person, place, and time.  Skin: Skin is warm and dry. No rash noted. He is not diaphoretic. No erythema.  Psychiatric: He has a normal mood and affect.  Vitals reviewed.    ED Treatments / Results  Labs (all labs ordered are listed, but only abnormal results are displayed) Labs Reviewed  COMPREHENSIVE METABOLIC PANEL - Abnormal; Notable for the following:       Result Value   Glucose, Bld 108 (*)    AST 42 (*)    Total Bilirubin 1.3 (*)    All other components within normal limits  ACETAMINOPHEN LEVEL - Abnormal; Notable for the following:    Acetaminophen (Tylenol), Serum <10 (*)    All other components within normal limits  ETHANOL  SALICYLATE LEVEL  CBC  RAPID URINE DRUG SCREEN, HOSP PERFORMED    EKG  EKG Interpretation None       Radiology No results found.  Procedures Procedures (including critical care time)  Medications Ordered in ED Medications  lithium carbonate capsule 300 mg (300 mg Oral Given 07/16/16 1456)  mirtazapine (REMERON) tablet 15 mg (not administered)  QUEtiapine (SEROQUEL XR) 24 hr tablet 150 mg (not administered)  traZODone (DESYREL) tablet 50 mg (not administered)  QUEtiapine (SEROQUEL) tablet 100 mg (100 mg Oral Given 07/16/16 1613)  LORazepam (ATIVAN) tablet 2 mg (2 mg Oral Given 07/16/16 1613)     Initial Impression / Assessment and Plan / ED Course  I have reviewed the triage vital signs and the nursing notes.  Pertinent labs & imaging results that were available during my care of the patient were reviewed by me and considered in my medical  decision making (see chart for details).  Clinical Course     High risk patient. Labs without evidence of acute process. Patient is medically cleared for behavioral health evaluation.  Final Clinical Impressions(s) / ED Diagnoses   Final diagnoses:  Depression, unspecified depression type  Suicidal ideation      Fatima Blank, MD 07/16/16 1626

## 2016-07-16 NOTE — ED Notes (Addendum)
On admission to the [facility] Pt is in his room yelling and raving that God is talking to him. He holds a paper bad up in the air, over his face yelling into it. When staff try to redirect him, he ignores them. He said, "I hear God's voice talking to me."

## 2016-07-16 NOTE — ED Notes (Signed)
Daryl Campbell, family friend phone number is [phone]

## 2016-07-16 NOTE — ED Notes (Signed)
Pt is in bed awake. Pt reports relief from medication given by previous shift. Pt denies SI/HI and pain. Pt reports he stopped taken medication 2 days ago. Medication administered as prescribed.

## 2016-07-16 NOTE — BH Assessment (Signed)
Assessment Note  Daryl Campbell is an 65 y.o. male with history of Depression, Bipolar Affective, and Schizophrenia. He presents to [facility] voluntarily. He was brought to [facility] by his 2 roommate. Sts that he rents a room from his roommates for $780. Patient also has a DSS-[place] guardian [name]). Patient here today with auditory and visual hallucinations. He sts, "God is talking to me". He hears Gods voice telling him, "God you are the my son, I have a plan for you, I am praying for you, God is God, Son in God we trust, I am God you know" . Sts that the voices go from quiet to loud. Sts that the voices are sometimes God's children. During the assessment patient carries a full dialogue stating he is talking to God. He reports hearing voices for several years. The voices have however worsened in the past 2 weeks "off and on". He has visual hallucinations of rings, zigzags, and patterns flying around his head.   Patient denies current suicidal ideations. Patient has tried to harm himself in the past by cutting. The previous suicide attempt was triggered by auditory hallucinations. He has no history of suicide attempts or gestures. Patient reports a increased in depressive symptoms including fatigue, isolating self from others, and crying spells. Patient kneels on the floor during the assessment in starts the prayer. He also stands in the mirror and carries on a dialogue with God in the mirror.   Patient denies HI. No history of assaultive or aggressive behaviors. No alcohol of drug use. Patient hospitalized at  [facility] and a "facility in [place]". He is unable to recall the dates of his previous hospital admissions. He seeks outpatient mental health with [facility]. He has a upcoming appointment at [facility] 07/18/2016 and on that date he is due a Haldol injection.   Diagnosis: Bipolar Affective Disorder; Depressive Disorder, Severe; Schizoaffective Disorder; Schizophrenia  Past Medical History:  Past Medical  History:  Diagnosis Date  . Bipolar affective ([place])   . Depression   . Hepatitis C   . Hypertension   . Memory difficulties 04/11/2016  . Psychiatric disorder   . Schizoaffective disorder ([place])   . Schizophrenia ([place])   . Seizures ([place])     History reviewed. No pertinent surgical history.  Family History:  Family History  Problem Relation Age of Onset  . Emphysema Mother   . Diabetes Father   . Schizophrenia Father   . Seizures Neg Hx     Social History:  reports that he has quit smoking. His smoking use included Cigarettes. He has a 3.00 pack-year smoking history. He has never used smokeless tobacco. He reports that he uses drugs, including Marijuana. He reports that he does not drink alcohol.  Additional Social History:     CIWA: CIWA-Ar BP: 132/97 Pulse Rate: 81 COWS:    Allergies: No Known Allergies  Home Medications:  (Not in a hospital admission)  OB/GYN Status:  No LMP for male patient.  General Assessment Data Location of Assessment: [facility] ED TTS Assessment: In system Is this a Tele or Face-to-Face Assessment?: Face-to-Face Is this an Initial Assessment or a Re-assessment for this encounter?: Initial Assessment Marital status: Single Maiden name:  (n/a) Is patient pregnant?: No Pregnancy Status: No Living Arrangements: Other (Comment), Other relatives (lives with 2 room mates; rents a room for $780/month) Can pt return to current living arrangement?: Yes Admission Status: Voluntary Is patient capable of signing voluntary admission?: Yes Referral Source: Self/Family/Friend Insurance type:  (Medicare)  Medical Screening Exam ([place]) Medical Exam completed: No Reason for MSE not completed:  (n/a)  Crisis Care Plan Living Arrangements: Other (Comment), Other relatives (lives with 2 room mates; rents a room for $780/month) Legal Guardian: Other: Malachy Mood [place]-DSS) Name of Psychiatrist:  ("I don't know if I have one or not"; "I think  [facility]") Name of Therapist:  ("I don't know.Abelardo Diesel")  Education Status Is patient currently in school?: No Current Grade:  (n/a) Highest grade of school patient has completed:  (9th grade dropped out of school; Has a GED) Name of school:  (n/a) Contact person:  Radiographer, therapeutic [place]key-DSS in [place])  Risk to self with the past 6 months Suicidal Ideation: Yes-Currently Present Has patient been a risk to self within the past 6 months prior to admission? : Yes Suicidal Intent: Yes-Currently Present Has patient had any suicidal intent within the past 6 months prior to admission? : Yes Is patient at risk for suicide?: Yes Suicidal Plan?: No Has patient had any suicidal plan within the past 6 months prior to admission? : No Specify Current Suicidal Plan:  (denies) Access to Means: No What has been your use of drugs/alcohol within the last 12 months?:  (denies ) Previous Attempts/Gestures: Yes How many times?:  (yrs ago tried to cut self) Other Self Harm Risks:  (denies ) Triggers for Past Attempts: Other (Comment) (auditory hallucinations) Intentional Self Injurious Behavior: None Family Suicide History: Unknown Recent stressful life event(s): Other (Comment) ("problems in the world..things that I see on television") Persecutory voices/beliefs?: No Depression: Yes Depression Symptoms: Feeling angry/irritable, Feeling worthless/self pity, Loss of interest in usual pleasures, Guilt, Fatigue, Isolating Substance abuse history and/or treatment for substance abuse?: No Suicide prevention information given to non-admitted patients: Not applicable  Risk to Others within the past 6 months Homicidal Ideation: No Does patient have any lifetime risk of violence toward others beyond the six months prior to admission? : No Thoughts of Harm to Others: No Current Homicidal Intent: No Current Homicidal Plan: No Access to Homicidal Means: No Identified Victim:  (n/a) History of harm to others?:  No Assessment of Violence:  (currently calm and cooperative ) Violent Behavior Description:  (currently calm and cooperative ) Does patient have access to weapons?: No Criminal Charges Pending?: No Does patient have a court date: No Is patient on probation?: No  Psychosis Hallucinations: Auditory, Visual ("I hear Jesus talking to me"...responding to God during asse) Delusions: Unspecified ("I am God, I created the universe, I am the powerful almight)  Mental Status Report Appearance/Hygiene: In scrubs Eye Contact: Good Motor Activity: Freedom of movement Speech: Logical/coherent Level of Consciousness: Alert Mood: Depressed Affect: Appropriate to circumstance Anxiety Level: None Thought Processes: Relevant, Coherent Judgement: Impaired Orientation: Person, Situation, Time, Place Obsessive Compulsive Thoughts/Behaviors: None  Cognitive Functioning Concentration: Decreased Memory: Recent Intact, Remote Intact IQ: Average Insight: Poor Impulse Control: Poor Appetite: Fair Weight Loss:  ("I gain and loose...up and down") Weight Gain:  ("I gain and loose...never consistent") Sleep: No Change Total Hours of Sleep:  (8 hrs or more ) Vegetative Symptoms: None  ADLScreening [facility] Assessment Services) Patient's cognitive ability adequate to safely complete daily activities?: Yes Patient able to express need for assistance with ADLs?: Yes Independently performs ADLs?: Yes (appropriate for developmental age)  Prior Inpatient Therapy Prior Inpatient Therapy: Yes Prior Therapy Dates:  ("I don't remember") Prior Therapy Facilty/Provider(s):  [facility] and "a facility in [place]") Reason for Treatment:  (psychosis )  Prior Outpatient Therapy Prior Outpatient Therapy:  Yes Prior Therapy Dates:  (current) Prior Therapy Facilty/Provider(s):  ([facility]...upcoming appointment 07/18/2016 @ 2pm; Haldol shot) Reason for Treatment:  (Haldol shot; medication managmen t) Does patient have an  ACCT team?: No Does patient have Intensive In-House Services?  : Yes Does patient have [facility] services? : No Does patient have P4CC services?: No  ADL Screening (condition at time of admission) Patient's cognitive ability adequate to safely complete daily activities?: Yes Patient able to express need for assistance with ADLs?: Yes Independently performs ADLs?: Yes (appropriate for developmental age)             Advance Directives ([place]) Does patient have an advance directive?: No Would patient like information on creating an advanced directive?: No - patient declined information    Additional Information 1:1 In Past 12 Months?: No CIRT Risk: No Elopement Risk: No Does patient have medical clearance?: Yes     Disposition:  Disposition Initial Assessment Completed for this Encounter: Yes  On Site Evaluation by:   Reviewed with Physician:    Waldon Merl 07/16/2016 3:24 PM

## 2016-07-16 NOTE — ED Notes (Signed)
Pt shared that he started sniffing glue when he was 65 years old, after his parents died. He wrote "GOD" in the bottom of the bag before he sniffed it. The last time he sniffed glue was about one year ago, and he said that it is not good for his brain. He wrote "GOD" in the bottom of the bag that he was yelling into with a crayon today.

## 2016-07-16 NOTE — ED Triage Notes (Signed)
Family friend states individual is SI and threw out his medication because he didn't want to take it anymore.

## 2016-07-17 DIAGNOSIS — Z79899 Other long term (current) drug therapy: Secondary | ICD-10-CM

## 2016-07-17 DIAGNOSIS — Z818 Family history of other mental and behavioral disorders: Secondary | ICD-10-CM | POA: Diagnosis not present

## 2016-07-17 DIAGNOSIS — Z833 Family history of diabetes mellitus: Secondary | ICD-10-CM

## 2016-07-17 DIAGNOSIS — Z87891 Personal history of nicotine dependence: Secondary | ICD-10-CM | POA: Diagnosis not present

## 2016-07-17 DIAGNOSIS — F203 Undifferentiated schizophrenia: Secondary | ICD-10-CM | POA: Diagnosis not present

## 2016-07-17 MED ORDER — LITHIUM CARBONATE 300 MG PO CAPS: 300.0000 mg | ORAL_CAPSULE | Freq: Two times a day (BID) | ORAL | 0 refills | Status: AC

## 2016-07-17 MED ORDER — MIRTAZAPINE 15 MG PO TABS: 15.0000 mg | ORAL_TABLET | Freq: Every day | ORAL | 0 refills | Status: AC

## 2016-07-17 MED ORDER — BENZTROPINE MESYLATE 0.5 MG PO TABS: 0.5000 mg | ORAL_TABLET | Freq: Two times a day (BID) | ORAL | 0 refills | Status: AC

## 2016-07-17 MED ORDER — HALOPERIDOL 10 MG PO TABS: 10.0000 mg | ORAL_TABLET | Freq: Every day | ORAL | 0 refills | Status: DC

## 2016-07-17 MED ORDER — QUETIAPINE FUMARATE ER 150 MG PO TB24: 150.0000 mg | ORAL_TABLET | Freq: Every day | ORAL | 0 refills | Status: DC

## 2016-07-17 NOTE — ED Notes (Signed)
Pt given Gatorade and encouraged to increase fluid intake throughout the day. Pt advised to rise slowly from sitting and lying position.

## 2016-07-17 NOTE — Consult Note (Signed)
[facility] Consult   Reason for Consult:  Suicidal ideations Referring Physician:  EDP Patient Identification: Daryl Campbell MRN:  [id number] Principal Diagnosis: Schizophrenia, undifferentiated ([place]) Diagnosis:   Patient Active Problem List   Diagnosis Date Noted  . Schizophrenia, undifferentiated ([place]) [F20.3] 09/20/2011    Priority: High  . Memory difficulties [R41.3] 04/11/2016  . Onychomycosis with ingrown toenail [B35.1, L60.0] 02/13/2016  . Left knee pain [M25.562] 02/13/2016  . Hx of gout [Z87.39] 02/13/2016  . Hepatitis C, chronic ([place]) [B18.2] 02/13/2016  . Seizure disorder ([place]) [S96.283] 09/20/2011    Total Time spent with patient: 45 minutes  Subjective:   Daryl Campbell is a 65 y.o. male patient does not warrant admission.  HPI:  65 yo male who was brought to the ED for suicidal ideations and threw his medications away.  Today, he says he feels much better after he got Seroquel and Ativan.  He is on Haldol and his injection is scheduled tomorrow at [facility].  He is pleasant and denies suicidal/homicidal ideations, hallucinations, and alcohol/drug issues.  Rasheen plans on going to [employer].  Stable for discharge at this time.  Past Psychiatric History: schizophrenia  Risk to Self: Suicidal Ideation: Yes-Currently Present Suicidal Intent: Yes-Currently Present Is patient at risk for suicide?: Yes Suicidal Plan?: No Specify Current Suicidal Plan:  (denies) Access to Means: No What has been your use of drugs/alcohol within the last 12 months?:  (denies ) How many times?:  (yrs ago tried to cut self) Other Self Harm Risks:  (denies ) Triggers for Past Attempts: Other (Comment) (auditory hallucinations) Intentional Self Injurious Behavior: None Risk to Others: Homicidal Ideation: No Thoughts of Harm to Others: No Current Homicidal Intent: No Current Homicidal Plan: No Access to Homicidal Means: No Identified Victim:  (n/a) History of harm to  others?: No Assessment of Violence:  (currently calm and cooperative ) Violent Behavior Description:  (currently calm and cooperative ) Does patient have access to weapons?: No Criminal Charges Pending?: No Does patient have a court date: No Prior Inpatient Therapy: Prior Inpatient Therapy: Yes Prior Therapy Dates:  ("I don't remember") Prior Therapy Facilty/Provider(s):  [facility] and "a facility in [place]") Reason for Treatment:  (psychosis ) Prior Outpatient Therapy: Prior Outpatient Therapy: Yes Prior Therapy Dates:  (current) Prior Therapy Facilty/Provider(s):  ([facility]...upcoming appointment 07/18/2016 @ 2pm; Haldol shot) Reason for Treatment:  (Haldol shot; medication managmen t) Does patient have an ACCT team?: No Does patient have Intensive In-House Services?  : Yes Does patient have [facility] services? : No Does patient have P4CC services?: No  Past Medical History:  Past Medical History:  Diagnosis Date  . Bipolar affective ([place])   . Depression   . Hepatitis C   . Hypertension   . Memory difficulties 04/11/2016  . Psychiatric disorder   . Schizoaffective disorder ([place])   . Schizophrenia ([place])   . Seizures ([place])    History reviewed. No pertinent surgical history. Family History:  Family History  Problem Relation Age of Onset  . Emphysema Mother   . Diabetes Father   . Schizophrenia Father   . Seizures Neg Hx    Family Psychiatric  History: none Social History:  History  Alcohol Use No     History  Drug Use  . Types: Marijuana    Comment: States that he has used PCP, ectasy, THC, and sniffing glue but states quit a few months ago    Social History   Social History  . Marital  status: Single    Spouse name: N/A  . Number of children: N/A  . Years of education: 10   Occupational History  . N/A    Social History Main Topics  . Smoking status: Former Smoker    Packs/day: 1.00    Years: 3.00    Types: Cigarettes  . Smokeless tobacco: Never Used  .  Alcohol use No  . Drug use:     Types: Marijuana     Comment: States that he has used PCP, ectasy, THC, and sniffing glue but states quit a few months ago  . Sexual activity: Not Currently     Comment: pt huffs glue   Other Topics Concern  . None   Social History Narrative   Right-handed   Caffeine: rare coffee, drinks tea and soda   Additional Social History:    Allergies:  No Known Allergies  Labs:  Results for orders placed or performed during the hospital encounter of 07/16/16 (from the past 48 hour(s))  Comprehensive metabolic panel     Status: Abnormal   Collection Time: 07/16/16 12:47 PM  Result Value Ref Range   Sodium 139 135 - 145 mmol/L   Potassium 4.2 3.5 - 5.1 mmol/L   Chloride 106 101 - 111 mmol/L   CO2 27 22 - 32 mmol/L   Glucose, Bld 108 (H) 65 - 99 mg/dL   BUN 16 6 - 20 mg/dL   Creatinine, Ser 0.97 0.61 - 1.24 mg/dL   Calcium 9.7 8.9 - 10.3 mg/dL   Total Protein 7.8 6.5 - 8.1 g/dL   Albumin 4.4 3.5 - 5.0 g/dL   AST 42 (H) 15 - 41 U/L   ALT 48 17 - 63 U/L   Alkaline Phosphatase 89 38 - 126 U/L   Total Bilirubin 1.3 (H) 0.3 - 1.2 mg/dL   GFR calc non Af Amer >60 >60 mL/min   GFR calc Af Amer >60 >60 mL/min    Comment: (NOTE) The eGFR has been calculated using the CKD EPI equation. This calculation has not been validated in all clinical situations. eGFR's persistently <60 mL/min signify possible Chronic Kidney Disease.    Anion gap 6 5 - 15  Ethanol     Status: None   Collection Time: 07/16/16 12:47 PM  Result Value Ref Range   Alcohol, Ethyl (B) <5 <5 mg/dL    Comment:        LOWEST DETECTABLE LIMIT FOR SERUM ALCOHOL IS 5 mg/dL FOR MEDICAL PURPOSES ONLY   Salicylate level     Status: None   Collection Time: 07/16/16 12:47 PM  Result Value Ref Range   Salicylate Lvl <8.8 2.8 - 30.0 mg/dL  Acetaminophen level     Status: Abnormal   Collection Time: 07/16/16 12:47 PM  Result Value Ref Range   Acetaminophen (Tylenol), Serum <10 (L) 10 - 30  ug/mL    Comment:        THERAPEUTIC CONCENTRATIONS VARY SIGNIFICANTLY. A RANGE OF 10-30 ug/mL MAY BE AN EFFECTIVE CONCENTRATION FOR MANY PATIENTS. HOWEVER, SOME ARE BEST TREATED AT CONCENTRATIONS OUTSIDE THIS RANGE. ACETAMINOPHEN CONCENTRATIONS >150 ug/mL AT 4 HOURS AFTER INGESTION AND >50 ug/mL AT 12 HOURS AFTER INGESTION ARE OFTEN ASSOCIATED WITH TOXIC REACTIONS.   cbc     Status: None   Collection Time: 07/16/16 12:47 PM  Result Value Ref Range   WBC 5.2 4.0 - 10.5 K/uL   RBC 4.75 4.22 - 5.81 MIL/uL   Hemoglobin 15.3 13.0 - 17.0 g/dL  HCT 43.2 39.0 - 52.0 %   MCV 90.9 78.0 - 100.0 fL   MCH 32.2 26.0 - 34.0 pg   MCHC 35.4 30.0 - 36.0 g/dL   RDW 12.6 11.5 - 15.5 %   Platelets 221 150 - 400 K/uL  Rapid urine drug screen (hospital performed)     Status: None   Collection Time: 07/16/16 12:59 PM  Result Value Ref Range   Opiates NONE DETECTED NONE DETECTED   Cocaine NONE DETECTED NONE DETECTED   Benzodiazepines NONE DETECTED NONE DETECTED   Amphetamines NONE DETECTED NONE DETECTED   Tetrahydrocannabinol NONE DETECTED NONE DETECTED   Barbiturates NONE DETECTED NONE DETECTED    Comment:        DRUG SCREEN FOR MEDICAL PURPOSES ONLY.  IF CONFIRMATION IS NEEDED FOR ANY PURPOSE, NOTIFY LAB WITHIN 5 DAYS.        LOWEST DETECTABLE LIMITS FOR URINE DRUG SCREEN Drug Class       Cutoff (ng/mL) Amphetamine      1000 Barbiturate      200 Benzodiazepine   361 Tricyclics       443 Opiates          300 Cocaine          300 THC              50     Current Facility-Administered Medications  Medication Dose Route Frequency Provider Last Rate Last Dose  . lithium carbonate capsule 300 mg  300 mg Oral BID Fatima Blank, MD   300 mg at 07/16/16 2105  . mirtazapine (REMERON) tablet 15 mg  15 mg Oral QHS Fatima Blank, MD   15 mg at 07/16/16 2107  . QUEtiapine (SEROQUEL XR) 24 hr tablet 150 mg  150 mg Oral QHS Fatima Blank, MD   150 mg at 07/16/16 2107  .  traZODone (DESYREL) tablet 50 mg  50 mg Oral QHS Fatima Blank, MD   50 mg at 07/16/16 2106   Current Outpatient Prescriptions  Medication Sig Dispense Refill  . benztropine (COGENTIN) 0.5 MG tablet Take 0.5 mg by mouth 2 (two) times daily.     . haloperidol (HALDOL) 10 MG tablet Take 10 mg by mouth at bedtime.    Marland Kitchen lithium carbonate 300 MG capsule Take 300 mg by mouth 2 (two) times daily.  0  . mirtazapine (REMERON) 15 MG tablet Take 15 mg by mouth at bedtime.  3  . acetaminophen (TYLENOL 8 HOUR) 650 MG CR tablet Take 1 tablet (650 mg total) by mouth every 8 (eight) hours as needed for pain (moderate pain).    Marland Kitchen aspirin EC 81 MG tablet Take 81 mg by mouth daily.    . bacitracin ointment Apply 1 application topically 2 (two) times daily. (Patient not taking: Reported on 07/16/2016) 15 g 1  . COLCRYS 0.6 MG tablet Take 1 tablet (0.6 mg total) by mouth daily. 30 tablet 2  . Cyanocobalamin (B-12) 100 MCG TABS Take 1 mg by mouth daily.    . haloperidol decanoate (HALDOL DECANOATE) 100 MG/ML injection Inject 200 mg into the muscle every 28 (twenty-eight) days.    Marland Kitchen QUEtiapine Fumarate (SEROQUEL XR) 150 MG 24 hr tablet Take 150 mg by mouth at bedtime.    . traZODone (DESYREL) 50 MG tablet Take 50 mg by mouth at bedtime.      Musculoskeletal: Strength & Muscle Tone: within normal limits Gait & Station: normal Patient leans: N/A  Psychiatric Specialty Exam:  Physical Exam  Constitutional: He appears well-developed and well-nourished.  HENT:  Head: Normocephalic.  Neck: Normal range of motion.  Respiratory: Effort normal.  Musculoskeletal: Normal range of motion.  Neurological: He is alert.  Skin: Skin is warm and dry.  Psychiatric: He has a normal mood and affect. His speech is normal and behavior is normal. Judgment and thought content normal. Cognition and memory are normal.    Review of Systems  Constitutional: Negative.   HENT: Negative.   Eyes: Negative.   Respiratory:  Negative.   Cardiovascular: Negative.   Gastrointestinal: Negative.   Genitourinary: Negative.   Musculoskeletal: Negative.   Skin: Negative.   Neurological: Negative.   Endo/Heme/Allergies: Negative.   Psychiatric/Behavioral: Negative.     Blood pressure (!) 80/58, pulse 80, temperature 98.1 F (36.7 C), temperature source Oral, resp. rate 17, height _0  (1.727 m), weight 81.6 kg (180 lb), SpO2 95 %.Body mass index is 27.37 kg/m.  General Appearance: Casual  Eye Contact:  Good  Speech:  Normal Rate  Volume:  Normal  Mood:  Euthymic  Affect:  Congruent  Thought Process:  Coherent and Descriptions of Associations: Intact  Orientation:  Full (Time, Place, and Person)  Thought Content:  WDL  Suicidal Thoughts:  No  Homicidal Thoughts:  No  Memory:  Immediate;   Good Recent;   Good Remote;   Good  Judgement:  Fair  Insight:  Fair  Psychomotor Activity:  Normal  Concentration:  Concentration: Good and Attention Span: Good  Recall:  Good  Fund of Knowledge:  Good  Language:  Good  Akathisia:  No  Handed:  Right  AIMS (if indicated):     Assets:  Housing Leisure Time Physical Health Resilience Social Support  ADL's:  Intact  Cognition:  WNL  Sleep:        Treatment Plan Summary: Daily contact with patient to assess and evaluate symptoms and progress in treatment, Medication management and Plan schizophrenia, undifferentiated:  -Crisis stabilization -Medication management:  Lithium 300 mg BID for mood stabilization, Remeron 15 mg at bedtime for sleep, Seroquel 150 mg at bedtime for psychosis, and Trazodone 50 mg  sleep at bedtime -Individual counseling -Rx provided  Disposition: No evidence of imminent risk to self or others at present.    Waylan Boga, NP 07/17/2016 9:54 AM  Patient seen face-to-face for psychiatric evaluation, chart reviewed and case discussed with the physician extender and developed treatment plan. Reviewed the information documented and  agree with the treatment plan. Corena Pilgrim, MD

## 2016-07-17 NOTE — BH Assessment (Signed)
[facility] Assessment Progress Note  Per Corena Pilgrim, MD, this pt does not require psychiatric hospitalization at this time.  Pt is to be discharged from [facility] with recommendation to continue treatment with [facility], where he is scheduled to receive an injection tomorrow, 07/18/2016.  This has been included in pt's discharge instructions.  Pt's nurse, Dawnaly, has been notified.  Jalene Mullet, [place] Triage Specialist [phone]

## 2016-07-17 NOTE — Discharge Instructions (Signed)
For your ongoing mental health needs, you are advised to continue treatment at [facility]:       [facility]      [address]      [place], [place] [address]      [phone]

## 2016-07-17 NOTE — ED Notes (Signed)
Patient discharged to home.  Denies thoughts of harm to self or others.  States hallucinations have stopped since starting back on his medications.  He left the unit ambulatory.  All belongings returned and signed for.  He was given a copy of his AVS and he was given follow up instructions.  He was also provided with prescriptions.  He was escorted to the front lobby.  He called his friend for a ride home and was waiting for him to come by.

## 2016-07-17 NOTE — [facility] Suicide Risk Assessment (Signed)
Suicide Risk Assessment  Discharge Assessment   [facility] Discharge Suicide Risk Assessment   Principal Problem: Undifferentiated schizophrenia [facility]) Discharge Diagnoses:  Patient Active Problem List   Diagnosis Date Noted  . Undifferentiated schizophrenia ([place]) [F20.3] 09/20/2011    Priority: High  . Memory difficulties [R41.3] 04/11/2016  . Onychomycosis with ingrown toenail [B35.1, L60.0] 02/13/2016  . Left knee pain [M25.562] 02/13/2016  . Hx of gout [Z87.39] 02/13/2016  . Hepatitis C, chronic ([place]) [B18.2] 02/13/2016  . Seizure disorder ([place]) [G40.909] 09/20/2011    Total Time spent with patient: 45 minutes  Musculoskeletal: Strength & Muscle Tone: within normal limits Gait & Station: normal Patient leans: N/A  Psychiatric Specialty Exam: Physical Exam  Constitutional: He appears well-developed and well-nourished.  HENT:  Head: Normocephalic.  Neck: Normal range of motion.  Respiratory: Effort normal.  Musculoskeletal: Normal range of motion.  Neurological: He is alert.  Skin: Skin is warm and dry.  Psychiatric: He has a normal mood and affect. His speech is normal and behavior is normal. Judgment and thought content normal. Cognition and memory are normal.    Review of Systems  Constitutional: Negative.   HENT: Negative.   Eyes: Negative.   Respiratory: Negative.   Cardiovascular: Negative.   Gastrointestinal: Negative.   Genitourinary: Negative.   Musculoskeletal: Negative.   Skin: Negative.   Neurological: Negative.   Endo/Heme/Allergies: Negative.   Psychiatric/Behavioral: Negative.     Blood pressure (!) 80/58, pulse 80, temperature 98.1 F (36.7 C), temperature source Oral, resp. rate 17, height 5\' 8"  (1.727 m), weight 81.6 kg (180 lb), SpO2 95 %.Body mass index is 27.37 kg/m.  General Appearance: Casual  Eye Contact:  Good  Speech:  Normal Rate  Volume:  Normal  Mood:  Euthymic  Affect:  Congruent  Thought Process:  Coherent and Descriptions of  Associations: Intact  Orientation:  Full (Time, Place, and Person)  Thought Content:  WDL  Suicidal Thoughts:  No  Homicidal Thoughts:  No  Memory:  Immediate;   Good Recent;   Good Remote;   Good  Judgement:  Fair  Insight:  Fair  Psychomotor Activity:  Normal  Concentration:  Concentration: Good and Attention Span: Good  Recall:  Good  Fund of Knowledge:  Good  Language:  Good  Akathisia:  No  Handed:  Right  AIMS (if indicated):     Assets:  Housing Leisure Time Physical Health Resilience Social Support  ADL's:  Intact  Cognition:  WNL  Sleep:       Mental Status Per Nursing Assessment::   On Admission:   suicidal ideations  Demographic Factors:  Male, Age 65 or older and Caucasian  Loss Factors: NA  Historical Factors: NA  Risk Reduction Factors:   Sense of responsibility to family, Living with another person, especially a relative, Positive social support and Positive therapeutic relationship  Continued Clinical Symptoms:  None  Cognitive Features That Contribute To Risk:  None    Suicide Risk:  Minimal: No identifiable suicidal ideation.  Patients presenting with no risk factors but with morbid ruminations; may be classified as minimal risk based on the severity of the depressive symptoms    Plan Of Care/Follow-up recommendations:  Activity:  as tolerated Diet:  heart healthy diet  Concha Sudol, NP 07/17/2016, 10:04 AM

## 2016-07-18 DIAGNOSIS — F250 Schizoaffective disorder, bipolar type: Secondary | ICD-10-CM | POA: Diagnosis not present

## 2016-08-13 DIAGNOSIS — F251 Schizoaffective disorder, depressive type: Secondary | ICD-10-CM | POA: Diagnosis not present

## 2016-08-13 DIAGNOSIS — F321 Major depressive disorder, single episode, moderate: Secondary | ICD-10-CM | POA: Diagnosis not present

## 2016-08-15 ENCOUNTER — Encounter: Payer: Self-pay | Admitting: Neurology

## 2016-08-15 ENCOUNTER — Ambulatory Visit (INDEPENDENT_AMBULATORY_CARE_PROVIDER_SITE_OTHER): Payer: Medicare Other | Admitting: Neurology

## 2016-08-15 VITALS — BP 126/79 | HR 70 | Ht 64.0 in | Wt 177.5 lb

## 2016-08-15 DIAGNOSIS — G40909 Epilepsy, unspecified, not intractable, without status epilepticus: Secondary | ICD-10-CM | POA: Diagnosis not present

## 2016-08-15 DIAGNOSIS — N429 Disorder of prostate, unspecified: Secondary | ICD-10-CM | POA: Diagnosis not present

## 2016-08-15 DIAGNOSIS — E559 Vitamin D deficiency, unspecified: Secondary | ICD-10-CM | POA: Diagnosis not present

## 2016-08-15 DIAGNOSIS — F250 Schizoaffective disorder, bipolar type: Secondary | ICD-10-CM | POA: Diagnosis not present

## 2016-08-15 DIAGNOSIS — R413 Other amnesia: Secondary | ICD-10-CM | POA: Diagnosis not present

## 2016-08-15 DIAGNOSIS — E785 Hyperlipidemia, unspecified: Secondary | ICD-10-CM | POA: Diagnosis not present

## 2016-08-15 MED ORDER — METHOCARBAMOL 500 MG PO TABS: 500.0000 mg | ORAL_TABLET | Freq: Three times a day (TID) | ORAL | 1 refills | Status: DC

## 2016-08-15 NOTE — Progress Notes (Signed)
Reason for visit: Schizophrenia, memory disorder  Daryl Campbell is an 65 y.o. male  History of present illness:  Daryl Campbell is a 65 year old right-handed white male with a history of schizophrenia. He was recently in the emergency room for decompensation of his schizophrenia on 07/16/2016. There is a history of a witnessed seizure in 2013, he was set up for an EEG study on his last visit, but this never occurred. The patient indicates that he fell about one week ago and he is hurt his right back and he has developed headaches. The patient has some left knee discomfort as well. The patient denies any numbness or weakness of the extremities, he has some problems with dizziness at times. He denies any further seizures or blackouts. Unfortunately, the patient comes into the office today by himself, there was no one with him to offer a history.  Past Medical History:  Diagnosis Date  . Bipolar affective ([place])   . Depression   . Hepatitis C   . Hypertension   . Memory difficulties 04/11/2016  . Psychiatric disorder   . Schizoaffective disorder ([place])   . Schizophrenia ([place])   . Seizures ([place])     History reviewed. No pertinent surgical history.  Family History  Problem Relation Age of Onset  . Emphysema Mother   . Diabetes Father   . Schizophrenia Father   . Seizures Neg Hx     Social history:  reports that he has quit smoking. His smoking use included Cigarettes. He has a 3.00 pack-year smoking history. He has never used smokeless tobacco. He reports that he uses drugs, including Marijuana. He reports that he does not drink alcohol.   No Known Allergies  Medications:  Prior to Admission medications   Medication Sig Start Date End Date Taking? Authorizing Provider  acetaminophen (TYLENOL 8 HOUR) 650 MG CR tablet Take 1 tablet (650 mg total) by mouth every 8 (eight) hours as needed for pain (moderate pain). 02/13/16  Yes Josalyn Funches, MD  aspirin EC 81 MG tablet Take 81 mg by  mouth daily.   Yes Historical Provider, MD  bacitracin ointment Apply 1 application topically 2 (two) times daily. 03/26/16  Yes Waynetta Pean, PA-C  benztropine (COGENTIN) 0.5 MG tablet Take 1 tablet (0.5 mg total) by mouth 2 (two) times daily. 07/17/16  Yes Patrecia Pour, NP  COLCRYS 0.6 MG tablet Take 1 tablet (0.6 mg total) by mouth daily. 06/04/16  Yes Josalyn Funches, MD  Cyanocobalamin (B-12) 100 MCG TABS Take 1 mg by mouth daily.   Yes Historical Provider, MD  haloperidol (HALDOL) 10 MG tablet Take 1 tablet (10 mg total) by mouth at bedtime. 07/17/16  Yes Patrecia Pour, NP  haloperidol decanoate (HALDOL DECANOATE) 100 MG/ML injection Inject 200 mg into the muscle every 28 (twenty-eight) days.   Yes Historical Provider, MD  lithium carbonate 300 MG capsule Take 1 capsule (300 mg total) by mouth 2 (two) times daily. 07/17/16  Yes Patrecia Pour, NP  mirtazapine (REMERON) 15 MG tablet Take 1 tablet (15 mg total) by mouth at bedtime. 07/17/16  Yes Patrecia Pour, NP  QUEtiapine Fumarate (SEROQUEL XR) 150 MG 24 hr tablet Take 1 tablet (150 mg total) by mouth at bedtime. 07/17/16  Yes Patrecia Pour, NP  traZODone (DESYREL) 50 MG tablet Take 50 mg by mouth at bedtime.   Yes Historical Provider, MD    ROS:  Out of a complete 14 system review of symptoms, the  patient complains only of the following symptoms, and all other reviewed systems are negative.  Excessive eating Back pain Weakness  Blood pressure 118/75, pulse 67, height 5\' 4"  (1.626 m), weight 177 lb 8 oz (80.5 kg).  Physical Exam  General: The patient is alert and cooperative at the time of the examination.  Neuromuscular: The patient is able to flex the low back to about 110. Minimal discomfort is noted with palpation over the right SI joint.  Skin: No significant peripheral edema is noted.   Neurologic Exam  Mental status: The patient is alert and oriented x 3 at the time of the examination. The patient has apparent  normal recent and remote memory, with an apparently normal attention span and concentration ability.   Cranial nerves: Facial symmetry is present. Speech is normal, no aphasia or dysarthria is noted. Extraocular movements are full. Visual fields are full.  Motor: The patient has good strength in all 4 extremities.  Sensory examination: Soft touch sensation is symmetric on the face, arms, and legs.  Coordination: The patient has good finger-nose-finger and heel-to-shin bilaterally.  Gait and station: The patient has a normal gait. Tandem gait is unsteady. Romberg is negative. No drift is seen.  Reflexes: Deep tendon reflexes are symmetric.   Assessment/Plan:  1. Schizophrenia  2. Memory disturbance  3. History of a seizure event  4. Recent onset back pain, headache  The patient will be placed on methocarbamol for his back pain over the next 10 days, he may use ibuprofen 600 mg 3 times a day with food. The patient will call me if the back pain and headache continued. The patient will be set up for an EEG study again. He will follow-up in 6 months.  Jill Alexanders MD 08/15/2016 12:19 PM  [facility] [address] [place], [place] [address]  Phone [phone] Fax [phone]

## 2016-08-15 NOTE — Patient Instructions (Signed)
   We will reorder the EEG.  For the low back pain, start methocarbamol as a muscle relaxant for the next 10 days. May also take ibuprofen 200 mg tablets, 3 tablets three times a day with food.

## 2016-08-16 ENCOUNTER — Telehealth: Payer: Self-pay

## 2016-08-16 NOTE — Telephone Encounter (Signed)
PA approved from 05/18/16 - 08/16/2017 per fax from [employer] [phone].

## 2016-09-10 DIAGNOSIS — F250 Schizoaffective disorder, bipolar type: Secondary | ICD-10-CM | POA: Diagnosis not present

## 2016-09-12 DIAGNOSIS — F250 Schizoaffective disorder, bipolar type: Secondary | ICD-10-CM | POA: Diagnosis not present

## 2016-09-26 ENCOUNTER — Ambulatory Visit (INDEPENDENT_AMBULATORY_CARE_PROVIDER_SITE_OTHER): Payer: Medicare Other | Admitting: Neurology

## 2016-09-26 ENCOUNTER — Telehealth: Payer: Self-pay | Admitting: Neurology

## 2016-09-26 ENCOUNTER — Other Ambulatory Visit: Payer: Medicare Other

## 2016-09-26 DIAGNOSIS — G40909 Epilepsy, unspecified, not intractable, without status epilepticus: Secondary | ICD-10-CM

## 2016-09-26 NOTE — Telephone Encounter (Signed)
I called the patient. The EEG was normal.  

## 2016-09-26 NOTE — Procedures (Signed)
    History:  Daryl Campbell is a 66 year old gentleman with a history of schizophrenia. The patient has had a witnessed seizure in 2013, but no definite recurrence since that time. The patient has recently fallen and hurt his back and he has had headaches. The patient is being evaluated for the prior history of seizures.  This is a routine EEG. No skull defects are noted. Medications include Tylenol, aspirin, Cogentin, Colcrys, vitamin B-12 supplementation, Haldol, lithium, Remeron, Seroquel, and trazodone.   EEG classification: Essentially normal awake  Description of the recording: The background rhythms of this recording consists of a fairly well modulated medium amplitude alpha rhythm of 9 Hz that is reactive to eye opening and closure. As the record progresses, the patient appears to remain in the waking state throughout the recording. Photic stimulation was performed, resulting in a bilateral and symmetric photic driving response. Hyperventilation was not performed. At no time during the recording does there appear to be evidence of spike or spike wave discharges or evidence of focal slowing. EKG monitor shows no evidence of cardiac rhythm abnormalities with a heart rate of 60.  Impression: This is an essentially normal EEG recording in the waking state. No evidence of ictal or interictal discharges are seen.

## 2016-10-10 DIAGNOSIS — F250 Schizoaffective disorder, bipolar type: Secondary | ICD-10-CM | POA: Diagnosis not present

## 2016-10-15 ENCOUNTER — Telehealth: Payer: Self-pay | Admitting: Neurology

## 2016-10-15 NOTE — Telephone Encounter (Signed)
Daryl Campbell [facility] [phone] called request refill for mirtazapine (REMERON) 15 MG tablet and haloperidol (HALDOL) 10 MG tablet.[facility] She said he is almost out of these medications. She doesn't know who pt's PCP is.  She said the pt has been living with her since March 2017. She said her son brings the pt to his appointments at the clinic. She was advised a new DPR needs to updated adding her and her son.

## 2016-10-15 NOTE — Telephone Encounter (Signed)
Dr Jannifer Franklin- please advise. Does not look like you have prescribed these medications in the past. You last saw patient on  08/15/16 for memory problems/seizure. You gave pt rx robaxin.

## 2016-10-15 NOTE — Telephone Encounter (Signed)
I called the mother. The medications she has requested have come through [facility], I gave them the number to call to get these prescriptions refilled.

## 2016-10-24 ENCOUNTER — Ambulatory Visit: Payer: Medicare Other | Admitting: Adult Health

## 2016-11-07 DIAGNOSIS — F250 Schizoaffective disorder, bipolar type: Secondary | ICD-10-CM | POA: Diagnosis not present

## 2016-11-29 ENCOUNTER — Telehealth: Payer: Self-pay | Admitting: *Deleted

## 2016-11-29 NOTE — Telephone Encounter (Signed)
Daryl Campbell states she would like to get an appt for pt.

## 2016-12-06 DIAGNOSIS — Z79899 Other long term (current) drug therapy: Secondary | ICD-10-CM | POA: Diagnosis not present

## 2016-12-06 DIAGNOSIS — F250 Schizoaffective disorder, bipolar type: Secondary | ICD-10-CM | POA: Diagnosis not present

## 2016-12-06 DIAGNOSIS — F09 Unspecified mental disorder due to known physiological condition: Secondary | ICD-10-CM | POA: Diagnosis not present

## 2016-12-07 DIAGNOSIS — F250 Schizoaffective disorder, bipolar type: Secondary | ICD-10-CM | POA: Diagnosis not present

## 2016-12-12 ENCOUNTER — Ambulatory Visit: Payer: Medicare Other | Admitting: Podiatry

## 2017-01-03 DIAGNOSIS — F250 Schizoaffective disorder, bipolar type: Secondary | ICD-10-CM | POA: Diagnosis not present

## 2017-01-16 ENCOUNTER — Encounter: Payer: Self-pay | Admitting: Family Medicine

## 2017-01-21 DIAGNOSIS — F321 Major depressive disorder, single episode, moderate: Secondary | ICD-10-CM | POA: Diagnosis not present

## 2017-01-21 DIAGNOSIS — F251 Schizoaffective disorder, depressive type: Secondary | ICD-10-CM | POA: Diagnosis not present

## 2017-01-31 DIAGNOSIS — F250 Schizoaffective disorder, bipolar type: Secondary | ICD-10-CM | POA: Diagnosis not present

## 2017-04-29 DIAGNOSIS — F250 Schizoaffective disorder, bipolar type: Secondary | ICD-10-CM | POA: Diagnosis not present

## 2017-05-24 DIAGNOSIS — M79672 Pain in left foot: Secondary | ICD-10-CM | POA: Diagnosis not present

## 2017-05-24 DIAGNOSIS — F251 Schizoaffective disorder, depressive type: Secondary | ICD-10-CM | POA: Diagnosis not present

## 2017-05-24 DIAGNOSIS — F321 Major depressive disorder, single episode, moderate: Secondary | ICD-10-CM | POA: Diagnosis not present

## 2017-05-28 DIAGNOSIS — F250 Schizoaffective disorder, bipolar type: Secondary | ICD-10-CM | POA: Diagnosis not present

## 2017-07-02 DIAGNOSIS — F250 Schizoaffective disorder, bipolar type: Secondary | ICD-10-CM | POA: Diagnosis not present

## 2017-09-10 DIAGNOSIS — F250 Schizoaffective disorder, bipolar type: Secondary | ICD-10-CM | POA: Diagnosis not present

## 2017-09-17 DIAGNOSIS — F250 Schizoaffective disorder, bipolar type: Secondary | ICD-10-CM | POA: Diagnosis not present

## 2017-10-18 DIAGNOSIS — F250 Schizoaffective disorder, bipolar type: Secondary | ICD-10-CM | POA: Diagnosis not present

## 2017-11-04 DIAGNOSIS — M79672 Pain in left foot: Secondary | ICD-10-CM | POA: Diagnosis not present

## 2017-11-04 DIAGNOSIS — F251 Schizoaffective disorder, depressive type: Secondary | ICD-10-CM | POA: Diagnosis not present

## 2017-11-04 DIAGNOSIS — F321 Major depressive disorder, single episode, moderate: Secondary | ICD-10-CM | POA: Diagnosis not present

## 2017-11-05 DIAGNOSIS — E785 Hyperlipidemia, unspecified: Secondary | ICD-10-CM | POA: Diagnosis not present

## 2017-11-05 DIAGNOSIS — N429 Disorder of prostate, unspecified: Secondary | ICD-10-CM | POA: Diagnosis not present

## 2017-11-05 DIAGNOSIS — E559 Vitamin D deficiency, unspecified: Secondary | ICD-10-CM | POA: Diagnosis not present

## 2017-11-15 DIAGNOSIS — J40 Bronchitis, not specified as acute or chronic: Secondary | ICD-10-CM | POA: Diagnosis not present

## 2017-11-18 DIAGNOSIS — F250 Schizoaffective disorder, bipolar type: Secondary | ICD-10-CM | POA: Diagnosis not present

## 2017-12-09 DIAGNOSIS — Z79899 Other long term (current) drug therapy: Secondary | ICD-10-CM | POA: Diagnosis not present

## 2017-12-09 DIAGNOSIS — F251 Schizoaffective disorder, depressive type: Secondary | ICD-10-CM | POA: Diagnosis not present

## 2017-12-09 DIAGNOSIS — Z9181 History of falling: Secondary | ICD-10-CM | POA: Diagnosis not present

## 2017-12-09 DIAGNOSIS — Z1211 Encounter for screening for malignant neoplasm of colon: Secondary | ICD-10-CM | POA: Diagnosis not present

## 2017-12-09 DIAGNOSIS — Z87891 Personal history of nicotine dependence: Secondary | ICD-10-CM | POA: Diagnosis not present

## 2017-12-09 DIAGNOSIS — B351 Tinea unguium: Secondary | ICD-10-CM | POA: Diagnosis not present

## 2017-12-09 DIAGNOSIS — Z1331 Encounter for screening for depression: Secondary | ICD-10-CM | POA: Diagnosis not present

## 2017-12-09 DIAGNOSIS — Z6829 Body mass index (BMI) 29.0-29.9, adult: Secondary | ICD-10-CM | POA: Diagnosis not present

## 2017-12-09 DIAGNOSIS — Z1159 Encounter for screening for other viral diseases: Secondary | ICD-10-CM | POA: Diagnosis not present

## 2017-12-09 DIAGNOSIS — F321 Major depressive disorder, single episode, moderate: Secondary | ICD-10-CM | POA: Diagnosis not present

## 2017-12-17 DIAGNOSIS — B182 Chronic viral hepatitis C: Secondary | ICD-10-CM | POA: Diagnosis not present

## 2017-12-18 DIAGNOSIS — Z1212 Encounter for screening for malignant neoplasm of rectum: Secondary | ICD-10-CM | POA: Diagnosis not present

## 2017-12-18 DIAGNOSIS — Z1211 Encounter for screening for malignant neoplasm of colon: Secondary | ICD-10-CM | POA: Diagnosis not present

## 2017-12-23 DIAGNOSIS — F250 Schizoaffective disorder, bipolar type: Secondary | ICD-10-CM | POA: Diagnosis not present

## 2018-01-13 ENCOUNTER — Ambulatory Visit: Payer: Medicare Other | Admitting: Podiatry

## 2018-01-17 DIAGNOSIS — R45851 Suicidal ideations: Secondary | ICD-10-CM | POA: Diagnosis not present

## 2018-01-17 DIAGNOSIS — F23 Brief psychotic disorder: Secondary | ICD-10-CM | POA: Diagnosis not present

## 2018-01-31 DIAGNOSIS — K219 Gastro-esophageal reflux disease without esophagitis: Secondary | ICD-10-CM | POA: Diagnosis not present

## 2018-01-31 DIAGNOSIS — B182 Chronic viral hepatitis C: Secondary | ICD-10-CM | POA: Diagnosis not present

## 2018-02-04 ENCOUNTER — Other Ambulatory Visit: Payer: Self-pay | Admitting: Nurse Practitioner

## 2018-02-04 DIAGNOSIS — B1920 Unspecified viral hepatitis C without hepatic coma: Secondary | ICD-10-CM

## 2018-02-10 ENCOUNTER — Ambulatory Visit: Payer: Medicare Other | Admitting: Podiatry

## 2018-02-14 ENCOUNTER — Encounter: Payer: Self-pay | Admitting: Sports Medicine

## 2018-02-14 ENCOUNTER — Ambulatory Visit (INDEPENDENT_AMBULATORY_CARE_PROVIDER_SITE_OTHER): Payer: Medicare Other | Admitting: Sports Medicine

## 2018-02-14 VITALS — BP 133/89 | HR 76 | Temp 98.7°F | Resp 16

## 2018-02-14 DIAGNOSIS — B182 Chronic viral hepatitis C: Secondary | ICD-10-CM

## 2018-02-14 DIAGNOSIS — M79676 Pain in unspecified toe(s): Secondary | ICD-10-CM | POA: Diagnosis not present

## 2018-02-14 DIAGNOSIS — B351 Tinea unguium: Secondary | ICD-10-CM | POA: Diagnosis not present

## 2018-02-14 NOTE — Progress Notes (Signed)
Subjective: Daryl Campbell is a 67 y.o. male patient seen today in office with complaint of mildly painful thickened and elongated toenails; unable to trim. Patient denies history of Diabetes, Neuropathy, or Vascular disease.  Patient is assisted by facility caregiver who admits that patient has a history of hep C.  No other acute issues noted at this time.  Review of Systems  All other systems reviewed and are negative.    Patient Active Problem List   Diagnosis Date Noted  . Memory difficulties 04/11/2016  . Onychomycosis with ingrown toenail 02/13/2016  . Left knee pain 02/13/2016  . Hx of gout 02/13/2016  . Hepatitis C, chronic ([place]) 02/13/2016  . Seizure disorder ([place]) 09/20/2011  . Undifferentiated schizophrenia ([place]) 09/20/2011    Current Outpatient Medications on File Prior to Visit  Medication Sig Dispense Refill  . acetaminophen (TYLENOL 8 HOUR) 650 MG CR tablet Take 1 tablet (650 mg total) by mouth every 8 (eight) hours as needed for pain (moderate pain).    Marland Kitchen aspirin EC 81 MG tablet Take 81 mg by mouth daily.    . bacitracin ointment Apply 1 application topically 2 (two) times daily. 15 g 1  . benztropine (COGENTIN) 0.5 MG tablet Take 1 tablet (0.5 mg total) by mouth 2 (two) times daily. 60 tablet 0  . COLCRYS 0.6 MG tablet Take 1 tablet (0.6 mg total) by mouth daily. 30 tablet 2  . Cyanocobalamin (B-12) 100 MCG TABS Take 1 mg by mouth daily.    . haloperidol (HALDOL) 10 MG tablet Take 1 tablet (10 mg total) by mouth at bedtime. 30 tablet 0  . haloperidol decanoate (HALDOL DECANOATE) 100 MG/ML injection Inject 200 mg into the muscle every 28 (twenty-eight) days.    Marland Kitchen lithium carbonate 300 MG capsule Take 1 capsule (300 mg total) by mouth 2 (two) times daily. 60 capsule 0  . methocarbamol (ROBAXIN) 500 MG tablet Take 1 tablet (500 mg total) by mouth 3 (three) times daily. 30 tablet 1  . mirtazapine (REMERON) 15 MG tablet Take 1 tablet (15 mg total) by mouth at bedtime. 30  tablet 0  . QUEtiapine Fumarate (SEROQUEL XR) 150 MG 24 hr tablet Take 1 tablet (150 mg total) by mouth at bedtime. 30 tablet 0  . traZODone (DESYREL) 50 MG tablet Take 50 mg by mouth at bedtime.     No current facility-administered medications on file prior to visit.     No Known Allergies  Objective: Physical Exam  General: Well developed, nourished, no acute distress, awake, alert and oriented x 3  Vascular: Dorsalis pedis artery 2/4 bilateral, Posterior tibial artery 1/4 bilateral, skin temperature warm to warm proximal to distal bilateral lower extremities, no varicosities, pedal hair present bilateral.  Neurological: Gross sensation present via light touch bilateral.   Dermatological: Skin is warm, dry, and supple bilateral, Nails 1-5 on right and 3-5 on left are tender, long, thick, and discolored with mild subungal debris, patient has no nail on the left first and second toes after he lost them from accident the pulling them off when putting on his sock, no webspace macerations present bilateral, no open lesions present bilateral, mild sub met 5 callus/hyperkeratotic tissue present bilateral. No signs of infection bilateral.  Musculoskeletal: No symptomatic boney deformities noted bilateral. Muscular strength within normal limits without painon range of motion. No pain with calf compression bilateral.  Assessment and Plan:  Problem List Items Addressed This Visit      Digestive   Hepatitis  C, chronic (HCC) (Chronic)    Other Visit Diagnoses    Pain due to onychomycosis of toenail    -  Primary     -Examined patient.  -Discussed treatment options for painful mycotic nails. -Mechanically debrided and reduced mycotic nails with sterile nail nipper and dremel nail file without incident. -Patient to return in 3 months for follow up evaluation or sooner if symptoms worsen.  Landis Martins, DPM

## 2018-02-14 NOTE — Addendum Note (Signed)
Addended by: Allean Found on: 02/14/2018 12:18 PM   Modules accepted: Orders

## 2018-02-14 NOTE — Progress Notes (Signed)
   Subjective:    Patient ID: Daryl Campbell, male    DOB: 1951/04/07, 67 y.o.   MRN: [id number]  HPI    Review of Systems  All other systems reviewed and are negative.      Objective:   Physical Exam        Assessment & Plan:

## 2018-02-18 DIAGNOSIS — F250 Schizoaffective disorder, bipolar type: Secondary | ICD-10-CM | POA: Diagnosis not present

## 2018-03-10 DIAGNOSIS — F321 Major depressive disorder, single episode, moderate: Secondary | ICD-10-CM | POA: Diagnosis not present

## 2018-03-10 DIAGNOSIS — F251 Schizoaffective disorder, depressive type: Secondary | ICD-10-CM | POA: Diagnosis not present

## 2018-03-10 DIAGNOSIS — Z1159 Encounter for screening for other viral diseases: Secondary | ICD-10-CM | POA: Diagnosis not present

## 2018-03-10 DIAGNOSIS — B182 Chronic viral hepatitis C: Secondary | ICD-10-CM | POA: Diagnosis not present

## 2018-03-10 DIAGNOSIS — Z139 Encounter for screening, unspecified: Secondary | ICD-10-CM | POA: Diagnosis not present

## 2018-03-10 DIAGNOSIS — Z0000 Encounter for general adult medical examination without abnormal findings: Secondary | ICD-10-CM | POA: Diagnosis not present

## 2018-03-10 DIAGNOSIS — Z125 Encounter for screening for malignant neoplasm of prostate: Secondary | ICD-10-CM | POA: Diagnosis not present

## 2018-03-10 DIAGNOSIS — Z23 Encounter for immunization: Secondary | ICD-10-CM | POA: Diagnosis not present

## 2018-03-10 DIAGNOSIS — Z79899 Other long term (current) drug therapy: Secondary | ICD-10-CM | POA: Diagnosis not present

## 2018-03-12 ENCOUNTER — Ambulatory Visit
Admission: RE | Admit: 2018-03-12 | Discharge: 2018-03-12 | Disposition: A | Discharge status: Home / Self Care | Payer: Medicare Other | Source: Ambulatory Visit | Attending: Nurse Practitioner | Admitting: Nurse Practitioner

## 2018-03-12 DIAGNOSIS — B1920 Unspecified viral hepatitis C without hepatic coma: Secondary | ICD-10-CM

## 2018-03-12 DIAGNOSIS — B182 Chronic viral hepatitis C: Secondary | ICD-10-CM | POA: Diagnosis not present

## 2018-03-18 DIAGNOSIS — K740 Hepatic fibrosis: Secondary | ICD-10-CM | POA: Diagnosis not present

## 2018-03-18 DIAGNOSIS — B182 Chronic viral hepatitis C: Secondary | ICD-10-CM | POA: Diagnosis not present

## 2018-04-16 DIAGNOSIS — F250 Schizoaffective disorder, bipolar type: Secondary | ICD-10-CM | POA: Diagnosis not present

## 2018-04-18 DIAGNOSIS — B182 Chronic viral hepatitis C: Secondary | ICD-10-CM | POA: Diagnosis not present

## 2018-04-24 DIAGNOSIS — F209 Schizophrenia, unspecified: Secondary | ICD-10-CM | POA: Diagnosis not present

## 2018-05-16 ENCOUNTER — Ambulatory Visit: Payer: Medicare Other | Admitting: Sports Medicine

## 2018-05-19 DIAGNOSIS — B182 Chronic viral hepatitis C: Secondary | ICD-10-CM | POA: Diagnosis not present

## 2018-06-12 DIAGNOSIS — F29 Unspecified psychosis not due to a substance or known physiological condition: Secondary | ICD-10-CM | POA: Diagnosis not present

## 2018-06-23 DIAGNOSIS — B182 Chronic viral hepatitis C: Secondary | ICD-10-CM | POA: Diagnosis not present

## 2018-06-23 DIAGNOSIS — F251 Schizoaffective disorder, depressive type: Secondary | ICD-10-CM | POA: Diagnosis not present

## 2018-06-23 DIAGNOSIS — Z23 Encounter for immunization: Secondary | ICD-10-CM | POA: Diagnosis not present

## 2018-06-23 DIAGNOSIS — Z1339 Encounter for screening examination for other mental health and behavioral disorders: Secondary | ICD-10-CM | POA: Diagnosis not present

## 2018-06-23 DIAGNOSIS — F321 Major depressive disorder, single episode, moderate: Secondary | ICD-10-CM | POA: Diagnosis not present

## 2018-06-23 DIAGNOSIS — K219 Gastro-esophageal reflux disease without esophagitis: Secondary | ICD-10-CM | POA: Diagnosis not present

## 2018-07-09 DIAGNOSIS — F250 Schizoaffective disorder, bipolar type: Secondary | ICD-10-CM | POA: Diagnosis not present

## 2018-08-07 DIAGNOSIS — B182 Chronic viral hepatitis C: Secondary | ICD-10-CM | POA: Diagnosis not present

## 2018-08-12 DIAGNOSIS — Z23 Encounter for immunization: Secondary | ICD-10-CM | POA: Diagnosis not present

## 2018-08-12 DIAGNOSIS — F319 Bipolar disorder, unspecified: Secondary | ICD-10-CM | POA: Diagnosis not present

## 2018-08-12 DIAGNOSIS — R440 Auditory hallucinations: Secondary | ICD-10-CM | POA: Diagnosis not present

## 2018-08-12 DIAGNOSIS — F209 Schizophrenia, unspecified: Secondary | ICD-10-CM | POA: Diagnosis not present

## 2018-08-18 DIAGNOSIS — F250 Schizoaffective disorder, bipolar type: Secondary | ICD-10-CM | POA: Diagnosis not present

## 2018-09-02 DIAGNOSIS — F250 Schizoaffective disorder, bipolar type: Secondary | ICD-10-CM | POA: Diagnosis not present

## 2018-09-10 DIAGNOSIS — F321 Major depressive disorder, single episode, moderate: Secondary | ICD-10-CM | POA: Diagnosis not present

## 2018-09-10 DIAGNOSIS — B182 Chronic viral hepatitis C: Secondary | ICD-10-CM | POA: Diagnosis not present

## 2018-09-10 DIAGNOSIS — K219 Gastro-esophageal reflux disease without esophagitis: Secondary | ICD-10-CM | POA: Diagnosis not present

## 2018-09-10 DIAGNOSIS — Z79899 Other long term (current) drug therapy: Secondary | ICD-10-CM | POA: Diagnosis not present

## 2018-09-10 DIAGNOSIS — R946 Abnormal results of thyroid function studies: Secondary | ICD-10-CM | POA: Diagnosis not present

## 2018-09-10 DIAGNOSIS — F251 Schizoaffective disorder, depressive type: Secondary | ICD-10-CM | POA: Diagnosis not present

## 2018-10-08 DIAGNOSIS — F250 Schizoaffective disorder, bipolar type: Secondary | ICD-10-CM | POA: Diagnosis not present

## 2018-10-14 DIAGNOSIS — B182 Chronic viral hepatitis C: Secondary | ICD-10-CM | POA: Diagnosis not present

## 2018-10-14 DIAGNOSIS — K740 Hepatic fibrosis: Secondary | ICD-10-CM | POA: Diagnosis not present

## 2018-10-30 DIAGNOSIS — Z79899 Other long term (current) drug therapy: Secondary | ICD-10-CM | POA: Diagnosis not present

## 2018-11-19 DIAGNOSIS — F250 Schizoaffective disorder, bipolar type: Secondary | ICD-10-CM | POA: Diagnosis not present

## 2019-03-11 DIAGNOSIS — Z79899 Other long term (current) drug therapy: Secondary | ICD-10-CM | POA: Diagnosis not present

## 2019-03-12 DIAGNOSIS — K219 Gastro-esophageal reflux disease without esophagitis: Secondary | ICD-10-CM | POA: Diagnosis not present

## 2019-03-12 DIAGNOSIS — Z9181 History of falling: Secondary | ICD-10-CM | POA: Diagnosis not present

## 2019-03-12 DIAGNOSIS — F251 Schizoaffective disorder, depressive type: Secondary | ICD-10-CM | POA: Diagnosis not present

## 2019-03-12 DIAGNOSIS — B182 Chronic viral hepatitis C: Secondary | ICD-10-CM | POA: Diagnosis not present

## 2019-03-12 DIAGNOSIS — F321 Major depressive disorder, single episode, moderate: Secondary | ICD-10-CM | POA: Diagnosis not present

## 2019-03-16 DIAGNOSIS — Z125 Encounter for screening for malignant neoplasm of prostate: Secondary | ICD-10-CM | POA: Diagnosis not present

## 2019-03-16 DIAGNOSIS — Z139 Encounter for screening, unspecified: Secondary | ICD-10-CM | POA: Diagnosis not present

## 2019-03-16 DIAGNOSIS — Z9181 History of falling: Secondary | ICD-10-CM | POA: Diagnosis not present

## 2019-03-16 DIAGNOSIS — Z0000 Encounter for general adult medical examination without abnormal findings: Secondary | ICD-10-CM | POA: Diagnosis not present

## 2019-03-16 DIAGNOSIS — Z1331 Encounter for screening for depression: Secondary | ICD-10-CM | POA: Diagnosis not present

## 2019-03-16 DIAGNOSIS — Z6830 Body mass index (BMI) 30.0-30.9, adult: Secondary | ICD-10-CM | POA: Diagnosis not present

## 2019-03-26 DIAGNOSIS — F250 Schizoaffective disorder, bipolar type: Secondary | ICD-10-CM | POA: Diagnosis not present

## 2019-04-16 DIAGNOSIS — F250 Schizoaffective disorder, bipolar type: Secondary | ICD-10-CM | POA: Diagnosis not present

## 2019-04-21 DIAGNOSIS — M2041 Other hammer toe(s) (acquired), right foot: Secondary | ICD-10-CM | POA: Diagnosis not present

## 2019-04-21 DIAGNOSIS — D2372 Other benign neoplasm of skin of left lower limb, including hip: Secondary | ICD-10-CM | POA: Diagnosis not present

## 2019-04-21 DIAGNOSIS — B351 Tinea unguium: Secondary | ICD-10-CM | POA: Diagnosis not present

## 2019-04-21 DIAGNOSIS — M79674 Pain in right toe(s): Secondary | ICD-10-CM | POA: Diagnosis not present

## 2019-04-21 DIAGNOSIS — D2371 Other benign neoplasm of skin of right lower limb, including hip: Secondary | ICD-10-CM | POA: Diagnosis not present

## 2019-05-07 DIAGNOSIS — F250 Schizoaffective disorder, bipolar type: Secondary | ICD-10-CM | POA: Diagnosis not present

## 2019-05-28 DIAGNOSIS — F29 Unspecified psychosis not due to a substance or known physiological condition: Secondary | ICD-10-CM | POA: Diagnosis not present

## 2019-05-31 DIAGNOSIS — R52 Pain, unspecified: Secondary | ICD-10-CM | POA: Diagnosis not present

## 2019-05-31 DIAGNOSIS — R410 Disorientation, unspecified: Secondary | ICD-10-CM | POA: Diagnosis not present

## 2019-05-31 DIAGNOSIS — F259 Schizoaffective disorder, unspecified: Secondary | ICD-10-CM | POA: Diagnosis not present

## 2019-05-31 DIAGNOSIS — R1084 Generalized abdominal pain: Secondary | ICD-10-CM | POA: Diagnosis not present

## 2019-05-31 DIAGNOSIS — R0902 Hypoxemia: Secondary | ICD-10-CM | POA: Diagnosis not present

## 2019-05-31 DIAGNOSIS — R531 Weakness: Secondary | ICD-10-CM | POA: Diagnosis not present

## 2019-05-31 DIAGNOSIS — R079 Chest pain, unspecified: Secondary | ICD-10-CM | POA: Diagnosis not present

## 2019-06-18 DIAGNOSIS — F250 Schizoaffective disorder, bipolar type: Secondary | ICD-10-CM | POA: Diagnosis not present

## 2019-07-06 DIAGNOSIS — F250 Schizoaffective disorder, bipolar type: Secondary | ICD-10-CM | POA: Diagnosis not present

## 2019-07-14 DIAGNOSIS — Z23 Encounter for immunization: Secondary | ICD-10-CM | POA: Diagnosis not present

## 2019-07-28 DIAGNOSIS — F250 Schizoaffective disorder, bipolar type: Secondary | ICD-10-CM | POA: Diagnosis not present

## 2019-07-31 DIAGNOSIS — M79602 Pain in left arm: Secondary | ICD-10-CM | POA: Diagnosis not present

## 2019-08-04 DIAGNOSIS — D2371 Other benign neoplasm of skin of right lower limb, including hip: Secondary | ICD-10-CM | POA: Diagnosis not present

## 2019-08-04 DIAGNOSIS — M2041 Other hammer toe(s) (acquired), right foot: Secondary | ICD-10-CM | POA: Diagnosis not present

## 2019-08-04 DIAGNOSIS — B351 Tinea unguium: Secondary | ICD-10-CM | POA: Diagnosis not present

## 2019-08-04 DIAGNOSIS — D2372 Other benign neoplasm of skin of left lower limb, including hip: Secondary | ICD-10-CM | POA: Diagnosis not present

## 2019-08-04 DIAGNOSIS — M79674 Pain in right toe(s): Secondary | ICD-10-CM | POA: Diagnosis not present

## 2019-08-20 DIAGNOSIS — F250 Schizoaffective disorder, bipolar type: Secondary | ICD-10-CM | POA: Diagnosis not present

## 2019-09-10 DIAGNOSIS — F29 Unspecified psychosis not due to a substance or known physiological condition: Secondary | ICD-10-CM | POA: Diagnosis not present

## 2019-09-30 DIAGNOSIS — F250 Schizoaffective disorder, bipolar type: Secondary | ICD-10-CM | POA: Diagnosis not present

## 2019-10-23 DIAGNOSIS — F250 Schizoaffective disorder, bipolar type: Secondary | ICD-10-CM | POA: Diagnosis not present

## 2019-11-12 DIAGNOSIS — F29 Unspecified psychosis not due to a substance or known physiological condition: Secondary | ICD-10-CM | POA: Diagnosis not present

## 2019-11-17 DIAGNOSIS — D2372 Other benign neoplasm of skin of left lower limb, including hip: Secondary | ICD-10-CM | POA: Diagnosis not present

## 2019-11-17 DIAGNOSIS — D2371 Other benign neoplasm of skin of right lower limb, including hip: Secondary | ICD-10-CM | POA: Diagnosis not present

## 2019-11-17 DIAGNOSIS — R413 Other amnesia: Secondary | ICD-10-CM | POA: Diagnosis not present

## 2019-11-17 DIAGNOSIS — M2041 Other hammer toe(s) (acquired), right foot: Secondary | ICD-10-CM | POA: Diagnosis not present

## 2019-11-17 DIAGNOSIS — E039 Hypothyroidism, unspecified: Secondary | ICD-10-CM | POA: Diagnosis not present

## 2019-11-17 DIAGNOSIS — B351 Tinea unguium: Secondary | ICD-10-CM | POA: Diagnosis not present

## 2019-11-17 DIAGNOSIS — M79674 Pain in right toe(s): Secondary | ICD-10-CM | POA: Diagnosis not present

## 2019-12-03 DIAGNOSIS — F250 Schizoaffective disorder, bipolar type: Secondary | ICD-10-CM | POA: Diagnosis not present

## 2019-12-23 DIAGNOSIS — F250 Schizoaffective disorder, bipolar type: Secondary | ICD-10-CM | POA: Diagnosis not present

## 2020-01-11 DIAGNOSIS — E039 Hypothyroidism, unspecified: Secondary | ICD-10-CM | POA: Diagnosis not present

## 2020-01-11 DIAGNOSIS — Z79899 Other long term (current) drug therapy: Secondary | ICD-10-CM | POA: Diagnosis not present

## 2020-01-14 DIAGNOSIS — F29 Unspecified psychosis not due to a substance or known physiological condition: Secondary | ICD-10-CM | POA: Diagnosis not present

## 2020-02-03 DIAGNOSIS — Z23 Encounter for immunization: Secondary | ICD-10-CM | POA: Diagnosis not present

## 2020-03-01 DIAGNOSIS — M79674 Pain in right toe(s): Secondary | ICD-10-CM | POA: Diagnosis not present

## 2020-03-01 DIAGNOSIS — B351 Tinea unguium: Secondary | ICD-10-CM | POA: Diagnosis not present

## 2020-03-01 DIAGNOSIS — M2041 Other hammer toe(s) (acquired), right foot: Secondary | ICD-10-CM | POA: Diagnosis not present

## 2020-03-01 DIAGNOSIS — D2372 Other benign neoplasm of skin of left lower limb, including hip: Secondary | ICD-10-CM | POA: Diagnosis not present

## 2020-03-01 DIAGNOSIS — D2371 Other benign neoplasm of skin of right lower limb, including hip: Secondary | ICD-10-CM | POA: Diagnosis not present

## 2020-03-16 DIAGNOSIS — F250 Schizoaffective disorder, bipolar type: Secondary | ICD-10-CM | POA: Diagnosis not present

## 2020-03-21 DIAGNOSIS — E785 Hyperlipidemia, unspecified: Secondary | ICD-10-CM | POA: Diagnosis not present

## 2020-03-21 DIAGNOSIS — Z6830 Body mass index (BMI) 30.0-30.9, adult: Secondary | ICD-10-CM | POA: Diagnosis not present

## 2020-03-21 DIAGNOSIS — Z139 Encounter for screening, unspecified: Secondary | ICD-10-CM | POA: Diagnosis not present

## 2020-03-21 DIAGNOSIS — Z1331 Encounter for screening for depression: Secondary | ICD-10-CM | POA: Diagnosis not present

## 2020-03-21 DIAGNOSIS — Z9181 History of falling: Secondary | ICD-10-CM | POA: Diagnosis not present

## 2020-03-21 DIAGNOSIS — Z0000 Encounter for general adult medical examination without abnormal findings: Secondary | ICD-10-CM | POA: Diagnosis not present

## 2020-03-31 DIAGNOSIS — F259 Schizoaffective disorder, unspecified: Secondary | ICD-10-CM | POA: Diagnosis not present

## 2020-04-05 DIAGNOSIS — I10 Essential (primary) hypertension: Secondary | ICD-10-CM | POA: Diagnosis not present

## 2020-04-05 DIAGNOSIS — K219 Gastro-esophageal reflux disease without esophagitis: Secondary | ICD-10-CM | POA: Diagnosis not present

## 2020-04-05 DIAGNOSIS — E782 Mixed hyperlipidemia: Secondary | ICD-10-CM | POA: Diagnosis not present

## 2020-04-05 DIAGNOSIS — B351 Tinea unguium: Secondary | ICD-10-CM | POA: Diagnosis not present

## 2020-04-05 DIAGNOSIS — E039 Hypothyroidism, unspecified: Secondary | ICD-10-CM | POA: Diagnosis not present

## 2020-04-07 DIAGNOSIS — Z79899 Other long term (current) drug therapy: Secondary | ICD-10-CM | POA: Diagnosis not present

## 2020-04-07 DIAGNOSIS — F259 Schizoaffective disorder, unspecified: Secondary | ICD-10-CM | POA: Diagnosis not present

## 2020-04-19 DIAGNOSIS — Z20828 Contact with and (suspected) exposure to other viral communicable diseases: Secondary | ICD-10-CM | POA: Diagnosis not present

## 2020-04-19 DIAGNOSIS — Z1159 Encounter for screening for other viral diseases: Secondary | ICD-10-CM | POA: Diagnosis not present

## 2020-04-28 DIAGNOSIS — F259 Schizoaffective disorder, unspecified: Secondary | ICD-10-CM | POA: Diagnosis not present

## 2020-04-29 DIAGNOSIS — F259 Schizoaffective disorder, unspecified: Secondary | ICD-10-CM | POA: Diagnosis not present

## 2020-04-29 DIAGNOSIS — Z79899 Other long term (current) drug therapy: Secondary | ICD-10-CM | POA: Diagnosis not present

## 2020-05-03 DIAGNOSIS — E039 Hypothyroidism, unspecified: Secondary | ICD-10-CM | POA: Diagnosis not present

## 2020-05-03 DIAGNOSIS — K219 Gastro-esophageal reflux disease without esophagitis: Secondary | ICD-10-CM | POA: Diagnosis not present

## 2020-05-03 DIAGNOSIS — R300 Dysuria: Secondary | ICD-10-CM | POA: Diagnosis not present

## 2020-05-03 DIAGNOSIS — I10 Essential (primary) hypertension: Secondary | ICD-10-CM | POA: Diagnosis not present

## 2020-05-04 DIAGNOSIS — K219 Gastro-esophageal reflux disease without esophagitis: Secondary | ICD-10-CM | POA: Diagnosis not present

## 2020-05-04 DIAGNOSIS — I10 Essential (primary) hypertension: Secondary | ICD-10-CM | POA: Diagnosis not present

## 2020-05-04 DIAGNOSIS — E039 Hypothyroidism, unspecified: Secondary | ICD-10-CM | POA: Diagnosis not present

## 2020-05-04 DIAGNOSIS — E782 Mixed hyperlipidemia: Secondary | ICD-10-CM | POA: Diagnosis not present

## 2020-05-07 DIAGNOSIS — Z79899 Other long term (current) drug therapy: Secondary | ICD-10-CM | POA: Diagnosis not present

## 2020-05-07 DIAGNOSIS — F259 Schizoaffective disorder, unspecified: Secondary | ICD-10-CM | POA: Diagnosis not present

## 2020-05-12 DIAGNOSIS — F259 Schizoaffective disorder, unspecified: Secondary | ICD-10-CM | POA: Diagnosis not present

## 2020-05-13 DIAGNOSIS — N390 Urinary tract infection, site not specified: Secondary | ICD-10-CM | POA: Diagnosis not present

## 2020-05-17 DIAGNOSIS — D518 Other vitamin B12 deficiency anemias: Secondary | ICD-10-CM | POA: Diagnosis not present

## 2020-05-17 DIAGNOSIS — E039 Hypothyroidism, unspecified: Secondary | ICD-10-CM | POA: Diagnosis not present

## 2020-05-17 DIAGNOSIS — E559 Vitamin D deficiency, unspecified: Secondary | ICD-10-CM | POA: Diagnosis not present

## 2020-05-17 DIAGNOSIS — Z79899 Other long term (current) drug therapy: Secondary | ICD-10-CM | POA: Diagnosis not present

## 2020-05-17 DIAGNOSIS — I10 Essential (primary) hypertension: Secondary | ICD-10-CM | POA: Diagnosis not present

## 2020-05-17 DIAGNOSIS — E119 Type 2 diabetes mellitus without complications: Secondary | ICD-10-CM | POA: Diagnosis not present

## 2020-05-17 DIAGNOSIS — E782 Mixed hyperlipidemia: Secondary | ICD-10-CM | POA: Diagnosis not present

## 2020-05-17 DIAGNOSIS — D509 Iron deficiency anemia, unspecified: Secondary | ICD-10-CM | POA: Diagnosis not present

## 2020-05-19 DIAGNOSIS — F259 Schizoaffective disorder, unspecified: Secondary | ICD-10-CM | POA: Diagnosis not present

## 2020-05-19 DIAGNOSIS — Z79899 Other long term (current) drug therapy: Secondary | ICD-10-CM | POA: Diagnosis not present

## 2020-05-20 DIAGNOSIS — Z79899 Other long term (current) drug therapy: Secondary | ICD-10-CM | POA: Diagnosis not present

## 2020-05-20 DIAGNOSIS — F259 Schizoaffective disorder, unspecified: Secondary | ICD-10-CM | POA: Diagnosis not present

## 2020-05-24 DIAGNOSIS — L089 Local infection of the skin and subcutaneous tissue, unspecified: Secondary | ICD-10-CM | POA: Diagnosis not present

## 2020-05-25 DIAGNOSIS — F259 Schizoaffective disorder, unspecified: Secondary | ICD-10-CM | POA: Diagnosis not present

## 2020-05-26 DIAGNOSIS — F259 Schizoaffective disorder, unspecified: Secondary | ICD-10-CM | POA: Diagnosis not present

## 2020-05-26 DIAGNOSIS — Z79899 Other long term (current) drug therapy: Secondary | ICD-10-CM | POA: Diagnosis not present

## 2020-05-31 DIAGNOSIS — E039 Hypothyroidism, unspecified: Secondary | ICD-10-CM | POA: Diagnosis not present

## 2020-05-31 DIAGNOSIS — K219 Gastro-esophageal reflux disease without esophagitis: Secondary | ICD-10-CM | POA: Diagnosis not present

## 2020-05-31 DIAGNOSIS — L089 Local infection of the skin and subcutaneous tissue, unspecified: Secondary | ICD-10-CM | POA: Diagnosis not present

## 2020-06-02 DIAGNOSIS — F259 Schizoaffective disorder, unspecified: Secondary | ICD-10-CM | POA: Diagnosis not present

## 2020-06-02 DIAGNOSIS — L603 Nail dystrophy: Secondary | ICD-10-CM | POA: Diagnosis not present

## 2020-06-02 DIAGNOSIS — Z79899 Other long term (current) drug therapy: Secondary | ICD-10-CM | POA: Diagnosis not present

## 2020-06-02 DIAGNOSIS — I739 Peripheral vascular disease, unspecified: Secondary | ICD-10-CM | POA: Diagnosis not present

## 2020-06-02 DIAGNOSIS — Q845 Enlarged and hypertrophic nails: Secondary | ICD-10-CM | POA: Diagnosis not present

## 2020-06-23 DIAGNOSIS — F259 Schizoaffective disorder, unspecified: Secondary | ICD-10-CM | POA: Diagnosis not present

## 2020-06-25 DIAGNOSIS — F259 Schizoaffective disorder, unspecified: Secondary | ICD-10-CM | POA: Diagnosis not present

## 2020-06-28 DIAGNOSIS — K219 Gastro-esophageal reflux disease without esophagitis: Secondary | ICD-10-CM | POA: Diagnosis not present

## 2020-06-28 DIAGNOSIS — E039 Hypothyroidism, unspecified: Secondary | ICD-10-CM | POA: Diagnosis not present

## 2020-06-28 DIAGNOSIS — I10 Essential (primary) hypertension: Secondary | ICD-10-CM | POA: Diagnosis not present

## 2020-07-01 DIAGNOSIS — Z79899 Other long term (current) drug therapy: Secondary | ICD-10-CM | POA: Diagnosis not present

## 2020-07-21 DIAGNOSIS — F259 Schizoaffective disorder, unspecified: Secondary | ICD-10-CM | POA: Diagnosis not present

## 2020-07-25 DIAGNOSIS — F259 Schizoaffective disorder, unspecified: Secondary | ICD-10-CM | POA: Diagnosis not present

## 2020-07-25 DIAGNOSIS — R42 Dizziness and giddiness: Secondary | ICD-10-CM | POA: Diagnosis not present

## 2020-07-25 DIAGNOSIS — G40909 Epilepsy, unspecified, not intractable, without status epilepticus: Secondary | ICD-10-CM | POA: Diagnosis not present

## 2020-07-25 DIAGNOSIS — I739 Peripheral vascular disease, unspecified: Secondary | ICD-10-CM | POA: Diagnosis not present

## 2020-07-25 DIAGNOSIS — F319 Bipolar disorder, unspecified: Secondary | ICD-10-CM | POA: Diagnosis not present

## 2020-07-25 DIAGNOSIS — F4310 Post-traumatic stress disorder, unspecified: Secondary | ICD-10-CM | POA: Diagnosis not present

## 2020-07-26 DIAGNOSIS — I10 Essential (primary) hypertension: Secondary | ICD-10-CM | POA: Diagnosis not present

## 2020-07-26 DIAGNOSIS — E782 Mixed hyperlipidemia: Secondary | ICD-10-CM | POA: Diagnosis not present

## 2020-07-26 DIAGNOSIS — K219 Gastro-esophageal reflux disease without esophagitis: Secondary | ICD-10-CM | POA: Diagnosis not present

## 2020-08-01 DIAGNOSIS — Z79899 Other long term (current) drug therapy: Secondary | ICD-10-CM | POA: Diagnosis not present

## 2020-08-01 DIAGNOSIS — F259 Schizoaffective disorder, unspecified: Secondary | ICD-10-CM | POA: Diagnosis not present

## 2020-08-11 DIAGNOSIS — Z79899 Other long term (current) drug therapy: Secondary | ICD-10-CM | POA: Diagnosis not present

## 2020-08-12 DIAGNOSIS — F319 Bipolar disorder, unspecified: Secondary | ICD-10-CM | POA: Diagnosis not present

## 2020-08-12 DIAGNOSIS — F259 Schizoaffective disorder, unspecified: Secondary | ICD-10-CM | POA: Diagnosis not present

## 2020-08-12 DIAGNOSIS — R42 Dizziness and giddiness: Secondary | ICD-10-CM | POA: Diagnosis not present

## 2020-08-18 DIAGNOSIS — F259 Schizoaffective disorder, unspecified: Secondary | ICD-10-CM | POA: Diagnosis not present

## 2020-08-24 DIAGNOSIS — F259 Schizoaffective disorder, unspecified: Secondary | ICD-10-CM | POA: Diagnosis not present

## 2020-08-30 DIAGNOSIS — E039 Hypothyroidism, unspecified: Secondary | ICD-10-CM | POA: Diagnosis not present

## 2020-08-30 DIAGNOSIS — K219 Gastro-esophageal reflux disease without esophagitis: Secondary | ICD-10-CM | POA: Diagnosis not present

## 2020-08-30 DIAGNOSIS — I10 Essential (primary) hypertension: Secondary | ICD-10-CM | POA: Diagnosis not present

## 2020-08-30 DIAGNOSIS — R197 Diarrhea, unspecified: Secondary | ICD-10-CM | POA: Diagnosis not present

## 2020-09-15 DIAGNOSIS — F259 Schizoaffective disorder, unspecified: Secondary | ICD-10-CM | POA: Diagnosis not present

## 2020-09-27 DIAGNOSIS — I10 Essential (primary) hypertension: Secondary | ICD-10-CM | POA: Diagnosis not present

## 2020-09-27 DIAGNOSIS — E782 Mixed hyperlipidemia: Secondary | ICD-10-CM | POA: Diagnosis not present

## 2020-09-27 DIAGNOSIS — K219 Gastro-esophageal reflux disease without esophagitis: Secondary | ICD-10-CM | POA: Diagnosis not present
